# Patient Record
Sex: Male | Born: 1945 | Race: White | Hispanic: No | Marital: Married | Smoking: Former smoker
Health system: Southern US, Community
[De-identification: ages and names within clinical notes are randomized; demographics above are authoritative.]

## PROBLEM LIST (undated history)

## (undated) DIAGNOSIS — I10 Essential (primary) hypertension: Secondary | ICD-10-CM

## (undated) DIAGNOSIS — M109 Gout, unspecified: Secondary | ICD-10-CM

## (undated) DIAGNOSIS — Z9289 Personal history of other medical treatment: Secondary | ICD-10-CM

## (undated) DIAGNOSIS — J449 Chronic obstructive pulmonary disease, unspecified: Secondary | ICD-10-CM

## (undated) DIAGNOSIS — E119 Type 2 diabetes mellitus without complications: Secondary | ICD-10-CM

## (undated) DIAGNOSIS — C3411 Malignant neoplasm of upper lobe, right bronchus or lung: Secondary | ICD-10-CM

## (undated) DIAGNOSIS — F1010 Alcohol abuse, uncomplicated: Secondary | ICD-10-CM

## (undated) DIAGNOSIS — E7800 Pure hypercholesterolemia, unspecified: Secondary | ICD-10-CM

## (undated) HISTORY — PX: COLONOSCOPY: SHX174

## (undated) HISTORY — PX: NOSE SURGERY: SHX723

## (undated) HISTORY — DX: Personal history of other medical treatment: Z92.89

## (undated) HISTORY — DX: Chronic obstructive pulmonary disease, unspecified: J44.9

## (undated) HISTORY — DX: Type 2 diabetes mellitus without complications: E11.9

## (undated) HISTORY — DX: Gout, unspecified: M10.9

---

## 2001-02-28 ENCOUNTER — Emergency Department (HOSPITAL_COMMUNITY): Admission: EM | Admit: 2001-02-28 | Discharge: 2001-02-28 | Payer: Self-pay | Admitting: Emergency Medicine

## 2001-02-28 ENCOUNTER — Encounter: Payer: Self-pay | Admitting: Emergency Medicine

## 2002-04-09 ENCOUNTER — Encounter: Payer: Self-pay | Admitting: Emergency Medicine

## 2002-04-10 ENCOUNTER — Encounter: Payer: Self-pay | Admitting: Internal Medicine

## 2002-04-10 ENCOUNTER — Inpatient Hospital Stay (HOSPITAL_COMMUNITY): Admission: EM | Admit: 2002-04-10 | Discharge: 2002-04-16 | Payer: Self-pay | Admitting: Emergency Medicine

## 2002-04-14 ENCOUNTER — Encounter: Payer: Self-pay | Admitting: Internal Medicine

## 2002-06-05 ENCOUNTER — Encounter: Admission: RE | Admit: 2002-06-05 | Discharge: 2002-06-05 | Payer: Self-pay | Admitting: Family Medicine

## 2002-06-05 ENCOUNTER — Encounter: Payer: Self-pay | Admitting: Family Medicine

## 2003-02-09 ENCOUNTER — Encounter: Admission: RE | Admit: 2003-02-09 | Discharge: 2003-02-09 | Payer: Self-pay | Admitting: Internal Medicine

## 2003-02-09 ENCOUNTER — Encounter: Payer: Self-pay | Admitting: Internal Medicine

## 2003-02-10 ENCOUNTER — Encounter: Payer: Self-pay | Admitting: Internal Medicine

## 2003-02-10 ENCOUNTER — Encounter: Admission: RE | Admit: 2003-02-10 | Discharge: 2003-02-10 | Payer: Self-pay | Admitting: Internal Medicine

## 2003-09-30 ENCOUNTER — Encounter: Admission: RE | Admit: 2003-09-30 | Discharge: 2003-09-30 | Payer: Self-pay | Admitting: Family Medicine

## 2005-07-04 ENCOUNTER — Ambulatory Visit: Payer: Self-pay | Admitting: Internal Medicine

## 2005-07-28 ENCOUNTER — Encounter: Payer: Self-pay | Admitting: Internal Medicine

## 2005-08-21 ENCOUNTER — Inpatient Hospital Stay (HOSPITAL_COMMUNITY): Admission: EM | Admit: 2005-08-21 | Discharge: 2005-08-24 | Payer: Self-pay | Admitting: Emergency Medicine

## 2005-08-22 ENCOUNTER — Ambulatory Visit: Payer: Self-pay | Admitting: Internal Medicine

## 2005-08-28 ENCOUNTER — Other Ambulatory Visit (HOSPITAL_COMMUNITY): Admission: RE | Admit: 2005-08-28 | Discharge: 2005-09-07 | Payer: Self-pay | Admitting: Psychiatry

## 2005-08-28 ENCOUNTER — Ambulatory Visit: Payer: Self-pay | Admitting: Psychiatry

## 2005-08-29 ENCOUNTER — Ambulatory Visit: Payer: Self-pay | Admitting: Internal Medicine

## 2006-01-03 ENCOUNTER — Emergency Department (HOSPITAL_COMMUNITY): Admission: EM | Admit: 2006-01-03 | Discharge: 2006-01-03 | Payer: Self-pay | Admitting: Emergency Medicine

## 2006-01-03 ENCOUNTER — Emergency Department (HOSPITAL_COMMUNITY): Admission: EM | Admit: 2006-01-03 | Discharge: 2006-01-04 | Payer: Self-pay | Admitting: Emergency Medicine

## 2006-11-25 ENCOUNTER — Ambulatory Visit: Payer: Self-pay | Admitting: Internal Medicine

## 2006-11-28 ENCOUNTER — Ambulatory Visit: Payer: Self-pay | Admitting: Internal Medicine

## 2006-11-28 LAB — CONVERTED CEMR LAB
ALT: 79 units/L — ABNORMAL HIGH (ref 0–40)
Albumin: 3.9 g/dL (ref 3.5–5.2)
Alkaline Phosphatase: 49 units/L (ref 39–117)
BUN: 6 mg/dL (ref 6–23)
Basophils Absolute: 0.2 10*3/uL — ABNORMAL HIGH (ref 0.0–0.1)
Basophils Relative: 2.8 % — ABNORMAL HIGH (ref 0.0–1.0)
Calcium: 8.7 mg/dL (ref 8.4–10.5)
Chloride: 101 meq/L (ref 96–112)
Creatinine, Ser: 0.7 mg/dL (ref 0.4–1.5)
MCHC: 34 g/dL (ref 30.0–36.0)
Monocytes Absolute: 0.5 10*3/uL (ref 0.2–0.7)
Monocytes Relative: 7.4 % (ref 3.0–11.0)
Platelets: 141 10*3/uL — ABNORMAL LOW (ref 150–400)
Potassium: 3.2 meq/L — ABNORMAL LOW (ref 3.5–5.1)
RBC: 4.34 M/uL (ref 4.22–5.81)
RDW: 14.6 % (ref 11.5–14.6)
Total Bilirubin: 0.7 mg/dL (ref 0.3–1.2)

## 2006-12-13 ENCOUNTER — Ambulatory Visit: Payer: Self-pay | Admitting: Internal Medicine

## 2006-12-13 LAB — CONVERTED CEMR LAB
Folate: >20 ng/mL
Vitamin B-12: 819 pg/mL (ref 211–911)

## 2007-02-19 DIAGNOSIS — J449 Chronic obstructive pulmonary disease, unspecified: Secondary | ICD-10-CM

## 2007-02-19 DIAGNOSIS — I10 Essential (primary) hypertension: Secondary | ICD-10-CM | POA: Insufficient documentation

## 2007-02-19 DIAGNOSIS — J4489 Other specified chronic obstructive pulmonary disease: Secondary | ICD-10-CM | POA: Insufficient documentation

## 2007-02-19 DIAGNOSIS — F1021 Alcohol dependence, in remission: Secondary | ICD-10-CM | POA: Insufficient documentation

## 2007-02-21 ENCOUNTER — Encounter: Payer: Self-pay | Admitting: Internal Medicine

## 2007-02-25 ENCOUNTER — Ambulatory Visit: Payer: Self-pay | Admitting: Internal Medicine

## 2007-02-25 ENCOUNTER — Encounter: Payer: Self-pay | Admitting: Internal Medicine

## 2007-02-25 DIAGNOSIS — F1010 Alcohol abuse, uncomplicated: Secondary | ICD-10-CM | POA: Insufficient documentation

## 2007-02-25 DIAGNOSIS — I2699 Other pulmonary embolism without acute cor pulmonale: Secondary | ICD-10-CM | POA: Insufficient documentation

## 2007-02-25 DIAGNOSIS — G4700 Insomnia, unspecified: Secondary | ICD-10-CM

## 2007-02-25 DIAGNOSIS — F17200 Nicotine dependence, unspecified, uncomplicated: Secondary | ICD-10-CM

## 2007-06-05 ENCOUNTER — Ambulatory Visit: Payer: Self-pay | Admitting: Internal Medicine

## 2007-06-09 LAB — CONVERTED CEMR LAB
AST: 49 units/L — ABNORMAL HIGH (ref 0–37)
Basophils Relative: 0.2 % (ref 0.0–1.0)
Bilirubin, Direct: 0.1 mg/dL (ref 0.0–0.3)
Chloride: 103 meq/L (ref 96–112)
Creatinine, Ser: 0.9 mg/dL (ref 0.4–1.5)
Eosinophils Relative: 2.2 % (ref 0.0–5.0)
Glucose, Bld: 82 mg/dL (ref 70–99)
Hemoglobin: 15.1 g/dL (ref 13.0–17.0)
Lymphocytes Relative: 19.8 % (ref 12.0–46.0)
MCV: 100.5 fL — ABNORMAL HIGH (ref 78.0–100.0)
Monocytes Absolute: 1.3 10*3/uL — ABNORMAL HIGH (ref 0.2–0.7)
Monocytes Relative: 11.7 % — ABNORMAL HIGH (ref 3.0–11.0)
Neutro Abs: 7.5 10*3/uL (ref 1.4–7.7)
Sodium: 139 meq/L (ref 135–145)
TSH: 4.31 microintl units/mL (ref 0.35–5.50)
Total Bilirubin: 0.8 mg/dL (ref 0.3–1.2)
Total Protein: 8.2 g/dL (ref 6.0–8.3)
WBC: 11.2 10*3/uL — ABNORMAL HIGH (ref 4.5–10.5)

## 2007-06-16 ENCOUNTER — Encounter: Admission: RE | Admit: 2007-06-16 | Discharge: 2007-06-16 | Payer: Self-pay | Admitting: Internal Medicine

## 2007-09-08 ENCOUNTER — Ambulatory Visit: Payer: Self-pay | Admitting: Internal Medicine

## 2007-09-08 DIAGNOSIS — E1169 Type 2 diabetes mellitus with other specified complication: Secondary | ICD-10-CM | POA: Insufficient documentation

## 2007-09-08 DIAGNOSIS — E785 Hyperlipidemia, unspecified: Secondary | ICD-10-CM

## 2007-09-08 LAB — CONVERTED CEMR LAB
Nitrite: NEGATIVE
WBC Urine, dipstick: NEGATIVE
pH: 6

## 2007-09-15 LAB — CONVERTED CEMR LAB
Direct LDL: 174.1 mg/dL
PSA: 1.65 ng/mL (ref 0.10–4.00)
Triglycerides: 94 mg/dL (ref 0–149)

## 2007-11-12 ENCOUNTER — Ambulatory Visit: Payer: Self-pay | Admitting: Internal Medicine

## 2007-12-04 ENCOUNTER — Ambulatory Visit: Payer: Self-pay | Admitting: Internal Medicine

## 2007-12-09 ENCOUNTER — Telehealth (INDEPENDENT_AMBULATORY_CARE_PROVIDER_SITE_OTHER): Payer: Self-pay | Admitting: *Deleted

## 2007-12-22 ENCOUNTER — Telehealth: Payer: Self-pay | Admitting: Internal Medicine

## 2007-12-31 ENCOUNTER — Ambulatory Visit: Payer: Self-pay | Admitting: Internal Medicine

## 2008-01-01 ENCOUNTER — Telehealth: Payer: Self-pay | Admitting: Internal Medicine

## 2008-01-02 ENCOUNTER — Encounter: Payer: Self-pay | Admitting: Internal Medicine

## 2008-01-13 ENCOUNTER — Ambulatory Visit: Payer: Self-pay | Admitting: Internal Medicine

## 2008-02-05 ENCOUNTER — Telehealth (INDEPENDENT_AMBULATORY_CARE_PROVIDER_SITE_OTHER): Payer: Self-pay | Admitting: *Deleted

## 2008-02-09 ENCOUNTER — Telehealth: Payer: Self-pay | Admitting: Internal Medicine

## 2008-02-20 ENCOUNTER — Encounter: Payer: Self-pay | Admitting: Internal Medicine

## 2008-02-24 ENCOUNTER — Encounter (INDEPENDENT_AMBULATORY_CARE_PROVIDER_SITE_OTHER): Payer: Self-pay | Admitting: *Deleted

## 2008-03-11 ENCOUNTER — Ambulatory Visit: Payer: Self-pay | Admitting: Internal Medicine

## 2008-07-12 ENCOUNTER — Ambulatory Visit: Payer: Self-pay | Admitting: Internal Medicine

## 2008-07-13 ENCOUNTER — Telehealth (INDEPENDENT_AMBULATORY_CARE_PROVIDER_SITE_OTHER): Payer: Self-pay | Admitting: *Deleted

## 2008-07-13 LAB — CONVERTED CEMR LAB
Chloride: 101 meq/L (ref 96–112)
Creatinine, Ser: 0.8 mg/dL (ref 0.4–1.5)
GFR calc non Af Amer: 104 mL/min
Potassium: 2.9 meq/L — ABNORMAL LOW (ref 3.5–5.1)

## 2008-07-27 ENCOUNTER — Ambulatory Visit: Payer: Self-pay | Admitting: Internal Medicine

## 2008-08-03 ENCOUNTER — Encounter (INDEPENDENT_AMBULATORY_CARE_PROVIDER_SITE_OTHER): Payer: Self-pay | Admitting: *Deleted

## 2008-08-03 LAB — CONVERTED CEMR LAB
CO2: 29 meq/L (ref 19–32)
Calcium: 9.5 mg/dL (ref 8.4–10.5)
GFR calc non Af Amer: 104 mL/min
Sodium: 140 meq/L (ref 135–145)

## 2008-09-14 ENCOUNTER — Encounter (INDEPENDENT_AMBULATORY_CARE_PROVIDER_SITE_OTHER): Payer: Self-pay | Admitting: *Deleted

## 2008-09-14 ENCOUNTER — Ambulatory Visit: Payer: Self-pay | Admitting: Internal Medicine

## 2008-09-14 DIAGNOSIS — R935 Abnormal findings on diagnostic imaging of other abdominal regions, including retroperitoneum: Secondary | ICD-10-CM | POA: Insufficient documentation

## 2008-09-21 ENCOUNTER — Ambulatory Visit: Payer: Self-pay | Admitting: Internal Medicine

## 2008-09-23 ENCOUNTER — Encounter (INDEPENDENT_AMBULATORY_CARE_PROVIDER_SITE_OTHER): Payer: Self-pay | Admitting: *Deleted

## 2008-09-23 LAB — CONVERTED CEMR LAB
Alkaline Phosphatase: 29 units/L — ABNORMAL LOW (ref 39–117)
Basophils Absolute: 0 10*3/uL (ref 0.0–0.1)
Bilirubin, Direct: 0.1 mg/dL (ref 0.0–0.3)
Calcium: 9.8 mg/dL (ref 8.4–10.5)
Cholesterol: 223 mg/dL (ref 0–200)
Eosinophils Absolute: 0.2 10*3/uL (ref 0.0–0.7)
GFR calc Af Amer: 110 mL/min
GFR calc non Af Amer: 91 mL/min
HCT: 40.5 % (ref 39.0–52.0)
MCHC: 34.6 g/dL (ref 30.0–36.0)
MCV: 103 fL — ABNORMAL HIGH (ref 78.0–100.0)
Monocytes Absolute: 1.1 10*3/uL — ABNORMAL HIGH (ref 0.1–1.0)
PSA: 0.32 ng/mL (ref 0.10–4.00)
Platelets: 110 10*3/uL — ABNORMAL LOW (ref 150–400)
Potassium: 5.2 meq/L — ABNORMAL HIGH (ref 3.5–5.1)
RDW: 13.5 % (ref 11.5–14.6)
Sodium: 142 meq/L (ref 135–145)
TSH: 4.08 microintl units/mL (ref 0.35–5.50)
Total CHOL/HDL Ratio: 4.8
Triglycerides: 62 mg/dL (ref 0–149)

## 2008-09-28 ENCOUNTER — Ambulatory Visit: Payer: Self-pay | Admitting: Internal Medicine

## 2008-09-30 ENCOUNTER — Ambulatory Visit: Payer: Self-pay | Admitting: Internal Medicine

## 2008-10-04 ENCOUNTER — Encounter (INDEPENDENT_AMBULATORY_CARE_PROVIDER_SITE_OTHER): Payer: Self-pay | Admitting: *Deleted

## 2008-10-04 ENCOUNTER — Encounter: Admission: RE | Admit: 2008-10-04 | Discharge: 2008-10-04 | Payer: Self-pay | Admitting: Internal Medicine

## 2008-10-04 LAB — CONVERTED CEMR LAB: Fecal Occult Bld: NEGATIVE

## 2008-10-05 ENCOUNTER — Encounter: Payer: Self-pay | Admitting: Internal Medicine

## 2008-10-05 ENCOUNTER — Telehealth (INDEPENDENT_AMBULATORY_CARE_PROVIDER_SITE_OTHER): Payer: Self-pay | Admitting: *Deleted

## 2008-10-05 ENCOUNTER — Encounter: Admission: RE | Admit: 2008-10-05 | Discharge: 2008-10-05 | Payer: Self-pay | Admitting: Internal Medicine

## 2008-10-20 ENCOUNTER — Telehealth (INDEPENDENT_AMBULATORY_CARE_PROVIDER_SITE_OTHER): Payer: Self-pay | Admitting: *Deleted

## 2008-11-01 ENCOUNTER — Telehealth: Payer: Self-pay | Admitting: Internal Medicine

## 2008-11-03 ENCOUNTER — Ambulatory Visit: Payer: Self-pay | Admitting: Internal Medicine

## 2008-11-03 ENCOUNTER — Telehealth (INDEPENDENT_AMBULATORY_CARE_PROVIDER_SITE_OTHER): Payer: Self-pay | Admitting: *Deleted

## 2008-11-19 ENCOUNTER — Telehealth (INDEPENDENT_AMBULATORY_CARE_PROVIDER_SITE_OTHER): Payer: Self-pay | Admitting: *Deleted

## 2008-11-19 ENCOUNTER — Ambulatory Visit: Payer: Self-pay | Admitting: Internal Medicine

## 2008-11-19 LAB — CONVERTED CEMR LAB: Potassium: 4.2 meq/L (ref 3.5–5.1)

## 2008-12-10 ENCOUNTER — Telehealth (INDEPENDENT_AMBULATORY_CARE_PROVIDER_SITE_OTHER): Payer: Self-pay | Admitting: *Deleted

## 2009-03-21 ENCOUNTER — Ambulatory Visit: Payer: Self-pay | Admitting: Internal Medicine

## 2009-10-21 ENCOUNTER — Encounter (INDEPENDENT_AMBULATORY_CARE_PROVIDER_SITE_OTHER): Payer: Self-pay | Admitting: *Deleted

## 2009-11-23 ENCOUNTER — Telehealth (INDEPENDENT_AMBULATORY_CARE_PROVIDER_SITE_OTHER): Payer: Self-pay | Admitting: *Deleted

## 2010-05-31 ENCOUNTER — Encounter: Admission: RE | Admit: 2010-05-31 | Discharge: 2010-05-31 | Payer: Self-pay | Admitting: Family Medicine

## 2010-08-27 ENCOUNTER — Emergency Department (HOSPITAL_BASED_OUTPATIENT_CLINIC_OR_DEPARTMENT_OTHER): Admission: EM | Admit: 2010-08-27 | Discharge: 2010-08-27 | Payer: Self-pay | Admitting: Emergency Medicine

## 2010-08-29 ENCOUNTER — Emergency Department (HOSPITAL_BASED_OUTPATIENT_CLINIC_OR_DEPARTMENT_OTHER): Admission: EM | Admit: 2010-08-29 | Discharge: 2010-08-29 | Payer: Self-pay | Admitting: Emergency Medicine

## 2010-10-31 NOTE — Progress Notes (Signed)
Summary: Refill Requests  Phone Note Refill Request Message from:  Pharmacy on [address] [facility] [address] Fax #: [phone]  Refills Requested: Medication #1:  FELODIPINE 10 MG  TB24 1 by mouth once daily - NEEDS OFFICE VISIT FOR ADDITIONAL REFILLS   Dosage confirmed as above?Dosage Confirmed   Supply Requested: 1 month   Last Refilled: 10/21/2009  Medication #2:  BENAZEPRIL HCL 20 MG  TABS 1 by mouth once daily - NEEDS OFFICE VISIT FOR ADDITIONAL REFILLS   Dosage confirmed as above?Dosage Confirmed   Supply Requested: 1 month   Last Refilled: 10/21/2009 Initial call taken by: Harold Barban,  November 23, 2009 8:49 AM    Prescriptions: FELODIPINE 10 MG  TB24 (FELODIPINE) 1 by mouth once daily - NEEDS OFFICE VISIT FOR ADDITIONAL REFILLS  #15 x 0   Entered by:   Shary Decamp   Authorized by:   Nolon Rod. Paz MD   Signed by:   Shary Decamp on 11/23/2009   Method used:   Electronically to        [facility] [phone]* (retail)       [address]       [place], [place]  [address]       Ph: [phone]       Fax: [phone]   RxID:   [id number] BENAZEPRIL HCL 20 MG  TABS (BENAZEPRIL HCL) 1 by mouth once daily - NEEDS OFFICE VISIT FOR ADDITIONAL REFILLS  #15 x 0   Entered by:   Shary Decamp   Authorized by:   Nolon Rod. Paz MD   Signed by:   Shary Decamp on 11/23/2009   Method used:   Electronically to        [facility] [phone]* (retail)       [address]       [place], [place]  [address]       Ph: [phone]       Fax: [phone]   RxID:   [id number]

## 2010-10-31 NOTE — Letter (Signed)
Summary: Primary Care Appointment Letter  [facility] [place]  [address] [place], [place] [address]   Phone: [phone]  Fax: [phone]    10/21/2009 MRN: [id number]  Barry Booker [address] [place], [place]  [address]  Dear [name],   Your Primary Care Physician [place] E. Paz MD has indicated that:    ___X____it is time to schedule an appointment.  Please call our office @ [phone] to schedule a return office visit with Dr. Drue Novel.      Thank you,    [facility] Scheduler

## 2010-10-31 NOTE — Progress Notes (Signed)
Summary: pt is no longer a patient here  Phone Note Outgoing Call Call back at [facility] Phone [phone] Call back at Work Phone [phone]   Summary of Call: Barry Booker - I refilled his meds for 2 weeks only.  Patient needs to schedule office visit before we can give additional refills.   Shary Decamp  November 23, 2009 9:29 AM     Additional Follow-up for Phone Call Additional follow up Details #2::    LMTCB  PATIENT MOTHER STATED HE IS MEDICAID AND HE IS NOT A PATIENT OF DR PAZ ANYMORE. PATIENT HAD TO GET A NEW DOCTOR Follow-up by: Barb Merino,  November 23, 2009 9:36 AM

## 2010-12-12 DIAGNOSIS — K769 Liver disease, unspecified: Secondary | ICD-10-CM | POA: Insufficient documentation

## 2010-12-13 LAB — CBC
HCT: 44 % (ref 39.0–52.0)
Hemoglobin: 15.5 g/dL (ref 13.0–17.0)
MCH: 34.4 pg — ABNORMAL HIGH (ref 26.0–34.0)
MCHC: 35.2 g/dL (ref 30.0–36.0)

## 2010-12-13 LAB — DIFFERENTIAL
Eosinophils Relative: 2 % (ref 0–5)
Lymphocytes Relative: 21 % (ref 12–46)
Lymphs Abs: 2 10*3/uL (ref 0.7–4.0)
Monocytes Absolute: 0.9 10*3/uL (ref 0.1–1.0)
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
## 2011-06-05 DIAGNOSIS — F1010 Alcohol abuse, uncomplicated: Secondary | ICD-10-CM | POA: Insufficient documentation

## 2011-07-06 DIAGNOSIS — Z86711 Personal history of pulmonary embolism: Secondary | ICD-10-CM | POA: Insufficient documentation

## 2011-12-10 DIAGNOSIS — R799 Abnormal finding of blood chemistry, unspecified: Secondary | ICD-10-CM | POA: Insufficient documentation

## 2012-02-05 ENCOUNTER — Emergency Department (INDEPENDENT_AMBULATORY_CARE_PROVIDER_SITE_OTHER): Payer: Medicaid Other

## 2012-02-05 ENCOUNTER — Other Ambulatory Visit: Payer: Self-pay

## 2012-02-05 ENCOUNTER — Emergency Department (HOSPITAL_BASED_OUTPATIENT_CLINIC_OR_DEPARTMENT_OTHER)
Admission: EM | Admit: 2012-02-05 | Discharge: 2012-02-05 | Disposition: A | Discharge status: Home / Self Care | Payer: Medicaid Other | Attending: Emergency Medicine | Admitting: Emergency Medicine

## 2012-02-05 ENCOUNTER — Encounter (HOSPITAL_BASED_OUTPATIENT_CLINIC_OR_DEPARTMENT_OTHER): Payer: Self-pay | Admitting: *Deleted

## 2012-02-05 DIAGNOSIS — S2239XA Fracture of one rib, unspecified side, initial encounter for closed fracture: Secondary | ICD-10-CM

## 2012-02-05 DIAGNOSIS — K429 Umbilical hernia without obstruction or gangrene: Secondary | ICD-10-CM | POA: Insufficient documentation

## 2012-02-05 DIAGNOSIS — R109 Unspecified abdominal pain: Secondary | ICD-10-CM

## 2012-02-05 DIAGNOSIS — I10 Essential (primary) hypertension: Secondary | ICD-10-CM | POA: Insufficient documentation

## 2012-02-05 DIAGNOSIS — S2249XA Multiple fractures of ribs, unspecified side, initial encounter for closed fracture: Secondary | ICD-10-CM | POA: Insufficient documentation

## 2012-02-05 DIAGNOSIS — J9819 Other pulmonary collapse: Secondary | ICD-10-CM

## 2012-02-05 DIAGNOSIS — I700 Atherosclerosis of aorta: Secondary | ICD-10-CM

## 2012-02-05 DIAGNOSIS — R079 Chest pain, unspecified: Secondary | ICD-10-CM

## 2012-02-05 DIAGNOSIS — J439 Emphysema, unspecified: Secondary | ICD-10-CM

## 2012-02-05 DIAGNOSIS — R0602 Shortness of breath: Secondary | ICD-10-CM | POA: Insufficient documentation

## 2012-02-05 DIAGNOSIS — E7800 Pure hypercholesterolemia, unspecified: Secondary | ICD-10-CM | POA: Insufficient documentation

## 2012-02-05 DIAGNOSIS — N281 Cyst of kidney, acquired: Secondary | ICD-10-CM

## 2012-02-05 DIAGNOSIS — R61 Generalized hyperhidrosis: Secondary | ICD-10-CM

## 2012-02-05 DIAGNOSIS — N200 Calculus of kidney: Secondary | ICD-10-CM

## 2012-02-05 DIAGNOSIS — X58XXXA Exposure to other specified factors, initial encounter: Secondary | ICD-10-CM | POA: Insufficient documentation

## 2012-02-05 DIAGNOSIS — K7689 Other specified diseases of liver: Secondary | ICD-10-CM

## 2012-02-05 HISTORY — DX: Alcohol abuse, uncomplicated: F10.10

## 2012-02-05 HISTORY — DX: Pure hypercholesterolemia, unspecified: E78.00

## 2012-02-05 HISTORY — DX: Essential (primary) hypertension: I10

## 2012-02-05 LAB — TROPONIN I: Troponin I: <0.3 ng/mL (ref <0.30)

## 2012-02-05 LAB — DIFFERENTIAL
Basophils Absolute: 0 10*3/uL (ref 0.0–0.1)
Basophils Relative: 0 % (ref 0–1)
Lymphs Abs: 1.7 10*3/uL (ref 0.7–4.0)
Monocytes Relative: 9 % (ref 3–12)
Neutro Abs: 4.8 10*3/uL (ref 1.7–7.7)

## 2012-02-05 LAB — CBC
HCT: 42.9 % (ref 39.0–52.0)
MCV: 94.1 fL (ref 78.0–100.0)
RDW: 13.2 % (ref 11.5–15.5)
WBC: 7.2 10*3/uL (ref 4.0–10.5)

## 2012-02-05 LAB — COMPREHENSIVE METABOLIC PANEL
Albumin: 3.8 g/dL (ref 3.5–5.2)
BUN: 10 mg/dL (ref 6–23)
CO2: 26 mEq/L (ref 19–32)
Chloride: 102 mEq/L (ref 96–112)
Creatinine, Ser: 0.8 mg/dL (ref 0.50–1.35)
GFR calc Af Amer: >90 mL/min (ref >90)
GFR calc non Af Amer: >90 mL/min (ref >90)
Glucose, Bld: 141 mg/dL — ABNORMAL HIGH (ref 70–99)
Total Bilirubin: 1.1 mg/dL (ref 0.3–1.2)

## 2012-02-05 LAB — LIPASE, BLOOD: Lipase: 28 U/L (ref 11–59)

## 2012-02-05 MED ORDER — ONDANSETRON HCL 4 MG/2ML IJ SOLN
4.0000 mg | Freq: Once | INTRAMUSCULAR | Status: AC
  Administered 2012-02-05: 4 mg via INTRAVENOUS
  Filled 2012-02-05: qty 2

## 2012-02-05 MED ORDER — HYDROMORPHONE HCL PF 1 MG/ML IJ SOLN
1.0000 mg | Freq: Once | INTRAMUSCULAR | Status: AC
  Administered 2012-02-05: 1 mg via INTRAVENOUS
  Filled 2012-02-05: qty 1

## 2012-02-05 MED ORDER — IOHEXOL 350 MG/ML SOLN
80.0000 mL | Freq: Once | INTRAVENOUS | Status: AC | PRN
  Administered 2012-02-05: 80 mL via INTRAVENOUS

## 2012-02-05 MED ORDER — SODIUM CHLORIDE 0.9 % IV SOLN
INTRAVENOUS | Status: DC
  Administered 2012-02-05: 06:00:00 via INTRAVENOUS

## 2012-02-05 MED ORDER — HYDROCODONE-ACETAMINOPHEN 5-500 MG PO TABS: 1.0000 | ORAL_TABLET | Freq: Four times a day (QID) | ORAL | Status: AC | PRN

## 2012-02-05 NOTE — ED Notes (Signed)
Pt returned from CT °

## 2012-02-05 NOTE — ED Notes (Signed)
Pt reports RUQ pain x2 days. Constant in nature. Denies N/V/D. Mild SOB with exertion. Denies fevers.
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
## 2012-02-05 NOTE — ED Notes (Signed)
Dr. Steinl at bedside 

## 2012-02-05 NOTE — ED Notes (Signed)
Patient transported to X-ray 

## 2012-02-05 NOTE — ED Notes (Signed)
Report received from Talbert Nan, RN, care assumed.

## 2012-02-05 NOTE — ED Provider Notes (Addendum)
History     CSN: [id number]  Arrival date & time 02/05/12  0456   First MD Initiated Contact with Patient 02/05/12 0515      Chief Complaint  Patient presents with  . Abdominal Pain    (Consider location/radiation/quality/duration/timing/severity/associated sxs/prior treatment) Patient is a 66 y.o. male presenting with abdominal pain. The history is provided by the patient and the spouse.  Abdominal Pain The primary symptoms of the illness include abdominal pain. The primary symptoms of the illness do not include fever, nausea, vomiting or diarrhea.  Symptoms associated with the illness do not include chills, constipation or back pain.  pt c/o constant, sharp pain at right costal margin for past 2 days. Worse w movement, position changes, deep breaths. Denies hx same pain. Mild sob. No radiation of pain. No hx pud, gallstones or pancreatitis. Denies cough or uri c/o. No fever or chills. No hx cad. Denies chest wall trauma or strain. States had fallen last week when in yard, but says that wasn't related to onset pain. No leg pain or swelling. Remote hx pe approximately 8 yrs ago, states off coumadin for years.   Past Medical History  Diagnosis Date  . Hypertension   . High cholesterol     History reviewed. No pertinent past surgical history.  No family history on file.  History  Substance Use Topics  . Smoking status: Current Everyday Smoker  . Smokeless tobacco: Not on file  . Alcohol Use: Yes     drinks beer most days-approx 2      Review of Systems  Constitutional: Negative for fever and chills.  HENT: Negative for neck pain.   Eyes: Negative for redness.  Respiratory: Negative for cough.   Cardiovascular: Negative for leg swelling.  Gastrointestinal: Positive for abdominal pain. Negative for nausea, vomiting, diarrhea and constipation.  Genitourinary: Negative for flank pain.  Musculoskeletal: Negative for back pain.  Skin: Negative for rash.  Neurological:  Negative for headaches.  Hematological: Does not bruise/bleed easily.  Psychiatric/Behavioral: Negative for confusion.    Allergies  Neomycin-bacitracin zn-polymyx  Home Medications   Pt denies  BP 136/107  Pulse 98  Temp(Src) 98.6 F (37 C) (Oral)  Resp 22  Ht 5\' 10"  (1.778 m)  Wt 210 lb (95.255 kg)  BMI 30.13 kg/m2  SpO2 97%  Physical Exam  Nursing note and vitals reviewed. Constitutional: He is oriented to person, place, and time. He appears well-developed and well-nourished. No distress.  HENT:  Head: Atraumatic.  Eyes: Pupils are equal, round, and reactive to light.  Neck: Neck supple. No tracheal deviation present.  Cardiovascular: Normal rate, regular rhythm, normal heart sounds and intact distal pulses.  Exam reveals no gallop and no friction rub.   No murmur heard. Pulmonary/Chest: Effort normal and breath sounds normal. No accessory muscle usage. No respiratory distress.  Abdominal: Soft. Bowel sounds are normal. He exhibits no distension. There is tenderness. There is no rebound and no guarding.       Soft, non tender, easily reducible umbilical hernia. Tenderness right lower chest/costal margin/ruq, no crepitus.   Genitourinary:       No cva tenderness  Musculoskeletal: Normal range of motion. He exhibits no edema and no tenderness.  Neurological: He is alert and oriented to person, place, and time.  Skin: Skin is warm and dry.  Psychiatric: He has a normal mood and affect.    ED Course  Procedures (including critical care time)   Labs Reviewed  CBC  DIFFERENTIAL  COMPREHENSIVE METABOLIC PANEL  LIPASE, BLOOD  TROPONIN I    Results for orders placed during the hospital encounter of 02/05/12  CBC      Component Value Range   WBC 7.2  4.0 - 10.5 (K/uL)   RBC 4.56  4.22 - 5.81 (MIL/uL)   Hemoglobin 15.0  13.0 - 17.0 (g/dL)   HCT 16.1  09.6 - 04.5 (%)   MCV 94.1  78.0 - 100.0 (fL)   MCH 32.9  26.0 - 34.0 (pg)   MCHC 35.0  30.0 - 36.0 (g/dL)   RDW  40.9  81.1 - 91.4 (%)   Platelets 89 (*) 150 - 400 (K/uL)  DIFFERENTIAL      Component Value Range   Neutrophils Relative 65  43 - 77 (%)   Lymphocytes Relative 24  12 - 46 (%)   Monocytes Relative 9  3 - 12 (%)   Eosinophils Relative 2  0 - 5 (%)   Basophils Relative 0  0 - 1 (%)   Neutro Abs 4.8  1.7 - 7.7 (K/uL)   Lymphs Abs 1.7  0.7 - 4.0 (K/uL)   Monocytes Absolute 0.6  0.1 - 1.0 (K/uL)   Eosinophils Absolute 0.1  0.0 - 0.7 (K/uL)   Basophils Absolute 0.0  0.0 - 0.1 (K/uL)   Smear Review LARGE PLATELETS PRESENT    COMPREHENSIVE METABOLIC PANEL      Component Value Range   Sodium 139  135 - 145 (mEq/L)   Potassium 3.6  3.5 - 5.1 (mEq/L)   Chloride 102  96 - 112 (mEq/L)   CO2 26  19 - 32 (mEq/L)   Glucose, Bld 141 (*) 70 - 99 (mg/dL)   BUN 10  6 - 23 (mg/dL)   Creatinine, Ser 7.82  0.50 - 1.35 (mg/dL)   Calcium 9.5  8.4 - 95.6 (mg/dL)   Total Protein 7.8  6.0 - 8.3 (g/dL)   Albumin 3.8  3.5 - 5.2 (g/dL)   AST 41 (*) 0 - 37 (U/L)   ALT 30  0 - 53 (U/L)   Alkaline Phosphatase 44  39 - 117 (U/L)   Total Bilirubin 1.1  0.3 - 1.2 (mg/dL)   GFR calc non Af Amer >90  >90 (mL/min)   GFR calc Af Amer >90  >90 (mL/min)  LIPASE, BLOOD      Component Value Range   Lipase 28  11 - 59 (U/L)  TROPONIN I      Component Value Range   Troponin I <0.30  <0.30 (ng/mL)   Dg Chest 2 View  02/05/2012  *RADIOLOGY REPORT*  Clinical Data: Left upper abdominal pain, radiating into the chest. Shortness of breath and diaphoresis.  CHEST - 2 VIEW  Comparison: Chest radiograph performed 05/31/2010  Findings: The lungs are well-aerated.  There is no evidence of focal opacification, pleural effusion or pneumothorax.  The heart is normal in size; the mediastinal contour is within normal limits.  No acute osseous abnormalities are seen.  There is a stable chronic compression deformity at the upper lumbar spine.  IMPRESSION:  1.  No acute cardiopulmonary process seen. 2.  Stable chronic compression deformity  at the upper lumbar spine.  Original Report Authenticated By: Tonia Ghent, M.D.      MDM  Iv ns. Dilaudid 1 mg iv. zofran iv. Labs. Cxr.     Date: 02/05/2012  Rate: 89  Rhythm: normal sinus rhythm  QRS Axis: normal  Intervals: normal  ST/T Wave abnormalities: normal  Conduction Disutrbances:none  Narrative Interpretation:   Old EKG Reviewed: unchanged    Recheck pt more comfortable. No change in exam.   Ct discussed w pt incl rib fractures felt to be responsible for current pain, as well as small aaa, renal stone.   Recheck pt comfortable.       Suzi Roots, MD 02/05/12 0454  Suzi Roots, MD 02/05/12 [phone]

## 2012-02-05 NOTE — ED Notes (Signed)
Returned from XR. No change in pt condition.

## 2012-02-05 NOTE — ED Notes (Signed)
Pt also mentioned falling last week after mowing the yard. Pt does not remember the events in detail.

## 2012-02-05 NOTE — ED Notes (Signed)
Patient transported to CT 

## 2012-02-05 NOTE — ED Notes (Signed)
RRT at bedside for incentive spirometry teaching.

## 2012-07-29 DIAGNOSIS — Z135 Encounter for screening for eye and ear disorders: Secondary | ICD-10-CM | POA: Insufficient documentation

## 2012-12-25 DIAGNOSIS — L989 Disorder of the skin and subcutaneous tissue, unspecified: Secondary | ICD-10-CM | POA: Insufficient documentation

## 2013-06-22 DIAGNOSIS — I119 Hypertensive heart disease without heart failure: Secondary | ICD-10-CM | POA: Insufficient documentation

## 2013-07-07 DIAGNOSIS — J449 Chronic obstructive pulmonary disease, unspecified: Secondary | ICD-10-CM | POA: Insufficient documentation

## 2013-08-11 ENCOUNTER — Ambulatory Visit (INDEPENDENT_AMBULATORY_CARE_PROVIDER_SITE_OTHER): Payer: Medicare Other | Admitting: Podiatry

## 2013-08-11 ENCOUNTER — Encounter: Payer: Self-pay | Admitting: Podiatry

## 2013-08-11 VITALS — BP 146/80 | HR 110 | Resp 16 | Ht 71.0 in | Wt 210.0 lb

## 2013-08-11 DIAGNOSIS — B351 Tinea unguium: Secondary | ICD-10-CM

## 2013-08-11 DIAGNOSIS — M79609 Pain in unspecified limb: Secondary | ICD-10-CM

## 2013-08-11 NOTE — Progress Notes (Signed)
Tilton presents today with a chief complaint of painful toenails bilateral.  Objective: Pulses are palpable bilateral. Nails are thick yellow dystrophic clinically mycotic.  Assessment: Pain in limb secondary to onychomycosis bilateral.  Plan: Debridement of nails in thickness and length as a covered service secondary to pain. Followup with him in 3 months.

## 2013-10-15 ENCOUNTER — Ambulatory Visit: Payer: Medicare Other | Admitting: Podiatry

## 2013-10-22 ENCOUNTER — Encounter: Payer: Self-pay | Admitting: Podiatry

## 2013-10-22 ENCOUNTER — Ambulatory Visit (INDEPENDENT_AMBULATORY_CARE_PROVIDER_SITE_OTHER): Payer: Medicare Other | Admitting: Podiatry

## 2013-10-22 VITALS — BP 105/72 | HR 115 | Temp 97.2°F | Resp 18

## 2013-10-22 DIAGNOSIS — B353 Tinea pedis: Secondary | ICD-10-CM

## 2013-10-22 DIAGNOSIS — M79609 Pain in unspecified limb: Secondary | ICD-10-CM

## 2013-10-22 NOTE — Progress Notes (Signed)
Toenails , pt blood sugar was 137 milligrams per deciliter. This was not fasting. His complaining of dizziness.  Objective: Vital signs are stable he is alert and oriented x3 complaining of dizziness today. He is also complaining of some shortness of breath. Denies chest pain but was diaphoretic. Pulses are palpable bilateral lower extremity nails are thick yellow dystrophic onychomycotic and painful palpation.  Assessment: Pain in limb secondary to onychomycosis.  Plan: Debridement nails 1 through 5 bilateral is a covered service secondary to pain. I suggested that we called the EMS and having transported to the hospital. He states that his mother was with him and she was driving and we taken to the hospital. I disagree with this but he insisted. I will followup with him in a few months for another visit.

## 2014-01-19 DIAGNOSIS — E669 Obesity, unspecified: Secondary | ICD-10-CM | POA: Insufficient documentation

## 2014-01-21 ENCOUNTER — Encounter: Payer: Self-pay | Admitting: Podiatry

## 2014-01-21 ENCOUNTER — Ambulatory Visit (INDEPENDENT_AMBULATORY_CARE_PROVIDER_SITE_OTHER): Payer: Medicare Other | Admitting: Podiatry

## 2014-01-21 VITALS — BP 119/69 | HR 94 | Resp 12

## 2014-01-21 DIAGNOSIS — M79609 Pain in unspecified limb: Secondary | ICD-10-CM

## 2014-01-21 DIAGNOSIS — B351 Tinea unguium: Secondary | ICD-10-CM

## 2014-01-22 NOTE — Progress Notes (Signed)
He presents today chief complaint of painful elongated toenails one through 5 bilateral.  Objective: Vital signs are stable alert and oriented x3. Pulses are palpable bilateral. Nails are thick yellow dystrophic with mycotic and painful palpation.  Assessment: Pain in limb secondary to onychomycosis 1 through 5 bilateral.  Plan: Debridement of nails 1 through 5 bilateral covered service secondary to pain.

## 2014-01-26 ENCOUNTER — Ambulatory Visit: Payer: Medicare Other | Admitting: Podiatry

## 2014-01-29 DIAGNOSIS — Z136 Encounter for screening for cardiovascular disorders: Secondary | ICD-10-CM | POA: Insufficient documentation

## 2014-04-29 ENCOUNTER — Ambulatory Visit: Payer: Medicare Other | Admitting: Podiatry

## 2014-05-04 ENCOUNTER — Ambulatory Visit (INDEPENDENT_AMBULATORY_CARE_PROVIDER_SITE_OTHER): Payer: Medicare Other | Admitting: Podiatry

## 2014-05-04 ENCOUNTER — Encounter: Payer: Self-pay | Admitting: Podiatry

## 2014-05-04 DIAGNOSIS — B351 Tinea unguium: Secondary | ICD-10-CM

## 2014-05-04 DIAGNOSIS — M79676 Pain in unspecified toe(s): Secondary | ICD-10-CM

## 2014-05-04 DIAGNOSIS — M79609 Pain in unspecified limb: Secondary | ICD-10-CM

## 2014-05-04 NOTE — Progress Notes (Signed)
He presents today with a chief complaint of painful elongated toenails.  Objective: Nails are thick yellow dystrophic and mycotic and painful palpation.  Assessment: Pain in limb secondary to onychomycosis 1 through 5 bilateral.  Plan: Debridement of nails 1 through 5 bilateral covered service secondary to pain.

## 2014-06-22 DIAGNOSIS — R351 Nocturia: Secondary | ICD-10-CM | POA: Insufficient documentation

## 2014-07-27 ENCOUNTER — Ambulatory Visit (INDEPENDENT_AMBULATORY_CARE_PROVIDER_SITE_OTHER): Payer: Medicare Other | Admitting: Podiatry

## 2014-07-27 ENCOUNTER — Encounter: Payer: Self-pay | Admitting: Podiatry

## 2014-07-27 DIAGNOSIS — M79676 Pain in unspecified toe(s): Secondary | ICD-10-CM

## 2014-07-27 DIAGNOSIS — B351 Tinea unguium: Secondary | ICD-10-CM

## 2014-07-27 NOTE — Progress Notes (Signed)
Presents today chief complaint of painful elongated toenails.  Objective: Pulses are palpable bilateral nails are thick, yellow dystrophic onychomycosis and painful palpation.   Assessment: Onychomycosis with pain in limb.  Plan: Treatment of nails in thickness and length as covered service secondary to pain.  

## 2014-10-28 ENCOUNTER — Ambulatory Visit (INDEPENDENT_AMBULATORY_CARE_PROVIDER_SITE_OTHER): Payer: Medicare Other | Admitting: Podiatry

## 2014-10-28 DIAGNOSIS — B351 Tinea unguium: Secondary | ICD-10-CM

## 2014-10-28 DIAGNOSIS — M779 Enthesopathy, unspecified: Secondary | ICD-10-CM

## 2014-10-28 DIAGNOSIS — M79673 Pain in unspecified foot: Secondary | ICD-10-CM

## 2014-10-28 MED ORDER — TRIAMCINOLONE ACETONIDE 10 MG/ML IJ SUSP
10.0000 mg | Freq: Once | INTRAMUSCULAR | Status: AC
  Administered 2014-10-28: 10 mg

## 2014-10-28 NOTE — Progress Notes (Signed)
Subjective:     Patient ID: Barry Booker, male   DOB: [birth year], 69 y.o.   MRN: [id number]  HPI patient presents with thick painful nailbeds 1-5 both feet that he cannot cut and presents with inflammation the left ankle that's been making it hard to walk   Review of Systems     Objective:   Physical Exam Neurovascular status intact with thick yellow brittle nailbeds 1-5 of both feet and pain in the left sinus tarsi with fluid buildup noted    Assessment:     Chronic mycotic nail infections with pain 1-5 both feet and inflammatory capsulitis left    Plan:     H&P performed and today debrided nailbeds 1-5 both feet with no iatrogenic bleeding noted and injected the left sinus tarsi 3 mg Kenalog 5 mg Xylocaine was tolerated well

## 2015-01-07 DIAGNOSIS — E119 Type 2 diabetes mellitus without complications: Secondary | ICD-10-CM | POA: Insufficient documentation

## 2015-02-03 ENCOUNTER — Ambulatory Visit (INDEPENDENT_AMBULATORY_CARE_PROVIDER_SITE_OTHER): Payer: Medicare Other

## 2015-02-03 DIAGNOSIS — M79673 Pain in unspecified foot: Secondary | ICD-10-CM | POA: Diagnosis not present

## 2015-02-03 DIAGNOSIS — B351 Tinea unguium: Secondary | ICD-10-CM | POA: Diagnosis not present

## 2015-02-04 NOTE — Progress Notes (Signed)
Presents today chief complaint of painful elongated toenails.  Objective: Pulses are palpable bilateral nails are thick, yellow dystrophic onychomycosis and painful palpation.   Assessment: Onychomycosis with pain in limb.  Plan: Treatment of nails in thickness and length as covered service secondary to pain.  

## 2015-04-21 ENCOUNTER — Ambulatory Visit (INDEPENDENT_AMBULATORY_CARE_PROVIDER_SITE_OTHER): Payer: Medicare Other | Admitting: Podiatry

## 2015-04-21 DIAGNOSIS — M79676 Pain in unspecified toe(s): Secondary | ICD-10-CM

## 2015-04-21 DIAGNOSIS — B351 Tinea unguium: Secondary | ICD-10-CM | POA: Diagnosis not present

## 2015-04-21 NOTE — Progress Notes (Signed)
Patient ID: Barry Booker, male   DOB: 1946/06/11, 69 y.o.   MRN: [id number] Complaint:  Visit Type: Patient returns to my office for continued preventative foot care services. Complaint: Patient states" my nails have grown long and thick and become painful to walk and wear shoes" . The patient presents for preventative foot care services. No changes to ROS  Podiatric Exam: Vascular: dorsalis pedis and posterior tibial pulses are palpable bilateral. Capillary return is immediate. Temperature gradient is WNL. Skin turgor WNL  Sensorium: Normal Semmes Weinstein monofilament test. Normal tactile sensation bilaterally. Nail Exam: Pt has thick disfigured discolored nails with subungual debris noted bilateral entire nail hallux through fifth toenails Ulcer Exam: There is no evidence of ulcer or pre-ulcerative changes or infection. Orthopedic Exam: Muscle tone and strength are WNL. No limitations in general ROM. No crepitus or effusions noted. Foot type and digits show no abnormalities. Bony prominences are unremarkable. Skin: No Porokeratosis. No infection or ulcers  Diagnosis:  Onychomycosis, , Pain in right toe, pain in left toes  Treatment & Plan Procedures and Treatment: Consent by patient was obtained for treatment procedures. The patient understood the discussion of treatment and procedures well. All questions were answered thoroughly reviewed. Debridement of mycotic and hypertrophic toenails, 1 through 5 bilateral and clearing of subungual debris. No ulceration, no infection noted.  Return Visit-Office Procedure: Patient instructed to return to the office for a follow up visit 3 months for continued evaluation and treatment.

## 2015-06-28 DIAGNOSIS — R600 Localized edema: Secondary | ICD-10-CM | POA: Insufficient documentation

## 2015-07-05 DIAGNOSIS — R195 Other fecal abnormalities: Secondary | ICD-10-CM | POA: Insufficient documentation

## 2015-07-28 ENCOUNTER — Encounter: Payer: Self-pay | Admitting: Podiatry

## 2015-07-28 ENCOUNTER — Ambulatory Visit (INDEPENDENT_AMBULATORY_CARE_PROVIDER_SITE_OTHER): Payer: Medicare Other | Admitting: Podiatry

## 2015-07-28 DIAGNOSIS — M79676 Pain in unspecified toe(s): Secondary | ICD-10-CM

## 2015-07-28 DIAGNOSIS — B351 Tinea unguium: Secondary | ICD-10-CM | POA: Diagnosis not present

## 2015-07-28 NOTE — Progress Notes (Signed)
Patient ID: Barry Booker, male   DOB: Jan 23, 1946, 69 y.o.   MRN: [id number] Complaint:  Visit Type: Patient returns to my office for continued preventative foot care services. Complaint: Patient states" my nails have grown long and thick and become painful to walk and wear shoes" . The patient presents for preventative foot care services. No changes to ROS  Podiatric Exam: Vascular: dorsalis pedis and posterior tibial pulses are palpable bilateral. Capillary return is immediate. Temperature gradient is WNL. Skin turgor WNL  Sensorium: Normal Semmes Weinstein monofilament test. Normal tactile sensation bilaterally. Nail Exam: Pt has thick disfigured discolored nails with subungual debris noted bilateral entire nail hallux through fifth toenails Ulcer Exam: There is no evidence of ulcer or pre-ulcerative changes or infection. Orthopedic Exam: Muscle tone and strength are WNL. No limitations in general ROM. No crepitus or effusions noted. Foot type and digits show no abnormalities. Bony prominences are unremarkable. Skin: No Porokeratosis. No infection or ulcers  Diagnosis:  Onychomycosis, , Pain in right toe, pain in left toes  Treatment & Plan Procedures and Treatment: Consent by patient was obtained for treatment procedures. The patient understood the discussion of treatment and procedures well. All questions were answered thoroughly reviewed. Debridement of mycotic and hypertrophic toenails, 1 through 5 bilateral and clearing of subungual debris. No ulceration, no infection noted.  Return Visit-Office Procedure: Patient instructed to return to the office for a follow up visit 3 months for continued evaluation and treatment.

## 2015-08-02 ENCOUNTER — Encounter: Payer: Self-pay | Admitting: Gastroenterology

## 2015-09-28 ENCOUNTER — Ambulatory Visit (AMBULATORY_SURGERY_CENTER): Payer: Self-pay | Admitting: *Deleted

## 2015-09-28 VITALS — Ht 71.0 in | Wt 248.0 lb

## 2015-09-28 DIAGNOSIS — Z1211 Encounter for screening for malignant neoplasm of colon: Secondary | ICD-10-CM

## 2015-09-28 MED ORDER — NA SULFATE-K SULFATE-MG SULF 17.5-3.13-1.6 GM/177ML PO SOLN: ORAL | Status: DC

## 2015-09-28 NOTE — Progress Notes (Signed)
Patient denies any allergies to eggs or soy. Patient denies any problems with anesthesia/sedation. Patient has COPD. Patient denies any oxygen use at home and does not take any diet/weight loss medications. Patient declined EMMI information.

## 2015-10-12 ENCOUNTER — Telehealth: Payer: Self-pay | Admitting: Gastroenterology

## 2015-10-12 ENCOUNTER — Encounter: Payer: Self-pay | Admitting: Gastroenterology

## 2015-10-12 NOTE — Telephone Encounter (Signed)
Due to weather this week, it's okay to not charge him. Thanks for asking

## 2015-10-26 ENCOUNTER — Ambulatory Visit: Payer: Medicare Other | Admitting: Podiatry

## 2015-10-28 ENCOUNTER — Ambulatory Visit: Payer: Medicare Other | Admitting: Podiatry

## 2015-11-03 ENCOUNTER — Encounter: Payer: Self-pay | Admitting: Podiatry

## 2015-11-03 ENCOUNTER — Ambulatory Visit (INDEPENDENT_AMBULATORY_CARE_PROVIDER_SITE_OTHER): Payer: Medicare HMO | Admitting: Podiatry

## 2015-11-03 DIAGNOSIS — M129 Arthropathy, unspecified: Secondary | ICD-10-CM

## 2015-11-03 DIAGNOSIS — M79676 Pain in unspecified toe(s): Secondary | ICD-10-CM

## 2015-11-03 DIAGNOSIS — M19079 Primary osteoarthritis, unspecified ankle and foot: Secondary | ICD-10-CM

## 2015-11-03 DIAGNOSIS — B351 Tinea unguium: Secondary | ICD-10-CM | POA: Diagnosis not present

## 2015-11-03 MED ORDER — OXAPROZIN 600 MG PO TABS: 600.0000 mg | ORAL_TABLET | Freq: Two times a day (BID) | ORAL | Status: DC

## 2015-11-03 NOTE — Progress Notes (Signed)
Patient ID: Barry Booker, male   DOB: [birth year], 70 y.o.   MRN: [id number] Complaint:  Visit Type: Patient returns to my office for continued preventative foot care services. Complaint: Patient states" my nails have grown long and thick and become painful to walk and wear shoes" . The patient presents for preventative foot care services. No changes to ROS.  Patient also says his right foot has become increasingly painful and swollen for the last few days.  He says the started upon rising on Wednesday and has become increasingly painful.  He now uses his cane to ambulate.  He says he had similar episode over  one year ago which improved with no medical treatment.  Podiatric Exam: Vascular: dorsalis pedis and posterior tibial pulses are palpable bilateral. Capillary return is immediate. Temperature gradient is WNL. Skin turgor WNL  Sensorium: Normal Semmes Weinstein monofilament test. Normal tactile sensation bilaterally. Nail Exam: Pt has thick disfigured discolored nails with subungual debris noted bilateral entire nail hallux through fifth toenails Ulcer Exam: There is no evidence of ulcer or pre-ulcerative changes or infection. Orthopedic Exam: Muscle tone and strength are WNL. No limitations in general ROM. No crepitus or effusions noted. Foot type and digits show no abnormalities. Bony prominences are unremarkable. Red swollen painful right foot through his whole foot.  No streaking noted.   Skin: No Porokeratosis. No infection or ulcers  Diagnosis:  Onychomycosis, , Pain in right toe, pain in left toes, gout possible right foot.  Treatment & Plan Procedures and Treatment: Consent by patient was obtained for treatment procedures. The patient understood the discussion of treatment and procedures well. All questions were answered thoroughly reviewed. Debridement of mycotic and hypertrophic toenails, 1 through 5 bilateral and clearing of subungual debris. No ulceration, no infection noted.  Prescribed Daypro # 30 one bid. Return Visit-Office Procedure: Patient instructed to return to the office for a follow up visit 3 months for continued evaluation and treatment. If no improvement on medicine , he needs to schedule an appointment at [facility] for an x-ray study.  Gardiner Barefoot DPM

## 2015-11-03 NOTE — Addendum Note (Signed)
Addended by: Ezzard Flax, Kaydan Wong L on: 11/03/2015 09:32 AM   Modules accepted: Orders

## 2015-11-17 ENCOUNTER — Ambulatory Visit (AMBULATORY_SURGERY_CENTER): Payer: Self-pay | Admitting: *Deleted

## 2015-11-17 VITALS — Ht 70.0 in | Wt 244.0 lb

## 2015-11-17 DIAGNOSIS — Z1211 Encounter for screening for malignant neoplasm of colon: Secondary | ICD-10-CM

## 2015-11-17 NOTE — Progress Notes (Signed)
No egg or soy allergy. No anesthesia problems.  No home O2.  No diet meds.  

## 2015-11-30 ENCOUNTER — Encounter: Payer: Self-pay | Admitting: Gastroenterology

## 2015-11-30 ENCOUNTER — Ambulatory Visit (AMBULATORY_SURGERY_CENTER): Payer: Medicare HMO | Admitting: Gastroenterology

## 2015-11-30 VITALS — BP 130/87 | HR 74 | Temp 98.0°F | Resp 9 | Ht 70.0 in | Wt 244.0 lb

## 2015-11-30 DIAGNOSIS — Z1211 Encounter for screening for malignant neoplasm of colon: Secondary | ICD-10-CM | POA: Diagnosis not present

## 2015-11-30 DIAGNOSIS — D122 Benign neoplasm of ascending colon: Secondary | ICD-10-CM | POA: Diagnosis not present

## 2015-11-30 DIAGNOSIS — D120 Benign neoplasm of cecum: Secondary | ICD-10-CM | POA: Diagnosis not present

## 2015-11-30 DIAGNOSIS — D123 Benign neoplasm of transverse colon: Secondary | ICD-10-CM

## 2015-11-30 DIAGNOSIS — D125 Benign neoplasm of sigmoid colon: Secondary | ICD-10-CM

## 2015-11-30 LAB — GLUCOSE, CAPILLARY
GLUCOSE-CAPILLARY: 127 mg/dL — AB (ref 65–99)
Glucose-Capillary: 121 mg/dL — ABNORMAL HIGH (ref 65–99)

## 2015-11-30 MED ORDER — SODIUM CHLORIDE 0.9 % IV SOLN: 500.0000 mL | INTRAVENOUS | Status: DC

## 2015-11-30 NOTE — Patient Instructions (Addendum)
YOU HAD AN ENDOSCOPIC PROCEDURE TODAY AT THE [facility]:   Refer to the procedure report that was given to you for any specific questions about what was found during the examination.  If the procedure report does not answer your questions, please call your gastroenterologist to clarify.  If you requested that your care partner not be given the details of your procedure findings, then the procedure report has been included in a sealed envelope for you to review at your convenience later.  YOU SHOULD EXPECT: Some feelings of bloating in the abdomen. Passage of more gas than usual.  Walking can help get rid of the air that was put into your GI tract during the procedure and reduce the bloating. If you had a lower endoscopy (such as a colonoscopy or flexible sigmoidoscopy) you may notice spotting of blood in your stool or on the toilet paper. If you underwent a bowel prep for your procedure, you may not have a normal bowel movement for a few days.  Please Note:  You might notice some irritation and congestion in your nose or some drainage.  This is from the oxygen used during your procedure.  There is no need for concern and it should clear up in a day or so.  SYMPTOMS TO REPORT IMMEDIATELY:   Following lower endoscopy (colonoscopy or flexible sigmoidoscopy):  Excessive amounts of blood in the stool  Significant tenderness or worsening of abdominal pains  Swelling of the abdomen that is new, acute  Fever of 100F or higher   Following upper endoscopy (EGD)  Vomiting of blood or coffee ground material  New chest pain or pain under the shoulder blades  Painful or persistently difficult swallowing  New shortness of breath  Fever of 100F or higher  Black, tarry-looking stools  For urgent or emergent issues, a gastroenterologist can be reached at any hour by calling [phone].   DIET: Your first meal following the procedure should be a small meal and then it is ok to progress to  your normal diet. Heavy or fried foods are harder to digest and may make you feel nauseous or bloated.  Likewise, meals heavy in dairy and vegetables can increase bloating.  Drink plenty of fluids but you should avoid alcoholic beverages for 24 hours.  ACTIVITY:  You should plan to take it easy for the rest of today and you should NOT DRIVE or use heavy machinery until tomorrow (because of the sedation medicines used during the test).    FOLLOW UP: Our staff will call the number listed on your records the next business day following your procedure to check on you and address any questions or concerns that you may have regarding the information given to you following your procedure. If we do not reach you, we will leave a message.  However, if you are feeling well and you are not experiencing any problems, there is no need to return our call.  We will assume that you have returned to your regular daily activities without incident.  If any biopsies were taken you will be contacted by phone or by letter within the next 1-3 weeks.  Please call us at [phone] if you have not heard about the biopsies in 3 weeks.    SIGNATURES/CONFIDENTIALITY: You and/or your care partner have signed paperwork which will be entered into your electronic medical record.  These signatures attest to the fact that that the information above on your After Visit Summary has been reviewed   and is understood.  Full responsibility of the confidentiality of this discharge information lies with you and/or your care-partner.YOU HAD AN ENDOSCOPIC PROCEDURE TODAY AT [facility]:   Refer to the procedure report that was given to you for any specific questions about what was found during the examination.  If the procedure report does not answer your questions, please call your gastroenterologist to clarify.  If you requested that your care partner not be given the details of your procedure findings, then the procedure  report has been included in a sealed envelope for you to review at your convenience later.  YOU SHOULD EXPECT: Some feelings of bloating in the abdomen. Passage of more gas than usual.  Walking can help get rid of the air that was put into your GI tract during the procedure and reduce the bloating. If you had a lower endoscopy (such as a colonoscopy or flexible sigmoidoscopy) you may notice spotting of blood in your stool or on the toilet paper. If you underwent a bowel prep for your procedure, you may not have a normal bowel movement for a few days.  Please Note:  You might notice some irritation and congestion in your nose or some drainage.  This is from the oxygen used during your procedure.  There is no need for concern and it should clear up in a day or so.  SYMPTOMS TO REPORT IMMEDIATELY:   Following lower endoscopy (colonoscopy or flexible sigmoidoscopy):  Excessive amounts of blood in the stool  Significant tenderness or worsening of abdominal pains  Swelling of the abdomen that is new, acute  Fever of 100F or higher   For urgent or emergent issues, a gastroenterologist can be reached at any hour by calling [phone].   DIET: Your first meal following the procedure should be a small meal and then it is ok to progress to your normal diet. Heavy or fried foods are harder to digest and may make you feel nauseous or bloated.  Likewise, meals heavy in dairy and vegetables can increase bloating.  Drink plenty of fluids but you should avoid alcoholic beverages for 24 hours. Increase the fiber in your diet. ACTIVITY:  You should plan to take it easy for the rest of today and you should NOT DRIVE or use heavy machinery until tomorrow (because of the sedation medicines used during the test).    FOLLOW UP: Our staff will call the number listed on your records the next business day following your procedure to check on you and address any questions or concerns that you may have regarding the  information given to you following your procedure. If we do not reach you, we will leave a message.  However, if you are feeling well and you are not experiencing any problems, there is no need to return our call.  We will assume that you have returned to your regular daily activities without incident.  If any biopsies were taken you will be contacted by phone or by letter within the next 1-3 weeks.  Please call us at [phone] if you have not heard about the biopsies in 3 weeks.  No NSAIDS for 2 weeks.   SIGNATURES/CONFIDENTIALITY: You and/or your care partner have signed paperwork which will be entered into your electronic medical record.  These signatures attest to the fact that that the information above on your After Visit Summary has been reviewed and is understood.  Full responsibility of the confidentiality of this discharge information lies with  you and/or your care-partner.  Please read all your handouts given to you by your recovery nurse. Thank you for letting us take care of your healthcare needs.

## 2015-11-30 NOTE — Progress Notes (Signed)
A/ox3, pleased with MAC, report to RN 

## 2015-11-30 NOTE — Progress Notes (Signed)
Called to room to assist during endoscopic procedure.  Patient ID and intended procedure confirmed with present staff. Received instructions for my participation in the procedure from the performing physician.  

## 2015-11-30 NOTE — Op Note (Signed)
[facility]  [address] [place], [address]   COLONOSCOPY PROCEDURE REPORT  PATIENT: Barry Booker, Barry Booker  MR#: [id number] BIRTHDATE: [birth year] , 56  yrs. old GENDER: male ENDOSCOPIST: Yetta Flock, MD REFERRED BY:  Bernerd Limbo MD PROCEDURE DATE:  11/30/2015 PROCEDURE:   Colonoscopy, screening, Colonoscopy with snare polypectomy, and Colonoscopy with biopsy First Screening Colonoscopy - Avg.  risk and is 50 yrs.  old or older - No.  Prior Negative Screening - Now for repeat screening. [name]  History of Adenoma - Now for follow-up colonoscopy & has been > or = to 3 yrs.  [name]  Polyps removed today? Yes ASA CLASS:   Class III INDICATIONS:Screening for colonic neoplasia and Colorectal Neoplasm Risk Assessment for this procedure is average risk. MEDICATIONS: Propofol 450 mg IV  DESCRIPTION OF PROCEDURE:   After the risks benefits and alternatives of the procedure were thoroughly explained, informed consent was obtained.  The digital rectal exam revealed no abnormalities of the rectum.   The [id number]  endoscope was introduced through the anus and advanced to the cecum, which was identified by both the appendix and ileocecal valve. No adverse events experienced.   The quality of the prep was adequate  The instrument was then slowly withdrawn as the colon was fully examined. Estimated blood loss is zero unless otherwise noted in this procedure report.      COLON FINDINGS: The bowel prep was only fair on intubation however following significant lavage the views were adequate for screening purposes.  A 1m sessile polyp was noted in the cecum and removed with cold snare.  A 646msessile ascending colon polyp was noted and removed with cold snare.  A 28m21messile polyp in the ascending colon was noted and removed with cold forceps.  Three sessile polyps noted in the transverse colon ranging from 4-29m18mre noted and removed with cold snare.  Another 28mm 57mssile polyp was noted in the transverse colon and removed with cold snare.  A 5mm s68mile polyp was noted in the splenic flexure and removed with cold snare. A roughly 8mm se1mle polyp was noted in the sigmoid colon but could not cut through with cold snare.  A short burst of cautery was used to resect it with hot snare.  Mild diverticulosis was noted in the sigmoid colon.  Retroflexed views revealed internal hemorrhoids. The time to cecum = 6.5 (including polypectomy) Withdrawal time = 20.6   The scope was withdrawn and the procedure completed. COMPLICATIONS: There were no immediate complications.  ENDOSCOPIC IMPRESSION: Mild diverticulosis 9 colon polyps removed as outlined above  RECOMMENDATIONS: Await pathology results No NSAIDs for 2 weeks Resume diet Resume medication  eSigned:  Steven Yetta Flock/10/2015 9:45 AM   cc: David BBernerd Limboe patient   PATIENT NAME:  Gambrel, Zandyr, [id number]

## 2015-12-01 ENCOUNTER — Telehealth: Payer: Self-pay

## 2015-12-01 NOTE — Telephone Encounter (Signed)
  Follow up Call-  Call back number 11/30/2015  Post procedure Call Back phone  # [phone]  Permission to leave phone message Yes     Patient questions:  Do you have a fever, pain , or abdominal swelling? No. Pain Score  0 *  Have you tolerated food without any problems? Yes.    Have you been able to return to your normal activities? Yes.    Do you have any questions about your discharge instructions: Diet   No. Medications  No. Follow up visit  No.  Do you have questions or concerns about your Care? No.  Actions: * If pain score is 4 or above: No action needed, pain <4.

## 2015-12-06 ENCOUNTER — Encounter: Payer: Self-pay | Admitting: Gastroenterology

## 2015-12-19 DIAGNOSIS — R0609 Other forms of dyspnea: Secondary | ICD-10-CM | POA: Insufficient documentation

## 2016-01-11 DIAGNOSIS — Z1159 Encounter for screening for other viral diseases: Secondary | ICD-10-CM | POA: Insufficient documentation

## 2016-01-11 NOTE — Progress Notes (Signed)
Cardiology Office Note  NEW PATIENT  Date:  01/12/2016   ID:  Barry Booker, DOB 1946/08/12, MRN [id number]  PCP:  Phineas Inches, MD  Cardiologist:  NEW- Dr. Lovena Le  Chief Complaint  Patient presents with  . Leg Swelling    DOE      History of Present Illness: Barry Booker is a 70 y.o. male who presents for edema.  He has been placed on lasix 40 BID with some improvement of edema, he did do 3 days at 80 mg twice a day. but wt still climbing.  Pt denies any increase of DOE.  He sleeps in his bed and feels the edema is worse in the AM.  He does admit to eating salty foods and has been instructed by Dr. Coletta Memos to decrease.  Also drinks about a case of beer a week I discussed increased salt in Beer and to try to decrease this as well.  He only has second or two of sharp shooting chest pain, like muscle spasm.   Previous CT of chest with coronary calcifications but pt without chest pain.   He is not very active watches TV all day.  He is diabetic and watches diet "some" .  He smokes about 2 pk per week.     Past Medical History  Diagnosis Date  . Hypertension   . High cholesterol   . Alcohol abuse   . Diabetes mellitus without complication ([place])   . COPD (chronic obstructive pulmonary disease) ([place])   . Gout     Past Surgical History  Procedure Laterality Date  . Nose surgery  90's  . Colonoscopy       Current Outpatient Prescriptions  Medication Sig Dispense Refill  . aspirin 81 MG tablet Take 81 mg by mouth daily.    [name] atorvastatin (LIPITOR) 40 MG tablet Take 40 mg by mouth daily.     . benazepril (LOTENSIN) 20 MG tablet Take 20 mg by mouth daily.     . Calcium Carbonate (CALTRATE 600 PO) Take by mouth.    . FOLIC ACID PO Take 1 tablet by mouth daily.     . furosemide (LASIX) 20 MG tablet Take 80 mg by mouth daily.     . metFORMIN (GLUCOPHAGE) 500 MG tablet Take 500 mg by mouth daily with breakfast.     . hydrALAZINE (APRESOLINE) 10 MG tablet Take 1 tablet (10  mg total) by mouth 2 (two) times daily. 60 tablet 0   No current facility-administered medications for this visit.    Allergies:   Neomycin-bacitracin zn-polymyx    Social History:  The patient  reports that he has been smoking Cigarettes.  He has a 12.5 pack-year smoking history. He has never used smokeless tobacco. He reports that he drinks about 21.0 oz of alcohol per week. He reports that he does not use illicit drugs.   Family History:  The patient's family history includes Arthritis in his mother; Cancer in his father and sister; Heart disease in his maternal uncle. There is no history of Colon cancer.    ROS:  General:no colds or fevers, + weight gain Skin:no rashes or ulcers HEENT:no blurred vision, no congestion CV:see HPI PUL:see HPI GI:no diarrhea constipation or melena, no indigestion GU:no hematuria, no dysuria MS:no joint pain, no claudication Neuro:no syncope, no lightheadedness Endo:+ diabetes, no thyroid disease  Wt Readings from Last 3 Encounters:  01/12/16 249 lb 12.8 oz (113.309 kg)  11/30/15 244 lb (110.678 kg)  11/17/15  244 lb (110.678 kg)     PHYSICAL EXAM: VS:  BP 124/68 mmHg  Pulse 92  Ht '5\' 10"'$  (1.778 m)  Wt 249 lb 12.8 oz (113.309 kg)  BMI 35.84 kg/m2  SpO2 95% , BMI Body mass index is 35.84 kg/(m^2). General:Pleasant affect, NAD Skin:Warm and dry, brisk capillary refill HEENT:normocephalic, sclera clear, mucus membranes moist Neck:supple, no JVD, no bruits  Heart:S1S2 RRR without murmur, gallup, rub or click Lungs:without rales, rhonchi, + insp and exp wheezes.  WGN:FAOZH, soft, non tender, + BS, do not palpate liver spleen or masses Ext: + lower ext edema more at the ankle , 2+ radial pulses Neuro:alert and oriented X 3, MAE, follows commands, + facial symmetry    EKG:  EKG is ordered today. The ekg ordered today demonstrates SR normal EKG no changes from 2013.    Recent Labs: No results found for requested labs within last 365 days.     Na 139, K+ 5.4, cl 97 BUN 11, Cr 0.75, GFR > 90 glucose 151, AST 50 ALT 43  TSH 5.120  Wbc 9.5, plt 144 hgb 13 hct 44 BNP 8.3 normal.   Lipid Panel    Component Value Date/Time   CHOL 223* 09/21/2008 0933   TRIG 62 09/21/2008 0933   HDL 46.0 09/21/2008 0933   CHOLHDL 4.8 CALC 09/21/2008 0933   VLDL 12 09/21/2008 0933   LDLDIRECT 167.8 09/21/2008 0933       Other studies Reviewed: Additional studies/ records that were reviewed today include: previous imaging. Dr. Chales Abrahams notes and labs.   IMPRESSION:  1. No acute abnormality seen within the abdomen. 2. 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 3. Mildly nodular contour of the liver, possibly reflecting mild cirrhotic change. 4. Scattered small bilateral renal cysts seen. 5. Small nonobstructing 3 mm stone noted at the lower pole of the right kidney. 6. Diffuse calcification along the abdominal aorta and its branches. 7. Chronic compression deformity involving vertebral body L1, and mild chronic compression deformity involving L3.  ASSESSMENT AND PLAN:  1.  Lower ext edema. Has improved with lasix 80 BID now back to 40 BID.  Dr. Lovena Le has seen pt as well, we stopped Plendil as this may be playing a role changed to hydralazine 10 mg BID for now.   We will check urine to check protein if elevated will do 24 hr urine for protein.  Check Echo, discussed decrease salt including beer.  Keeping legs elevated.  2.  COPD he has inhaler that he uses.   3. 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 Followed by PCP  4. DM-2 followed by PCP  5. Tobacco use he is aware to decrease, he has stopped for up to 2 months in the past but is  hard for him to do but is aware of need to stop.    6. Cardiac no chest pain. No increase of chronic DOE per pt.  + coronary calcifications on old CT of chest.    Will see how echo looks.   7. HTN essential controlled.    Current medicines are reviewed with the patient today.  The patient Has no concerns regarding medicines.  The following changes have been made:  See above Labs/ tests ordered today include:see above  Disposition:   FU:  see above  Lennie Muckle, NP  01/12/2016 8:39 AM    [facility] [place], [address] [place], [place] Phone: [phone]; Fax: [phone]

## 2016-01-12 ENCOUNTER — Telehealth: Payer: Self-pay | Admitting: *Deleted

## 2016-01-12 ENCOUNTER — Ambulatory Visit
Admission: RE | Admit: 2016-01-12 | Discharge: 2016-01-12 | Disposition: A | Discharge status: Home / Self Care | Payer: Medicare HMO | Source: Ambulatory Visit | Attending: Cardiology | Admitting: Cardiology

## 2016-01-12 ENCOUNTER — Ambulatory Visit (INDEPENDENT_AMBULATORY_CARE_PROVIDER_SITE_OTHER): Payer: Medicare HMO | Admitting: Cardiology

## 2016-01-12 ENCOUNTER — Encounter: Payer: Self-pay | Admitting: Cardiology

## 2016-01-12 VITALS — BP 124/68 | HR 92 | Ht 70.0 in | Wt 249.8 lb

## 2016-01-12 DIAGNOSIS — R601 Generalized edema: Secondary | ICD-10-CM | POA: Diagnosis not present

## 2016-01-12 DIAGNOSIS — I729 Aneurysm of unspecified site: Secondary | ICD-10-CM | POA: Diagnosis not present

## 2016-01-12 DIAGNOSIS — R600 Localized edema: Secondary | ICD-10-CM | POA: Diagnosis not present

## 2016-01-12 DIAGNOSIS — J449 Chronic obstructive pulmonary disease, unspecified: Secondary | ICD-10-CM

## 2016-01-12 DIAGNOSIS — I10 Essential (primary) hypertension: Secondary | ICD-10-CM | POA: Diagnosis not present

## 2016-01-12 DIAGNOSIS — Z720 Tobacco use: Secondary | ICD-10-CM

## 2016-01-12 LAB — URINALYSIS
Bilirubin Urine: NEGATIVE
Hgb urine dipstick: NEGATIVE
Ketones, ur: NEGATIVE
LEUKOCYTES UA: NEGATIVE
Nitrite: NEGATIVE
PROTEIN: NEGATIVE
Specific Gravity, Urine: 1.015 (ref 1.001–1.035)
pH: 5 (ref 5.0–8.0)

## 2016-01-12 MED ORDER — HYDRALAZINE HCL 10 MG PO TABS: 10.0000 mg | ORAL_TABLET | Freq: Two times a day (BID) | ORAL | Status: DC

## 2016-01-12 NOTE — Telephone Encounter (Signed)
New message   Pt wife calling she states she is returning a call from the rn about the office visit today

## 2016-01-12 NOTE — Patient Instructions (Signed)
Medication Instructions:   STOP TAKING PLENDIL   START TAKING HYDRALAZINE 10 MG TWICE A DAY   If you need a refill on your cardiac medications before your next appointment, please call your pharmacy.  Labwork: U/A   Testing/Procedures:  Your physician has requested that you have an echocardiogram. NEXT WEEK  Echocardiography is a painless test that uses sound waves to create images of your heart. It provides your doctor with information about the size and shape of your heart and how well your heart's chambers and valves are working. This procedure takes approximately one hour. There are no restrictions for this procedure.  A chest x-ray takes a picture of the organs and structures inside the chest, including the heart, lungs, and blood vessels. This test can show several things, including, whether the heart is enlarges; whether fluid is building up in the lungs; and whether pacemaker / defibrillator leads are still in place.  [facility]AG[facility] AT [place]... IN THE [place] FIRST FLOOR     Follow-Up: IN3 WEEKS WITH LAUARA INGOLD OR ANOTHER EXTENDER   Any Other Special Instructions Will Be Listed Below (If Applicable).    KEEP LEGS ELEVATED AS MUCH AS POSSIBLE   Low-Sodium Eating Plan Sodium raises blood pressure and causes water to be held in the body. Getting less sodium from food will help lower your blood pressure, reduce any swelling, and protect your heart, liver, and kidneys. We get sodium by adding salt (sodium chloride) to food. Most of our sodium comes from canned, boxed, and frozen foods. Restaurant foods, fast foods, and pizza are also very high in sodium. Even if you take medicine to lower your blood pressure or to reduce fluid in your body, getting less sodium from your food is important. WHAT IS MY PLAN? Most people should limit their sodium intake to 2,300 mg a day. Your health care provider recommends that you limit your sodium intake to  __________ a day.  WHAT DO I NEED TO KNOW ABOUT THIS EATING PLAN? For the low-sodium eating plan, you will follow these general guidelines:  Choose foods with a % Daily Value for sodium of less than 5% (as listed on the food label).   Use salt-free seasonings or herbs instead of table salt or sea salt.   Check with your health care provider or pharmacist before using salt substitutes.   Eat fresh foods.  Eat more vegetables and fruits.  Limit canned vegetables. If you do use them, rinse them well to decrease the sodium.   Limit cheese to 1 oz (28 g) per day.   Eat lower-sodium products, often labeled as "lower sodium" or "no salt added."  Avoid foods that contain monosodium glutamate (MSG). MSG is sometimes added to [place] food and some canned foods.  Check food labels (Nutrition Facts labels) on foods to learn how much sodium is in one serving.  Eat more home-cooked food and less restaurant, buffet, and fast food.  When eating at a restaurant, ask that your food be prepared with less salt, or no salt if possible.  HOW DO I READ FOOD LABELS FOR SODIUM INFORMATION? The Nutrition Facts label lists the amount of sodium in one serving of the food. If you eat more than one serving, you must multiply the listed amount of sodium by the number of servings. Food labels may also identify foods as:  Sodium free--Less than 5 mg in a serving.  Very low sodium--35 mg or less in  a serving.  Low sodium--140 mg or less in a serving.  Light in sodium--50% less sodium in a serving. For example, if a food that usually has 300 mg of sodium is changed to become light in sodium, it will have 150 mg of sodium.  Reduced sodium--25% less sodium in a serving. For example, if a food that usually has 400 mg of sodium is changed to reduced sodium, it will have 300 mg of sodium. WHAT FOODS CAN I EAT? Grains Low-sodium cereals, including oats, puffed wheat and rice, and shredded wheat cereals.  Low-sodium crackers. Unsalted rice and pasta. Lower-sodium bread.  Vegetables Frozen or fresh vegetables. Low-sodium or reduced-sodium canned vegetables. Low-sodium or reduced-sodium tomato sauce and paste. Low-sodium or reduced-sodium tomato and vegetable juices.  Fruits Fresh, frozen, and canned fruit. Fruit juice.  Meat and Other Protein Products Low-sodium canned tuna and salmon. Fresh or frozen meat, poultry, seafood, and fish. Lamb. Unsalted nuts. Dried beans, peas, and lentils without added salt. Unsalted canned beans. Homemade soups without salt. Eggs.  Dairy Milk. Soy milk. Ricotta cheese. Low-sodium or reduced-sodium cheeses. Yogurt.  Condiments Fresh and dried herbs and spices. Salt-free seasonings. Onion and garlic powders. Low-sodium varieties of mustard and ketchup. Fresh or refrigerated horseradish. Lemon juice.  Fats and Oils Reduced-sodium salad dressings. Unsalted butter.  Other Unsalted popcorn and pretzels.  The items listed above may not be a complete list of recommended foods or beverages. Contact your dietitian for more options. WHAT FOODS ARE NOT RECOMMENDED? Grains Instant hot cereals. Bread stuffing, pancake, and biscuit mixes. Croutons. Seasoned rice or pasta mixes. Noodle soup cups. Boxed or frozen macaroni and cheese. Self-rising flour. Regular salted crackers. Vegetables Regular canned vegetables. Regular canned tomato sauce and paste. Regular tomato and vegetable juices. Frozen vegetables in sauces. Salted [place] fries. Olives. Angie Fava. Relishes. Sauerkraut. Salsa. Meat and Other Protein Products Salted, canned, smoked, spiced, or pickled meats, seafood, or fish. Bacon, ham, sausage, hot dogs, corned beef, chipped beef, and packaged luncheon meats. Salt pork. Jerky. Pickled herring. Anchovies, regular canned tuna, and sardines. Salted nuts. Dairy Processed cheese and cheese spreads. Cheese curds. Blue cheese and cottage cheese. Buttermilk.    Condiments Onion and garlic salt, seasoned salt, table salt, and sea salt. Canned and packaged gravies. Worcestershire sauce. Tartar sauce. Barbecue sauce. Teriyaki sauce. Soy sauce, including reduced sodium. Steak sauce. Fish sauce. Oyster sauce. Cocktail sauce. Horseradish that you find on the shelf. Regular ketchup and mustard. Meat flavorings and tenderizers. Bouillon cubes. Hot sauce. Tabasco sauce. Marinades. Taco seasonings. Relishes. Fats and Oils Regular salad dressings. Salted butter. Margarine. Ghee. Bacon fat.  Other Potato and tortilla chips. Corn chips and puffs. Salted popcorn and pretzels. Canned or dried soups. Pizza. Frozen entrees and pot pies.  The items listed above may not be a complete list of foods and beverages to avoid. Contact your dietitian for more information.   This information is not intended to replace advice given to you by your health care provider. Make sure you discuss any questions you have with your health care provider.   Document Released: 03/09/2002 Document Revised: 10/08/2014 Document Reviewed: 07/22/2013 Elsevier Interactive Patient Education [employer].

## 2016-01-12 NOTE — Telephone Encounter (Signed)
Barry Booker, pharmacist from [facility] called and stated that hydralazine is contraindicated in patients with angina. She would like a call back to let her know that Cecilie Kicks is aware of this before they will dispense the medication for the patient. She can be reached at [phone]. Thanks, MI

## 2016-01-13 ENCOUNTER — Telehealth: Payer: Self-pay | Admitting: Cardiology

## 2016-01-13 NOTE — Telephone Encounter (Signed)
New message      Returning a call to the nurse.  Please call after 12:00

## 2016-01-13 NOTE — Telephone Encounter (Signed)
Returned call again re: test and lab results. Left another message for pt to call back

## 2016-01-19 ENCOUNTER — Other Ambulatory Visit: Payer: Self-pay

## 2016-01-19 ENCOUNTER — Ambulatory Visit (HOSPITAL_COMMUNITY): Payer: Medicare HMO | Attending: Cardiology

## 2016-01-19 DIAGNOSIS — I119 Hypertensive heart disease without heart failure: Secondary | ICD-10-CM | POA: Diagnosis not present

## 2016-01-19 DIAGNOSIS — E785 Hyperlipidemia, unspecified: Secondary | ICD-10-CM | POA: Diagnosis not present

## 2016-01-19 DIAGNOSIS — Z720 Tobacco use: Secondary | ICD-10-CM | POA: Diagnosis not present

## 2016-01-19 DIAGNOSIS — E119 Type 2 diabetes mellitus without complications: Secondary | ICD-10-CM | POA: Insufficient documentation

## 2016-01-19 DIAGNOSIS — I059 Rheumatic mitral valve disease, unspecified: Secondary | ICD-10-CM | POA: Diagnosis not present

## 2016-01-19 DIAGNOSIS — I071 Rheumatic tricuspid insufficiency: Secondary | ICD-10-CM | POA: Diagnosis not present

## 2016-01-19 DIAGNOSIS — Z8249 Family history of ischemic heart disease and other diseases of the circulatory system: Secondary | ICD-10-CM | POA: Insufficient documentation

## 2016-01-19 DIAGNOSIS — R601 Generalized edema: Secondary | ICD-10-CM | POA: Insufficient documentation

## 2016-02-02 ENCOUNTER — Encounter: Payer: Self-pay | Admitting: Podiatry

## 2016-02-02 ENCOUNTER — Ambulatory Visit: Payer: Medicare HMO | Admitting: Physician Assistant

## 2016-02-02 ENCOUNTER — Ambulatory Visit (INDEPENDENT_AMBULATORY_CARE_PROVIDER_SITE_OTHER): Payer: Medicare HMO | Admitting: Podiatry

## 2016-02-02 DIAGNOSIS — B351 Tinea unguium: Secondary | ICD-10-CM | POA: Diagnosis not present

## 2016-02-02 DIAGNOSIS — M129 Arthropathy, unspecified: Secondary | ICD-10-CM

## 2016-02-02 DIAGNOSIS — M19079 Primary osteoarthritis, unspecified ankle and foot: Secondary | ICD-10-CM

## 2016-02-02 DIAGNOSIS — M79676 Pain in unspecified toe(s): Secondary | ICD-10-CM

## 2016-02-02 NOTE — Progress Notes (Signed)
Patient ID: Barry Booker, male   DOB: 1946/01/08, 70 y.o.   MRN: [id number] Complaint:  Visit Type: Patient returns to my office for continued preventative foot care services. Complaint: Patient states" my nails have grown long and thick and become painful to walk and wear shoes" . The patient presents for preventative foot care services. No changes to ROS  Podiatric Exam: Vascular: dorsalis pedis and posterior tibial pulses are palpable bilateral. Capillary return is immediate. Temperature gradient is WNL. Skin turgor WNL  Sensorium: Normal Semmes Weinstein monofilament test. Normal tactile sensation bilaterally. Nail Exam: Pt has thick disfigured discolored nails with subungual debris noted bilateral entire nail hallux through fifth toenails Ulcer Exam: There is no evidence of ulcer or pre-ulcerative changes or infection. Orthopedic Exam: Muscle tone and strength are WNL. No limitations in general ROM. No crepitus or effusions noted. Foot type and digits show no abnormalities. Bony prominences are unremarkable. Skin: No Porokeratosis. No infection or ulcers  Diagnosis:  Onychomycosis, , Pain in right toe, pain in left toes  Treatment & Plan Procedures and Treatment: Consent by patient was obtained for treatment procedures. The patient understood the discussion of treatment and procedures well. All questions were answered thoroughly reviewed. Debridement of mycotic and hypertrophic toenails, 1 through 5 bilateral and clearing of subungual debris. No ulceration, no infection noted.  Return Visit-Office Procedure: Patient instructed to return to the office for a follow up visit 3 months for continued evaluation and treatment.  Gardiner Barefoot DPM

## 2016-02-08 NOTE — Progress Notes (Signed)
Cardiology Office Note:    Date:  02/09/2016   ID:  Barry Booker, DOB Oct 11, 1945, MRN [id number]  PCP:  Phineas Inches, MD  Cardiologist:  Dr. Cristopher Peru   Electrophysiologist:  n/a  Referring MD: Bernerd Limbo, MD   Chief Complaint  Patient presents with  . Leg Swelling    Follow up    History of Present Illness:     Barry Booker is a 70 y.o. male with a hx of remote pulmonary embolism in 2003, HTN, HL, diabetes, COPD, alcohol abuse, small AAA. 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 [place] in 9/16 2.7 x 3.4 distal aneurysm. This is followed by PCP. Of note, cardiac calcifications were identified on CT.  Evaluated by Cecilie Kicks, NP and Dr. Cristopher Peru 01/12/16 for leg edema.  He had been placed on Lasix with some improvement. Plendil was stopped and he was changed to hydralazine. Echocardiogram demonstrated normal LV function and moderate diastolic dysfunction. Chest x-ray demonstrated pulmonary vascular prominence and possible edema versus atelectasis versus infiltrate. UA was negative for protein.  Returns for follow-up. Edema has improved. Denies chest pain. Has chronic dyspnea with exertion. He continues to smoke. Overall, he is NYHA 2b-3. He sleeps in a recliner for over a year now. Denies PND. Denies syncope. He has had some episodes of dizziness that sound consistent with hypoglycemia.   Past Medical History  Diagnosis Date  . Hypertension   . High cholesterol   . Alcohol abuse   . Diabetes mellitus without complication ([place])   . COPD (chronic obstructive pulmonary disease) ([place])   . Gout     Past Surgical History  Procedure Laterality Date  . Nose surgery  90's  . Colonoscopy      Current Medications: Outpatient Prescriptions Prior to Visit  Medication Sig Dispense Refill  . aspirin 81 MG tablet Take 81 mg by mouth daily.    [name] atorvastatin (LIPITOR) 40 MG tablet Take 40 mg by mouth daily.     . benazepril (LOTENSIN) 20 MG tablet Take 20 mg by mouth daily.     .  Calcium Carbonate (CALTRATE 600 PO) Take 1 tablet by mouth daily.     [name] FOLIC ACID PO Take 1 tablet by mouth daily.     . furosemide (LASIX) 20 MG tablet Take 20 mg by mouth 2 (two) times daily.     . hydrALAZINE (APRESOLINE) 10 MG tablet Take 1 tablet (10 mg total) by mouth 2 (two) times daily. 60 tablet 0  . metFORMIN (GLUCOPHAGE) 500 MG tablet Take 500 mg by mouth daily with breakfast.      No facility-administered medications prior to visit.      Allergies:   Neomycin-bacitracin zn-polymyx   Social History   Social History  . Marital Status: Divorced    Spouse Name: [name]  . Number of Children: [name]  . Years of Education: [name]   Social History Main Topics  . Smoking status: Current Every Day Smoker -- 0.25 packs/day for 50 years    Types: Cigarettes  . Smokeless tobacco: Never Used  . Alcohol Use: 21.0 oz/week    35 Cans of beer per week  . Drug Use: No  . Sexual Activity: Not Asked   Other Topics Concern  . None   Social History Narrative     Family History:  The patient's family history includes Arthritis in his mother; Cancer in his father and sister; Heart disease in his maternal uncle. There is no history  of Colon cancer.   ROS:   Please see the history of present illness.    Review of Systems  HENT: Positive for hearing loss.   Cardiovascular: Positive for dyspnea on exertion and leg swelling.  Respiratory: Positive for shortness of breath and wheezing.   Musculoskeletal: Positive for joint pain.  Neurological: Positive for loss of balance.   All other systems reviewed and are negative.   Physical Exam:    VS:  BP 138/78 mmHg  Pulse 82  Ht '5\' 10"'$  (1.778 m)  Wt 252 lb (114.306 kg)  BMI 36.16 kg/m2   GEN: Well nourished, well developed, in no acute distress HEENT: normal Neck: no JVD, no masses Cardiac: Normal S1/S2, RRR; no murmurs, rubs, or gallops, trace bilat LE edema;     Respiratory: Decreased breath sounds bilaterally with diffuse expiratory  wheezing, no rales GI: soft, nontender, nondistended MS: no deformity or atrophy Skin: warm and dry Neuro: No focal deficits  Psych: Alert and oriented x 3, normal affect  Wt Readings from Last 3 Encounters:  02/09/16 252 lb (114.306 kg)  01/12/16 249 lb 12.8 oz (113.309 kg)  11/30/15 244 lb (110.678 kg)      Studies/Labs Reviewed:     EKG:  EKG is  ordered today.  The ekg ordered today demonstrates NSR, HR 82, LAD, QTc 453 ms, no change from last tracing.  Recent Labs: No results found for requested labs within last 365 days.   Recent Lipid Panel    Component Value Date/Time   CHOL 223* 09/21/2008 0933   TRIG 62 09/21/2008 0933   HDL 46.0 09/21/2008 0933   CHOLHDL 4.8 CALC 09/21/2008 0933   VLDL 12 09/21/2008 0933   LDLDIRECT 167.8 09/21/2008 0933    Additional studies/ records that were reviewed today include:   Echo 01/19/16 EF 55-60%, normal wall motion, grade 2 diastolic dysfunction, MAC, trace TR   ASSESSMENT:     1. Chronic diastolic CHF (congestive heart failure) ([place])   2. Edema of both legs   3. Hypertensive heart disease with heart failure ([place])   4. History of pulmonary embolism   5. Chronic obstructive pulmonary disease, unspecified COPD type ([place])   6. HLD (hyperlipidemia)   7. TOBACCO ABUSE     PLAN:     In order of problems listed above:  1. Chronic diastolic CHF - Patient recently seen with evidence of volume excess and ?edema on CXR.  Echo with normal LVF and mod diastolic dysfunction.  Volume status currently looks optimal. Continue current therapy. Check follow-up BMET, BNP today. We discussed limiting salt.  Follow-up in 3 months.  2. Leg edema - Probably multifactorial. Edema improved significantly with discontinuation of calcium tablet blocker which probably played a large role.  3. HTN - Borderline control. Monitor.   4. Hx of Pulmonary Embolism - No recurrence.    5. COPD - FU with PCP.  Suspect this is major cause of his DOE.  No  mention of elevated PASP on echo.    6. HL - Managed by PCP.   7. Tobacco abuse - We discussed the importance of quitting.      Medication Adjustments/Labs and Tests Ordered: Current medicines are reviewed at length with the patient today.  Concerns regarding medicines are outlined above.  Medication changes, Labs and Tests ordered today are outlined in the Patient Instructions noted below. Patient Instructions  Medication Instructions:  Your physician recommends that you continue on your current medications as directed. Please refer  to the Current Medication list given to you today.  Labwork: Bmet, Bnp today Testing/Procedures: None ordered Follow-Up: Your physician recommends that you schedule a follow-up appointment in: 3 months with Margaret Pyle Any Other Special Instructions Will Be Listed Below (If Applicable). If you need a refill on your cardiac medications before your next appointment, please call your pharmacy.    Signed, Richardson Dopp, PA-C  02/09/2016 10:30 AM    [facility] [place], [place], [place]  [address] Phone: [phone]; Fax: [phone]

## 2016-02-09 ENCOUNTER — Ambulatory Visit (INDEPENDENT_AMBULATORY_CARE_PROVIDER_SITE_OTHER): Payer: Medicare HMO | Admitting: Physician Assistant

## 2016-02-09 ENCOUNTER — Encounter: Payer: Self-pay | Admitting: Physician Assistant

## 2016-02-09 VITALS — BP 138/78 | HR 82 | Ht 70.0 in | Wt 252.0 lb

## 2016-02-09 DIAGNOSIS — Z86711 Personal history of pulmonary embolism: Secondary | ICD-10-CM

## 2016-02-09 DIAGNOSIS — I110 Hypertensive heart disease with heart failure: Secondary | ICD-10-CM | POA: Diagnosis not present

## 2016-02-09 DIAGNOSIS — I5032 Chronic diastolic (congestive) heart failure: Secondary | ICD-10-CM

## 2016-02-09 DIAGNOSIS — F17200 Nicotine dependence, unspecified, uncomplicated: Secondary | ICD-10-CM

## 2016-02-09 DIAGNOSIS — R600 Localized edema: Secondary | ICD-10-CM

## 2016-02-09 DIAGNOSIS — E785 Hyperlipidemia, unspecified: Secondary | ICD-10-CM

## 2016-02-09 DIAGNOSIS — J449 Chronic obstructive pulmonary disease, unspecified: Secondary | ICD-10-CM

## 2016-02-09 LAB — BASIC METABOLIC PANEL
BUN: 10 mg/dL (ref 7–25)
CHLORIDE: 99 mmol/L (ref 98–110)
CO2: 30 mmol/L (ref 20–31)
CREATININE: 0.88 mg/dL (ref 0.70–1.25)
Calcium: 9.3 mg/dL (ref 8.6–10.3)
GLUCOSE: 124 mg/dL — AB (ref 65–99)
POTASSIUM: 4 mmol/L (ref 3.5–5.3)
Sodium: 137 mmol/L (ref 135–146)

## 2016-02-09 LAB — BRAIN NATRIURETIC PEPTIDE: Brain Natriuretic Peptide: 7.3 pg/mL (ref <100)

## 2016-02-09 NOTE — Patient Instructions (Addendum)
Medication Instructions:  Your physician recommends that you continue on your current medications as directed. Please refer to the Current Medication list given to you today.  Labwork: Bmet, Bnp today Testing/Procedures: None ordered Follow-Up: Your physician recommends that you schedule a follow-up appointment in: 3 months with Margaret Pyle Any Other Special Instructions Will Be Listed Below (If Applicable). If you need a refill on your cardiac medications before your next appointment, please call your pharmacy.

## 2016-05-02 ENCOUNTER — Ambulatory Visit (INDEPENDENT_AMBULATORY_CARE_PROVIDER_SITE_OTHER): Payer: Medicare HMO | Admitting: Podiatry

## 2016-05-02 ENCOUNTER — Encounter: Payer: Self-pay | Admitting: Podiatry

## 2016-05-02 DIAGNOSIS — M79676 Pain in unspecified toe(s): Secondary | ICD-10-CM

## 2016-05-02 DIAGNOSIS — B351 Tinea unguium: Secondary | ICD-10-CM

## 2016-05-02 NOTE — Progress Notes (Signed)
Patient ID: Barry Booker, male   DOB: 1946/01/16, 70 y.o.   MRN: [id number] Complaint:  Visit Type: Patient returns to my office for continued preventative foot care services. Complaint: Patient states" my nails have grown long and thick and become painful to walk and wear shoes" . The patient presents for preventative foot care services. No changes to ROS  Podiatric Exam: Vascular: dorsalis pedis and posterior tibial pulses are palpable bilateral. Capillary return is immediate. Temperature gradient is WNL. Skin turgor WNL  Sensorium: Normal Semmes Weinstein monofilament test. Normal tactile sensation bilaterally. Nail Exam: Pt has thick disfigured discolored nails with subungual debris noted bilateral entire nail hallux through fifth toenails Ulcer Exam: There is no evidence of ulcer or pre-ulcerative changes or infection. Orthopedic Exam: Muscle tone and strength are WNL. No limitations in general ROM. No crepitus or effusions noted. Foot type and digits show no abnormalities. Bony prominences are unremarkable. Skin: No Porokeratosis. No infection or ulcers  Diagnosis:  Onychomycosis, , Pain in right toe, pain in left toes  Treatment & Plan Procedures and Treatment: Consent by patient was obtained for treatment procedures. The patient understood the discussion of treatment and procedures well. All questions were answered thoroughly reviewed. Debridement of mycotic and hypertrophic toenails, 1 through 5 bilateral and clearing of subungual debris. No ulceration, no infection noted.  Return Visit-Office Procedure: Patient instructed to return to the office for a follow up visit 3 months for continued evaluation and treatment.  Gardiner Barefoot DPM

## 2016-05-10 NOTE — Progress Notes (Signed)
Cardiology Office Note:    Date:  05/11/2016   ID:  Barry Booker, DOB 1946/09/09, MRN [id number]  PCP:  Phineas Inches, MD  Cardiologist:  Dr. Cristopher Peru saw x 1 in 4/17 >> will FU with Richardson Dopp, PA-C for now.  Referring MD: Bernerd Limbo, MD   Chief Complaint  Patient presents with  . Follow-up    CHF   History of Present Illness:    Barry Booker is a 70 y.o. male with a hx of remote pulmonary embolism in 2003, HTN, HL, diabetes, COPD, alcohol abuse, small AAA. 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 [place] in 9/16 2.7 x 3.4 distal aneurysm. This is followed by PCP. Of note, coronary calcifications were identified on CT in the past.  Echocardiogram in 4/17 demonstrated normal LV function and moderate diastolic dysfunction.  Last seen by me 5/17. He returns for follow-up.  Here with his wife.  He notes a lot of indigestion.  He does drink "a lot" of beer. Some days, he may watch tv and drink beer all day long (10-12).  He also continues to smoke.  He notes continued DOE.  He also wheezes.  He denies chest pain or exertional chest pain. He denies syncope, orthopnea, PND, edema.  He denies bleeding issues.    Prior CV studies that were reviewed today include:    Echo 01/19/16 EF 55-60%, normal wall motion, grade 2 diastolic dysfunction, MAC, trace TR  Past Medical History:  Diagnosis Date  . Alcohol abuse   . COPD (chronic obstructive pulmonary disease) ([place])   . Diabetes mellitus without complication ([place])   . Gout   . High cholesterol   . Hypertension     Past Surgical History:  Procedure Laterality Date  . COLONOSCOPY    . NOSE SURGERY  90's    Current Medications: Current Meds  Medication Sig  . aspirin 81 MG tablet Take 81 mg by mouth daily.  [name] atorvastatin (LIPITOR) 40 MG tablet Take 40 mg by mouth daily.   . benazepril (LOTENSIN) 20 MG tablet Take 20 mg by mouth daily.   . Calcium Carbonate (CALTRATE 600 PO) Take 1 tablet by mouth daily.   [name] FOLIC ACID PO Take 1  tablet by mouth daily.   . furosemide (LASIX) 40 MG tablet Take 40 mg by mouth 2 (two) times daily.   . hydrALAZINE (APRESOLINE) 10 MG tablet TAKE 1 TABLET BY MOUTH TWICE DAILY  . metFORMIN (GLUCOPHAGE) 500 MG tablet Take 500 mg by mouth daily with breakfast.   . potassium chloride SA (K-DUR,KLOR-CON) 20 MEQ tablet Take 20 mEq by mouth daily.        Allergies:   Neomycin-bacitracin zn-polymyx   Social History   Social History  . Marital status: Divorced    Spouse name: [name]  . Number of children: [name]  . Years of education: [name]   Social History Main Topics  . Smoking status: Current Every Day Smoker    Packs/day: 0.25    Years: 50.00    Types: Cigarettes  . Smokeless tobacco: Never Used  . Alcohol use 21.0 oz/week    35 Cans of beer per week  . Drug use: No  . Sexual activity: Not Asked   Other Topics Concern  . None   Social History Narrative  . None     Family History:  The patient's family history includes Arthritis in his mother; Cancer in his father and sister; Heart disease in his maternal uncle.  ROS:   Please see the history of present illness.    ROS All other systems reviewed and are negative.   EKGs/Labs/Other Test Reviewed:    EKG:  EKG is  ordered today.  The ekg ordered today demonstrates NSR, HR 85, LAD, QTc 454 ms, no change since last tracing  Recent Labs: 02/09/2016: Brain Natriuretic Peptide 7.3; BUN 10; Creat 0.88; Potassium 4.0; Sodium 137   Recent Lipid Panel    Component Value Date/Time   CHOL 223 (HH) 09/21/2008 0933   TRIG 62 09/21/2008 0933   HDL 46.0 09/21/2008 0933   CHOLHDL 4.8 CALC 09/21/2008 0933   VLDL 12 09/21/2008 0933   LDLDIRECT 167.8 09/21/2008 0933    Physical Exam:    VS:  BP 130/70   Pulse 84   Ht '5\' 10"'$  (1.778 m)   Wt 247 lb (112 kg)   BMI 35.44 kg/m     Wt Readings from Last 3 Encounters:  05/11/16 247 lb (112 kg)  02/09/16 252 lb (114.3 kg)  01/12/16 249 lb 12.8 oz (113.3 kg)     Physical Exam    Constitutional: He is oriented to person, place, and time. He appears well-developed and well-nourished. No distress.  HENT:  Head: Normocephalic and atraumatic.  Eyes: No scleral icterus.  Neck: Normal range of motion. No JVD present.  Cardiovascular: Normal rate, regular rhythm, S1 normal and S2 normal.  Exam reveals no gallop and no friction rub.   No murmur heard. Pulmonary/Chest: Effort normal and breath sounds normal. He has no wheezes. He has no rhonchi. He has no rales.  Abdominal: Soft. There is no tenderness.  Musculoskeletal: He exhibits no edema.  Neurological: He is alert and oriented to person, place, and time.  Skin: Skin is warm and dry.  Psychiatric: He has a normal mood and affect.    ASSESSMENT:    1. Chronic diastolic CHF (congestive heart failure) ([place])   2. Hypertensive heart disease with heart failure ([place])   3. Chronic obstructive pulmonary disease, unspecified COPD type ([place])   4. TOBACCO ABUSE   5. Excessive drinking alcohol    PLAN:    In order of problems listed above:  1. Chronic diastolic CHF - Volume appears stable.  Continue current Rx.   2. HTN - BP is controlled.   3. COPD - FU with PCP.  This is the main contributor to his dyspnea.    4. Tobacco abuse - I again reinforced the importance of quitting.    5. Excessive ETOH use - I have asked him to reduce the amount that he drinks.    Medication Adjustments/Labs and Tests Ordered: Current medicines are reviewed at length with the patient today.  Concerns regarding medicines are outlined above.  Medication changes, Labs and Tests ordered today are outlined in the Patient Instructions noted below. Patient Instructions  Medication Instructions:  No changes today.  See your medication list. You can try Prilosec OTC (Omeprazole) 20 mg Once daily for 2 weeks to see if it helps your indigestion.  You can buy this over the counter.  Follow up with BOUSKA,DAVID E, MD for your indigestion if it  continues.   Labwork: None today.  Testing/Procedures: None   Follow-Up: Richardson Dopp, PA-C in 6 months.   Any Other Special Instructions Will Be Listed Below (If Applicable). Keep working on quitting smoking.  If you need a refill on your cardiac medications before your next appointment, please call your pharmacy.   Signed, Richardson Dopp,  PA-C  05/11/2016 9:50 AM    [facility] [address], [place], [place]  [address] Phone: [phone]; Fax: [phone]

## 2016-05-11 ENCOUNTER — Ambulatory Visit (INDEPENDENT_AMBULATORY_CARE_PROVIDER_SITE_OTHER): Payer: Medicare HMO | Admitting: Physician Assistant

## 2016-05-11 ENCOUNTER — Encounter: Payer: Self-pay | Admitting: Physician Assistant

## 2016-05-11 VITALS — BP 130/70 | HR 84 | Ht 70.0 in | Wt 247.0 lb

## 2016-05-11 DIAGNOSIS — I5032 Chronic diastolic (congestive) heart failure: Secondary | ICD-10-CM

## 2016-05-11 DIAGNOSIS — F17200 Nicotine dependence, unspecified, uncomplicated: Secondary | ICD-10-CM | POA: Diagnosis not present

## 2016-05-11 DIAGNOSIS — I110 Hypertensive heart disease with heart failure: Secondary | ICD-10-CM

## 2016-05-11 DIAGNOSIS — F1010 Alcohol abuse, uncomplicated: Secondary | ICD-10-CM

## 2016-05-11 DIAGNOSIS — J449 Chronic obstructive pulmonary disease, unspecified: Secondary | ICD-10-CM | POA: Diagnosis not present

## 2016-05-11 NOTE — Patient Instructions (Addendum)
Medication Instructions:  No changes today.  See your medication list. You can try Prilosec OTC (Omeprazole) 20 mg Once daily for 2 weeks to see if it helps your indigestion.  You can buy this over the counter.  Follow up with BOUSKA,DAVID E, MD for your indigestion if it continues.   Labwork: None today.  Testing/Procedures: None   Follow-Up: Richardson Dopp, PA-C in 6 months.   Any Other Special Instructions Will Be Listed Below (If Applicable). Keep working on quitting smoking.  If you need a refill on your cardiac medications before your next appointment, please call your pharmacy.

## 2016-07-25 ENCOUNTER — Ambulatory Visit (INDEPENDENT_AMBULATORY_CARE_PROVIDER_SITE_OTHER): Payer: Medicare HMO | Admitting: Podiatry

## 2016-07-25 ENCOUNTER — Encounter: Payer: Self-pay | Admitting: Podiatry

## 2016-07-25 VITALS — Ht 70.0 in | Wt 247.0 lb

## 2016-07-25 DIAGNOSIS — B351 Tinea unguium: Secondary | ICD-10-CM

## 2016-07-25 DIAGNOSIS — M19079 Primary osteoarthritis, unspecified ankle and foot: Secondary | ICD-10-CM

## 2016-07-25 DIAGNOSIS — M79676 Pain in unspecified toe(s): Secondary | ICD-10-CM | POA: Diagnosis not present

## 2016-07-25 NOTE — Progress Notes (Signed)
Patient ID: Barry Booker, male   DOB: 07/20/1946, 70 y.o.   MRN: [id number] Complaint:  Visit Type: Patient returns to my office for continued preventative foot care services. Complaint: Patient states" my nails have grown long and thick and become painful to walk and wear shoes" . The patient presents for preventative foot care services. No changes to ROS  Podiatric Exam: Vascular: dorsalis pedis and posterior tibial pulses are palpable bilateral. Capillary return is immediate. Temperature gradient is WNL. Skin turgor WNL  Sensorium: Normal Semmes Weinstein monofilament test. Normal tactile sensation bilaterally. Nail Exam: Pt has thick disfigured discolored nails with subungual debris noted bilateral entire nail hallux through fifth toenails Ulcer Exam: There is no evidence of ulcer or pre-ulcerative changes or infection. Orthopedic Exam: Muscle tone and strength are WNL. No limitations in general ROM. No crepitus or effusions noted. Foot type and digits show no abnormalities. Bony prominences are unremarkable. Skin: No Porokeratosis. No infection or ulcers  Diagnosis:  Onychomycosis, , Pain in right toe, pain in left toes  Treatment & Plan Procedures and Treatment: Consent by patient was obtained for treatment procedures. The patient understood the discussion of treatment and procedures well. All questions were answered thoroughly reviewed. Debridement of mycotic and hypertrophic toenails, 1 through 5 bilateral and clearing of subungual debris. No ulceration, no infection noted.  Return Visit-Office Procedure: Patient instructed to return to the office for a follow up visit 3 months for continued evaluation and treatment.  Heriberto Stmartin DPM 

## 2016-10-17 ENCOUNTER — Ambulatory Visit: Payer: Medicare HMO | Admitting: Podiatry

## 2016-10-24 ENCOUNTER — Encounter: Payer: Self-pay | Admitting: Podiatry

## 2016-10-24 ENCOUNTER — Ambulatory Visit (INDEPENDENT_AMBULATORY_CARE_PROVIDER_SITE_OTHER): Payer: Self-pay | Admitting: Podiatry

## 2016-10-24 VITALS — Ht 70.0 in | Wt 247.0 lb

## 2016-10-24 DIAGNOSIS — B351 Tinea unguium: Secondary | ICD-10-CM

## 2016-10-24 DIAGNOSIS — M79676 Pain in unspecified toe(s): Secondary | ICD-10-CM

## 2016-10-24 NOTE — Progress Notes (Signed)
Patient ID: Barry Booker, male   DOB: 05/11/1946, 70 y.o.   MRN: [id number] Complaint:  Visit Type: Patient returns to my office for continued preventative foot care services. Complaint: Patient states" my nails have grown long and thick and become painful to walk and wear shoes" . The patient presents for preventative foot care services. No changes to ROS  Podiatric Exam: Vascular: dorsalis pedis and posterior tibial pulses are palpable bilateral. Capillary return is immediate. Temperature gradient is WNL. Skin turgor WNL  Sensorium: Normal Semmes Weinstein monofilament test. Normal tactile sensation bilaterally. Nail Exam: Pt has thick disfigured discolored nails with subungual debris noted bilateral entire nail hallux through fifth toenails Ulcer Exam: There is no evidence of ulcer or pre-ulcerative changes or infection. Orthopedic Exam: Muscle tone and strength are WNL. No limitations in general ROM. No crepitus or effusions noted. Foot type and digits show no abnormalities. Bony prominences are unremarkable. Skin: No Porokeratosis. No infection or ulcers  Diagnosis:  Onychomycosis, , Pain in right toe, pain in left toes  Treatment & Plan Procedures and Treatment: Consent by patient was obtained for treatment procedures. The patient understood the discussion of treatment and procedures well. All questions were answered thoroughly reviewed. Debridement of mycotic and hypertrophic toenails, 1 through 5 bilateral and clearing of subungual debris. No ulceration, no infection noted.  Return Visit-Office Procedure: Patient instructed to return to the office for a follow up visit 3 months for continued evaluation and treatment.  Rebekkah Powless DPM 

## 2016-11-06 NOTE — Progress Notes (Signed)
Cardiology Office Note:    Date:  11/07/2016   ID:  Barry Booker, DOB Feb 14, 1946, MRN [id number]  PCP:  Phineas Inches, MD  Cardiologist:  Dr. Cristopher Peru saw x 1 in 4/17 >> will FU with Richardson Dopp, PA-C for now.  Electrophysiologist:  n/a  Referring MD: Bernerd Limbo, MD   Chief Complaint  Patient presents with  . Follow-up    CHF    History of Present Illness:    Barry Booker is a 71 y.o. male with a hx of remote pulmonary embolism in 2003, HTN, HL, diabetes, COPD, alcohol abuse, small AAA. 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 [place] in 9/16 2.7 x 3.4 distal aneurysm. This is followed by PCP. Of note, coronary calcifications were identified on CT in the past.  Echocardiogram in 4/17 demonstrated normal LV function and moderate diastolic dysfunction.  Last seen in 8/17.    He returns for Cardiology follow up.  He is here with his wife.  He is doing ok.  He remains short of breath with activity.  He still smokes.  He has been cutting back.  He denies chest pain, syncope, PND, edema.  He denies any bleeding issues.    Prior CV studies:   The following studies were reviewed today:  Echo 01/19/16 EF 55-60%, normal wall motion, grade 2 diastolic dysfunction, MAC, trace TR  Past Medical History:  Diagnosis Date  . Alcohol abuse   . COPD (chronic obstructive pulmonary disease) ([place])   . Diabetes mellitus without complication ([place])   . Gout   . High cholesterol   . Hypertension     Past Surgical History:  Procedure Laterality Date  . COLONOSCOPY    . NOSE SURGERY  90's    Current Medications: Current Meds  Medication Sig  . aspirin 81 MG tablet Take 81 mg by mouth daily.  [name] atorvastatin (LIPITOR) 40 MG tablet Take 40 mg by mouth daily.   . benazepril (LOTENSIN) 20 MG tablet Take 20 mg by mouth daily.   . Calcium Carbonate (CALTRATE 600 PO) Take 1 tablet by mouth daily.   [name] FOLIC ACID PO Take 1 tablet by mouth daily.   . furosemide (LASIX) 40 MG tablet Take 40 mg by mouth 2  (two) times daily.   . hydrALAZINE (APRESOLINE) 10 MG tablet TAKE 1 TABLET BY MOUTH TWICE DAILY  . metFORMIN (GLUCOPHAGE) 500 MG tablet Take 500 mg by mouth 2 (two) times daily with a meal.   . potassium chloride SA (K-DUR,KLOR-CON) 20 MEQ tablet Take 20 mEq by mouth daily.      Allergies:   Neomycin-bacitracin zn-polymyx   Social History   Social History  . Marital status: Divorced    Spouse name: [name]  . Number of children: [name]  . Years of education: [name]   Social History Main Topics  . Smoking status: Current Every Day Smoker    Packs/day: 0.25    Years: 50.00    Types: Cigarettes  . Smokeless tobacco: Never Used  . Alcohol use 21.0 oz/week    35 Cans of beer per week  . Drug use: No  . Sexual activity: Not Asked   Other Topics Concern  . None   Social History Narrative  . None     Family History  Problem Relation Age of Onset  . Arthritis Mother   . Cancer Father   . Cancer Sister   . Heart disease Maternal Uncle   . Colon cancer Neg Hx  ROS:   Please see the history of present illness.    ROS All other systems reviewed and are negative.   EKGs/Labs/Other Test Reviewed:    EKG:  EKG is  ordered today.  The ekg ordered today demonstrates NSR, HR 79, LAD, QTc 415 ms, no changes  Recent Labs: 02/09/2016: Brain Natriuretic Peptide 7.3; BUN 10; Creat 0.88; Potassium 4.0; Sodium 137   Recent Lipid Panel    Component Value Date/Time   CHOL 223 (HH) 09/21/2008 0933   TRIG 62 09/21/2008 0933   HDL 46.0 09/21/2008 0933   CHOLHDL 4.8 CALC 09/21/2008 0933   VLDL 12 09/21/2008 0933   LDLDIRECT 167.8 09/21/2008 0933     Physical Exam:    VS:  BP (!) 118/58 (BP Location: Right Arm, Patient Position: Sitting, Cuff Size: Normal)   Pulse 79   Ht 5' 10"  (1.778 m)   Wt 241 lb 12.8 oz (109.7 kg)   BMI 34.69 kg/m     Wt Readings from Last 3 Encounters:  11/07/16 241 lb 12.8 oz (109.7 kg)  10/24/16 247 lb (112 kg)  07/25/16 247 lb (112 kg)     Physical  Exam  Constitutional: He is oriented to person, place, and time. He appears well-developed and well-nourished. No distress.  HENT:  Head: Normocephalic and atraumatic.  Eyes: No scleral icterus.  Neck:  I cannot appreciate JVD  Cardiovascular: Normal rate, regular rhythm and normal heart sounds.   No murmur heard. Pulmonary/Chest: He has decreased breath sounds. He has no wheezes. He has no rhonchi. He has no rales.  Abdominal: Soft. There is no tenderness.  Musculoskeletal: He exhibits no edema.  Neurological: He is alert and oriented to person, place, and time.  Skin: Skin is warm and dry.  Psychiatric: He has a normal mood and affect.    ASSESSMENT:    1. Chronic diastolic CHF (congestive heart failure) ([place])   2. Hypertensive heart disease with heart failure ([place])   3. Chronic obstructive pulmonary disease, unspecified COPD type ([place])   4. TOBACCO ABUSE   5. Abdominal aortic aneurysm (AAA) without rupture ([facility])    PLAN:    In order of problems listed above:  1. Chronic diastolic CHF - Volume is stable.  Recent Creatinine with his PCP was normal.  Continue current  Rx.  2. HTN - BP is controlled.   3. COPD - His dyspnea is multifactorial but COPD is the major contributor.  4. Tobacco abuse - We again discussed the importance of quitting.  5. AAA - Followed by primary care.  [place] in 9/16 demonstrated 3.4 cm aneurysm.   Medication Adjustments/Labs and Tests Ordered: Current medicines are reviewed at length with the patient today.  Concerns regarding medicines are outlined above.  Medication changes, Labs and Tests ordered today are outlined in the Patient Instructions noted below. Patient Instructions  Medication Instructions:  Your physician recommends that you continue on your current medications as directed. Please refer to the Current Medication list given to you today.  Labwork: NONE  Testing/Procedures: NONE  Follow-Up: Your physician wants you to  follow-up in: [place],  [facility]  You will receive a reminder letter in the mail two months in advance. If you don't receive a letter, please call our office to schedule the follow-up appointment.  Any Other Special Instructions Will Be Listed Below (If Applicable).  If you need a refill on your cardiac medications before your next appointment, please call your pharmacy.  Return in about 1 year (around 11/07/2017) for Routine Follow Up, w/ Richardson Dopp, PA-C.   Signed, Richardson Dopp, PA-C  11/07/2016 10:28 AM    [facility] [address], [place], [place]  [address] Phone: [phone]; Fax: [phone]

## 2016-11-07 ENCOUNTER — Encounter: Payer: Self-pay | Admitting: Physician Assistant

## 2016-11-07 ENCOUNTER — Ambulatory Visit (INDEPENDENT_AMBULATORY_CARE_PROVIDER_SITE_OTHER): Payer: Medicare HMO | Admitting: Physician Assistant

## 2016-11-07 VITALS — BP 118/58 | HR 79 | Ht 70.0 in | Wt 241.8 lb

## 2016-11-07 DIAGNOSIS — I5032 Chronic diastolic (congestive) heart failure: Secondary | ICD-10-CM

## 2016-11-07 DIAGNOSIS — F17200 Nicotine dependence, unspecified, uncomplicated: Secondary | ICD-10-CM | POA: Diagnosis not present

## 2016-11-07 DIAGNOSIS — I110 Hypertensive heart disease with heart failure: Secondary | ICD-10-CM | POA: Diagnosis not present

## 2016-11-07 DIAGNOSIS — I714 Abdominal aortic aneurysm, without rupture, unspecified: Secondary | ICD-10-CM

## 2016-11-07 DIAGNOSIS — J449 Chronic obstructive pulmonary disease, unspecified: Secondary | ICD-10-CM

## 2016-11-07 NOTE — Patient Instructions (Addendum)
Medication Instructions:  Your physician recommends that you continue on your current medications as directed. Please refer to the Current Medication list given to you today.  Labwork: NONE  Testing/Procedures: NONE  Follow-Up: Your physician wants you to follow-up in: [place],  [facility]  You will receive a reminder letter in the mail two months in advance. If you don't receive a letter, please call our office to schedule the follow-up appointment.  Any Other Special Instructions Will Be Listed Below (If Applicable).  If you need a refill on your cardiac medications before your next appointment, please call your pharmacy.

## 2017-01-16 ENCOUNTER — Ambulatory Visit: Payer: Medicare HMO | Admitting: Podiatry

## 2017-01-22 ENCOUNTER — Ambulatory Visit: Payer: Medicare HMO | Admitting: Podiatry

## 2017-02-20 ENCOUNTER — Ambulatory Visit (INDEPENDENT_AMBULATORY_CARE_PROVIDER_SITE_OTHER): Payer: Medicare HMO | Admitting: Podiatry

## 2017-02-20 ENCOUNTER — Encounter: Payer: Self-pay | Admitting: Podiatry

## 2017-02-20 DIAGNOSIS — M79676 Pain in unspecified toe(s): Secondary | ICD-10-CM

## 2017-02-20 DIAGNOSIS — B351 Tinea unguium: Secondary | ICD-10-CM

## 2017-02-20 NOTE — Progress Notes (Signed)
Patient ID: Barry Booker, male   DOB: June 09, 1946, 71 y.o.   MRN: [id number] Complaint:  Visit Type: Patient returns to my office for continued preventative foot care services. Complaint: Patient states" my nails have grown long and thick and become painful to walk and wear shoes" . The patient presents for preventative foot care services. No changes to ROS  Podiatric Exam: Vascular: dorsalis pedis and posterior tibial pulses are palpable bilateral. Capillary return is immediate. Temperature gradient is WNL. Skin turgor WNL  Sensorium: Normal Semmes Weinstein monofilament test. Normal tactile sensation bilaterally. Nail Exam: Pt has thick disfigured discolored nails with subungual debris noted bilateral entire nail hallux through fifth toenails Ulcer Exam: There is no evidence of ulcer or pre-ulcerative changes or infection. Orthopedic Exam: Muscle tone and strength are WNL. No limitations in general ROM. No crepitus or effusions noted. Foot type and digits show no abnormalities. Bony prominences are unremarkable. Skin: No Porokeratosis. No infection or ulcers  Diagnosis:  Onychomycosis, , Pain in right toe, pain in left toes  Treatment & Plan Procedures and Treatment: Consent by patient was obtained for treatment procedures. The patient understood the discussion of treatment and procedures well. All questions were answered thoroughly reviewed. Debridement of mycotic and hypertrophic toenails, 1 through 5 bilateral and clearing of subungual debris. No ulceration, no infection noted.  Return Visit-Office Procedure: Patient instructed to return to the office for a follow up visit 3 months for continued evaluation and treatment.  Gardiner Barefoot DPM

## 2017-05-24 ENCOUNTER — Ambulatory Visit: Payer: Medicare HMO | Admitting: Podiatry

## 2017-06-07 ENCOUNTER — Ambulatory Visit (INDEPENDENT_AMBULATORY_CARE_PROVIDER_SITE_OTHER): Payer: Medicare HMO | Admitting: Podiatry

## 2017-06-07 ENCOUNTER — Encounter: Payer: Self-pay | Admitting: Podiatry

## 2017-06-07 DIAGNOSIS — M79676 Pain in unspecified toe(s): Secondary | ICD-10-CM

## 2017-06-07 DIAGNOSIS — B351 Tinea unguium: Secondary | ICD-10-CM

## 2017-06-07 DIAGNOSIS — M19079 Primary osteoarthritis, unspecified ankle and foot: Secondary | ICD-10-CM

## 2017-06-07 NOTE — Progress Notes (Signed)
Patient ID: Barry Booker, male   DOB: 05/30/1946, 71 y.o.   MRN: [id number] Complaint:  Visit Type: Patient returns to my office for continued preventative foot care services. Complaint: Patient states" my nails have grown long and thick and become painful to walk and wear shoes" . The patient presents for preventative foot care services. No changes to ROS  Podiatric Exam: Vascular: dorsalis pedis and posterior tibial pulses are palpable bilateral. Capillary return is immediate. Temperature gradient is WNL. Skin turgor WNL  Sensorium: Normal Semmes Weinstein monofilament test. Normal tactile sensation bilaterally. Nail Exam: Pt has thick disfigured discolored nails with subungual debris noted bilateral entire nail hallux through fifth toenails Ulcer Exam: There is no evidence of ulcer or pre-ulcerative changes or infection. Orthopedic Exam: Muscle tone and strength are WNL. No limitations in general ROM. No crepitus or effusions noted. Foot type and digits show no abnormalities. Bony prominences are unremarkable. Skin: No Porokeratosis. No infection or ulcers  Diagnosis:  Onychomycosis, , Pain in right toe, pain in left toes  Treatment & Plan Procedures and Treatment: Consent by patient was obtained for treatment procedures. The patient understood the discussion of treatment and procedures well. All questions were answered thoroughly reviewed. Debridement of mycotic and hypertrophic toenails, 1 through 5 bilateral and clearing of subungual debris. No ulceration, no infection noted.  Return Visit-Office Procedure: Patient instructed to return to the office for a follow up visit 3 months for continued evaluation and treatment.  Gardiner Barefoot DPM

## 2017-09-13 ENCOUNTER — Ambulatory Visit: Payer: Medicare HMO | Admitting: Podiatry

## 2017-10-15 ENCOUNTER — Encounter: Payer: Self-pay | Admitting: Podiatry

## 2017-10-15 ENCOUNTER — Ambulatory Visit: Payer: Medicare HMO | Admitting: Podiatry

## 2017-10-15 DIAGNOSIS — M79676 Pain in unspecified toe(s): Secondary | ICD-10-CM

## 2017-10-15 DIAGNOSIS — B351 Tinea unguium: Secondary | ICD-10-CM | POA: Diagnosis not present

## 2017-10-15 DIAGNOSIS — E119 Type 2 diabetes mellitus without complications: Secondary | ICD-10-CM

## 2017-10-15 NOTE — Progress Notes (Signed)
Patient ID: FLEET HIGHAM, male   DOB: [birth year], 72 y.o.   MRN: [id number] Complaint:  Visit Type: Patient returns to my office for continued preventative foot care services. Complaint: Patient states" my nails have grown long and thick and become painful to walk and wear shoes" . The patient presents for preventative foot care services. No changes to ROS  Podiatric Exam: Vascular: dorsalis pedis and posterior tibial pulses are palpable bilateral. Capillary return is immediate. Temperature gradient is WNL. Skin turgor WNL  Sensorium: Normal Semmes Weinstein monofilament test. Normal tactile sensation bilaterally. Nail Exam: Pt has thick disfigured discolored nails with subungual debris noted bilateral entire nail hallux through fifth toenails Ulcer Exam: There is no evidence of ulcer or pre-ulcerative changes or infection. Orthopedic Exam: Muscle tone and strength are WNL. No limitations in general ROM. No crepitus or effusions noted. Foot type and digits show no abnormalities. Bony prominences are unremarkable. Skin: No Porokeratosis. No infection or ulcers  Diagnosis:  Onychomycosis, , Pain in right toe, pain in left toes  Treatment & Plan Procedures and Treatment: Consent by patient was obtained for treatment procedures. The patient understood the discussion of treatment and procedures well. All questions were answered thoroughly reviewed. Debridement of mycotic and hypertrophic toenails, 1 through 5 bilateral and clearing of subungual debris. No ulceration, no infection noted.  Return Visit-Office Procedure: Patient instructed to return to the office for a follow up visit 3 months for continued evaluation and treatment.  Gardiner Barefoot DPM

## 2018-01-15 ENCOUNTER — Encounter: Payer: Self-pay | Admitting: Podiatry

## 2018-01-15 ENCOUNTER — Ambulatory Visit: Payer: Medicare HMO | Admitting: Podiatry

## 2018-01-15 DIAGNOSIS — E119 Type 2 diabetes mellitus without complications: Secondary | ICD-10-CM | POA: Diagnosis not present

## 2018-01-15 DIAGNOSIS — M79676 Pain in unspecified toe(s): Secondary | ICD-10-CM

## 2018-01-15 DIAGNOSIS — B351 Tinea unguium: Secondary | ICD-10-CM

## 2018-01-15 NOTE — Progress Notes (Signed)
Patient ID: Barry Booker, male   DOB: [birth year], 72 y.o.   MRN: [id number] Complaint:  Visit Type: Patient returns to my office for continued preventative foot care services. Complaint: Patient states" my nails have grown long and thick and become painful to walk and wear shoes" . The patient presents for preventative foot care services. No changes to ROS  Podiatric Exam: Vascular: dorsalis pedis and posterior tibial pulses are palpable bilateral. Capillary return is immediate. Temperature gradient is WNL. Skin turgor WNL  Sensorium: Normal Semmes Weinstein monofilament test. Normal tactile sensation bilaterally. Nail Exam: Pt has thick disfigured discolored nails with subungual debris noted bilateral entire nail hallux through fifth toenails Ulcer Exam: There is no evidence of ulcer or pre-ulcerative changes or infection. Orthopedic Exam: Muscle tone and strength are WNL. No limitations in general ROM. No crepitus or effusions noted. Foot type and digits show no abnormalities. Bony prominences are unremarkable. Skin: No Porokeratosis. No infection or ulcers  Diagnosis:  Onychomycosis, , Pain in right toe, pain in left toes  Treatment & Plan Procedures and Treatment: Consent by patient was obtained for treatment procedures. The patient understood the discussion of treatment and procedures well. All questions were answered thoroughly reviewed. Debridement of mycotic and hypertrophic toenails, 1 through 5 bilateral and clearing of subungual debris. No ulceration, no infection noted.  Return Visit-Office Procedure: Patient instructed to return to the office for a follow up visit 3 months for continued evaluation and treatment.  Gardiner Barefoot DPM

## 2018-03-04 ENCOUNTER — Ambulatory Visit: Payer: Medicare HMO | Admitting: Pulmonary Disease

## 2018-03-04 ENCOUNTER — Encounter: Payer: Self-pay | Admitting: Pulmonary Disease

## 2018-03-04 ENCOUNTER — Other Ambulatory Visit (INDEPENDENT_AMBULATORY_CARE_PROVIDER_SITE_OTHER): Payer: Medicare HMO

## 2018-03-04 VITALS — BP 122/76 | HR 100 | Ht 70.0 in | Wt 201.6 lb

## 2018-03-04 DIAGNOSIS — R911 Solitary pulmonary nodule: Secondary | ICD-10-CM

## 2018-03-04 DIAGNOSIS — R918 Other nonspecific abnormal finding of lung field: Secondary | ICD-10-CM | POA: Diagnosis not present

## 2018-03-04 LAB — PROTIME-INR
INR: 1.2 ratio — ABNORMAL HIGH (ref 0.8–1.0)
PROTHROMBIN TIME: 13.8 s — AB (ref 9.6–13.1)

## 2018-03-04 MED ORDER — ALPRAZOLAM 0.25 MG PO TABS: 0.2500 mg | ORAL_TABLET | Freq: Every evening | ORAL | 0 refills | Status: DC | PRN

## 2018-03-04 NOTE — Patient Instructions (Signed)
We will get a PET scan, PFTs Check PT/INR today Based on these results we will schedule you for a bronchoscopy with biopsy. Follow-up in 1 month.

## 2018-03-04 NOTE — Addendum Note (Signed)
Addended by: Maryanna Shape A on: 03/04/2018 03:46 PM   Modules accepted: Orders

## 2018-03-04 NOTE — Progress Notes (Signed)
Barry Booker    [id number]    [birth year]  Primary Care Physician:Bouska, Shanon Brow, MD  Referring Physician: Bernerd Limbo, MD [place], [place] [address]  Chief complaint: Consult for lung mass  HPI: 73 year old with COPD, hypertension, hyperlipidemia, diastolic heart failure He had a chest x-ray which showed right upper lobe collapse primary care.  A follow-up CT showed mediastinal mass with right upper lobe bronchial narrowing and collapse and has been referred to pulmonary for further evaluation Chief complaint is cough, mucus production, dyspnea with activity and at rest.  He reports ongoing weakness, loss of weight and appetite for the past few months Denies any hemoptysis, fevers, chills.  Pets:No pets Einar Gip Occupation: Retired Games developer Exposures: No known exposures, no mold, hot tub Smoking history: 50-pack-year smoking history.  Quit in October 2018 Travel history: No significant travel Relevant family history: Father died of lung cancer.  Outpatient Encounter Medications as of 03/04/2018  Medication Sig  . aspirin 81 MG tablet Take 81 mg by mouth daily.  [name] atorvastatin (LIPITOR) 40 MG tablet Take 40 mg by mouth daily.   . benazepril (LOTENSIN) 20 MG tablet Take 20 mg by mouth daily.   . Calcium Carbonate (CALTRATE 600 PO) Take 1 tablet by mouth daily.   [name] FOLIC ACID PO Take 1 tablet by mouth daily.   . furosemide (LASIX) 40 MG tablet Take 40 mg by mouth 2 (two) times daily.   . hydrALAZINE (APRESOLINE) 10 MG tablet TAKE 1 TABLET BY MOUTH TWICE DAILY  . metFORMIN (GLUCOPHAGE) 500 MG tablet Take 500 mg by mouth 2 (two) times daily with a meal.   . potassium chloride SA (K-DUR,KLOR-CON) 20 MEQ tablet Take 20 mEq by mouth daily.   [name] umeclidinium-vilanterol (ANORO ELLIPTA) 62.5-25 MCG/INH AEPB Inhale into the lungs.   No facility-administered encounter medications on file as of 03/04/2018.     Allergies as of 03/04/2018 - Review Complete  03/04/2018  Allergen Reaction Noted  . Neomycin-bacitracin zn-polymyx Rash 11/05/2006    Past Medical History:  Diagnosis Date  . Alcohol abuse   . COPD (chronic obstructive pulmonary disease) ([place])   . Diabetes mellitus without complication ([place])   . Gout   . High cholesterol   . Hypertension     Past Surgical History:  Procedure Laterality Date  . COLONOSCOPY    . NOSE SURGERY  90's    Family History  Problem Relation Age of Onset  . Arthritis Mother   . Cancer Father   . Cancer Sister   . Heart disease Maternal Uncle   . Colon cancer Neg Hx     Social History   Socioeconomic History  . Marital status: Divorced    Spouse name: Not on file  . Number of children: Not on file  . Years of education: Not on file  . Highest education level: Not on file  Occupational History  . Not on file  Social Needs  . Financial resource strain: Not on file  . Food insecurity:    Worry: Not on file    Inability: Not on file  . Transportation needs:    Medical: Not on file    Non-medical: Not on file  Tobacco Use  . Smoking status: Former Smoker    Packs/day: 0.25    Years: 50.00    Pack years: 12.50    Types: Cigarettes    Last attempt to quit: 07/2017    Years  since quitting: 0.6  . Smokeless tobacco: Never Used  . Tobacco comment: quit smoking 07/2017  Substance and Sexual Activity  . Alcohol use: Yes    Comment: occ  . Drug use: No  . Sexual activity: Not on file  Lifestyle  . Physical activity:    Days per week: Not on file    Minutes per session: Not on file  . Stress: Not on file  Relationships  . Social connections:    Talks on phone: Not on file    Gets together: Not on file    Attends religious service: Not on file    Active member of club or organization: Not on file    Attends meetings of clubs or organizations: Not on file    Relationship status: Not on file  . Intimate partner violence:    Fear of current or ex partner: Not on file     Emotionally abused: Not on file    Physically abused: Not on file    Forced sexual activity: Not on file  Other Topics Concern  . Not on file  Social History Narrative  . Not on file    Review of systems: Review of Systems  Constitutional: Negative for fever and chills.  HENT: Negative.   Eyes: Negative for blurred vision.  Respiratory: as per HPI  Cardiovascular: Negative for chest pain and palpitations.  Gastrointestinal: Negative for vomiting, diarrhea, blood per rectum. Genitourinary: Negative for dysuria, urgency, frequency and hematuria.  Musculoskeletal: Negative for myalgias, back pain and joint pain.  Skin: Negative for itching and rash.  Neurological: Negative for dizziness, tremors, focal weakness, seizures and loss of consciousness.  Endo/Heme/Allergies: Negative for environmental allergies.  Psychiatric/Behavioral: Negative for depression, suicidal ideas and hallucinations.  All other systems reviewed and are negative.  Physical Exam: Blood pressure 122/76, pulse 100, height 5\' 10"  (1.778 m), weight 201 lb 9.6 oz (91.4 kg), SpO2 96 %. Gen:      No acute distress HEENT:  EOMI, sclera anicteric Neck:     No masses; no thyromegaly Lungs:    Clear to auscultation bilaterally; normal respiratory effort CV:         Regular rate and rhythm; no murmurs Abd:      + bowel sounds; soft, non-tender; no palpable masses, no distension Ext:    No edema; adequate peripheral perfusion Skin:      Warm and dry; no rash Neuro: alert and oriented x 3 Psych: normal mood and affect  Data Reviewed: CT chest 02/27/2018 5.8 x 4.9 cm mass in the right mediastinum and hilum with associated severe narrowing of the right main pulmonary artery as well as right upper lobe and right middle lobe arteries. Obstructed right main stem bronchus and collapse of the right  upper lobe and right middle lobe. Small right effusion. The appearance is similar to the patient's chest radiograph from  02/20/2018.  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   Decide on optimal site for biopsy based on the results He will likely need a bronchoscope with endobronchial ultrasound Discussed CT findings and next steps for work-up with the patient and his wife in office today.  Schedule pulmonary function tests for assessment of lung function.  Plan/Recommendations: - PET scan, PFTs.  Marshell Garfinkel MD [facility] 03/04/2018, 3:09 PM  CC: Bernerd Limbo, MD

## 2018-03-10 ENCOUNTER — Encounter (HOSPITAL_COMMUNITY)
Admission: RE | Admit: 2018-03-10 | Discharge: 2018-03-10 | Disposition: A | Discharge status: Home / Self Care | Payer: Medicare HMO | Source: Ambulatory Visit | Attending: Pulmonary Disease | Admitting: Pulmonary Disease

## 2018-03-10 DIAGNOSIS — R911 Solitary pulmonary nodule: Secondary | ICD-10-CM | POA: Diagnosis present

## 2018-03-10 LAB — GLUCOSE, CAPILLARY: Glucose-Capillary: 104 mg/dL — ABNORMAL HIGH (ref 65–99)

## 2018-03-10 MED ORDER — FLUDEOXYGLUCOSE F - 18 (FDG) INJECTION
10.0000 | Freq: Once | INTRAVENOUS | Status: AC | PRN
  Administered 2018-03-10: 10 via INTRAVENOUS

## 2018-03-12 ENCOUNTER — Telehealth: Payer: Self-pay | Admitting: Pulmonary Disease

## 2018-03-12 DIAGNOSIS — R918 Other nonspecific abnormal finding of lung field: Secondary | ICD-10-CM

## 2018-03-12 NOTE — Telephone Encounter (Signed)
EBUS has been ordered.  lmtcb x for pt's spouse, Mardene Celeste.

## 2018-03-12 NOTE — Telephone Encounter (Signed)
Called and discussed PET scan with patient.  He will need to be scheduled for Bronchoscope and EBUS with fluoro on 13,14th at [facility] or 19, 20, 21st at [facility]. Pt will check with his wife and call back with the date he can arrange transportation.   Marshell Garfinkel MD [facility] 03/12/2018, 11:05 AM

## 2018-03-12 NOTE — Telephone Encounter (Signed)
Pt wife Mardene Celeste is calling back [phone]

## 2018-03-12 NOTE — Telephone Encounter (Addendum)
Pt's spouse is aware that EBUS has been ordered. Advised Barry Booker that our office would contact her once bronch is scheduled.  Also made Barry Booker aware that pt should still come in on 6/14 for PFT.   Libby please let me know when EBUS is scheduled. Thanks

## 2018-03-12 NOTE — Telephone Encounter (Signed)
Pt wife is returning call. Per Mardene Celeste, pt would be able to do Bronch on June 19 at [facility]. Pt wife would like to know if pt should still come in for PFT that is sched for 6/14. Cb is [phone]

## 2018-03-12 NOTE — Telephone Encounter (Signed)
We will get this set up Joellen Jersey

## 2018-03-13 ENCOUNTER — Telehealth: Payer: Self-pay

## 2018-03-13 NOTE — Telephone Encounter (Signed)
lmtcb x1 for pt. 

## 2018-03-13 NOTE — Telephone Encounter (Signed)
It appears that Bronch as been scheduled for 03/21/18 at 1:00p. Please see 03/13/18 phone note.  Below dates are incorrect per [facility].   Will route to [place] per request.

## 2018-03-13 NOTE — Telephone Encounter (Signed)
Pt mother called. Per Mother, pt can be reached at [phone].

## 2018-03-13 NOTE — Telephone Encounter (Signed)
-----   Message from Marshell Garfinkel, MD sent at 03/13/2018 10:13 AM EDT ----- Regarding: RE: EBUS/Bronch Ok with me. Please check with patient and wife if they can arrange transport.   ----- Message ----- From: Ilona Sorrel Sent: 03/13/2018   9:47 AM To: Marshell Garfinkel, MD, Shon Hale, CMA Subject: EBUS/Bronch                                    I have just scheduled patient for Bronch/EBUS on 6/21 at 1:00 at [facility]. This is first available opening they had for the dates you gave [place].  Is this date and time ok?   Judeen Hammans

## 2018-03-13 NOTE — Telephone Encounter (Signed)
ebus @[facility]  03/24/18@7 :30am pt is aware, pet is@[facility]  03/19/18 Joellen Jersey

## 2018-03-13 NOTE — Telephone Encounter (Signed)
Correction to my note pt is scheduled 03/21/18@[facility] @1pm  for bronch/ebus and is aware Barry Booker

## 2018-03-13 NOTE — Telephone Encounter (Signed)
Called number provided. Unable to reach left message to give [place] a call back.

## 2018-03-14 ENCOUNTER — Ambulatory Visit (INDEPENDENT_AMBULATORY_CARE_PROVIDER_SITE_OTHER): Payer: Medicare HMO | Admitting: Pulmonary Disease

## 2018-03-14 DIAGNOSIS — R911 Solitary pulmonary nodule: Secondary | ICD-10-CM

## 2018-03-14 DIAGNOSIS — R918 Other nonspecific abnormal finding of lung field: Secondary | ICD-10-CM

## 2018-03-14 LAB — PULMONARY FUNCTION TEST
DL/VA % PRED: 95 %
DL/VA: 4.41 ml/min/mmHg/L
DLCO UNC % PRED: 40 %
DLCO UNC: 13.13 ml/min/mmHg
FEF 25-75 POST: 0.79 L/s
FEF 25-75 Pre: 0.68 L/sec
FEF2575-%CHANGE-POST: 15 %
FEF2575-%PRED-POST: 32 %
FEF2575-%Pred-Pre: 27 %
FEV1-%CHANGE-POST: 4 %
FEV1-%Pred-Post: 35 %
FEV1-%Pred-Pre: 33 %
FEV1-Post: 1.15 L
FEV1-Pre: 1.1 L
FEV1FVC-%Change-Post: 0 %
FEV1FVC-%PRED-PRE: 90 %
FEV6-%Change-Post: 4 %
FEV6-%PRED-POST: 41 %
FEV6-%Pred-Pre: 40 %
FEV6-Post: 1.74 L
FEV6-Pre: 1.66 L
FEV6FVC-%Pred-Post: 106 %
FEV6FVC-%Pred-Pre: 106 %
FVC-%Change-Post: 4 %
FVC-%PRED-POST: 39 %
FVC-%Pred-Pre: 37 %
FVC-PRE: 1.66 L
FVC-Post: 1.74 L
POST FEV1/FVC RATIO: 66 %
PRE FEV1/FVC RATIO: 66 %
PRE FEV6/FVC RATIO: 100 %
Post FEV6/FVC ratio: 100 %
RV % PRED: 170 %
RV: 4.2 L
TLC % pred: 82 %
TLC: 5.78 L

## 2018-03-14 NOTE — Telephone Encounter (Signed)
Spoke with Mardene Celeste and she states she received a call from pre-op and they are ok with that date for the bronchoscopy. FYI Dr. Vaughan Browner.

## 2018-03-14 NOTE — Telephone Encounter (Signed)
Pt's wife Mardene Celeste is calling back [phone]

## 2018-03-14 NOTE — Progress Notes (Signed)
PFT done today. 

## 2018-03-14 NOTE — Telephone Encounter (Signed)
lmtcb for pt's spouse, Mardene Celeste.

## 2018-03-18 NOTE — Pre-Procedure Instructions (Signed)
Barry Booker  03/18/2018      [facility] - [place], [place] - [address] [place] [address] Phone: [phone] Fax: [phone]  [id number] [place], [place] - [address] [facility] [address] [place] [address] Phone: [phone] Fax: [phone]    Your procedure is scheduled on Friday 03/21/2018.  Report to [facility] Admitting at 1100 A.M.  Call this number if you have problems the morning of surgery:  [phone]   Remember:  Do not eat or drink after midnight the night before your procedure.     Take these medicines the morning of surgery with A SIP OF WATER: Hydralazine (Apresoline) Ipratropium-Albuterol (Combivent IN) inhaler - if needed (Bring with you to the hospital) umeclidinium-vilanterol (Anoro Ellipta) inhaler  7 days prior to surgery STOP taking any Aleve, Naproxen, Ibuprofen, Motrin, Advil, Goody's, BC's, all herbal medications, fish oil, and all vitamins  Follow your doctors instructions regarding your Aspirin.  If no instructions were given by your doctor, then you will need to call your surgeon's office to get instructions.    WHAT DO I DO ABOUT MY DIABETES MEDICATION? [name] Do not take oral diabetes medicines (pills) the morning of surgery - DO NOT take your metformin the day of your procedure    How to Manage Your Diabetes Before and After Surgery  Why is it important to control my blood sugar before and after surgery? . Improving blood sugar levels before and after surgery helps healing and can limit problems. . A way of improving blood sugar control is eating a healthy diet by: o  Eating less sugar and carbohydrates o  Increasing activity/exercise o  Talking with your doctor about reaching your blood sugar goals . High blood sugars (greater than 180 mg/dL) can raise your risk of infections and slow your recovery, so you will need to focus  on controlling your diabetes during the weeks before surgery. . Make sure that the doctor who takes care of your diabetes knows about your planned surgery including the date and location.  How do I manage my blood sugar before surgery? . Check your blood sugar at least 4 times a day, starting 2 days before surgery, to make sure that the level is not too high or low. o Check your blood sugar the morning of your surgery when you wake up and every 2 hours until you get to the Short Stay unit. . If your blood sugar is less than 70 mg/dL, you will need to treat for low blood sugar: o Do not take insulin. o Treat a low blood sugar (less than 70 mg/dL) with  cup of clear juice (cranberry or apple), 4 glucose tablets, OR glucose gel. o Recheck blood sugar in 15 minutes after treatment (to make sure it is greater than 70 mg/dL). If your blood sugar is not greater than 70 mg/dL on recheck, call [phone] for further instructions. . Report your blood sugar to the short stay nurse when you get to Short Stay.  . If you are admitted to the hospital after surgery: o Your blood sugar will be checked by the staff and you will probably be given insulin after surgery (instead of oral diabetes medicines) to make sure you have good blood sugar levels. o The goal for blood sugar control after surgery is 80-180 mg/dL.    Do not wear jewelry.  Do not wear  lotions, powders, or colognes, or deodorant.  Men may shave face and neck.  Do not bring valuables to the hospital.  [facility] is not responsible for any belongings or valuables.  Hearing aids, eyeglasses, contacts, dentures or bridgework may not be worn into surgery.  Leave your suitcase in the car.  After surgery it may be brought to your room.  For patients admitted to the hospital, discharge time will be determined by your treatment team.  Patients discharged the day of surgery will not be allowed to drive home.   Name and phone number of your  driver:    Special instructions:   [name]- Preparing For Surgery  Before surgery, you can play an important role. Because skin is not sterile, your skin needs to be as free of germs as possible. You can reduce the number of germs on your skin by washing with CHG (chlorahexidine gluconate) Soap before surgery.  CHG is an antiseptic cleaner which kills germs and bonds with the skin to continue killing germs even after washing.    Oral Hygiene is also important to reduce your risk of infection.  Remember - BRUSH YOUR TEETH THE MORNING OF SURGERY WITH YOUR REGULAR TOOTHPASTE  Please do not use if you have an allergy to CHG or antibacterial soaps. If your skin becomes reddened/irritated stop using the CHG.  Do not shave (including legs and underarms) for at least 48 hours prior to first CHG shower. It is OK to shave your face.  Please follow these instructions carefully.   1. Shower the NIGHT BEFORE SURGERY and the MORNING OF SURGERY with CHG.   2. If you chose to wash your hair, wash your hair first as usual with your normal shampoo.  3. After you shampoo, rinse your hair and body thoroughly to remove the shampoo.  4. Use CHG as you would any other liquid soap. You can apply CHG directly to the skin and wash gently with a scrungie or a clean washcloth.   5. Apply the CHG Soap to your body ONLY FROM THE NECK DOWN.  Do not use on open wounds or open sores. Avoid contact with your eyes, ears, mouth and genitals (private parts). Wash Face and genitals (private parts)  with your normal soap.  6. Wash thoroughly, paying special attention to the area where your surgery will be performed.  7. Thoroughly rinse your body with warm water from the neck down.  8. DO NOT shower/wash with your normal soap after using and rinsing off the CHG Soap.  9. Pat yourself dry with a CLEAN TOWEL.  10. Wear CLEAN PAJAMAS to bed the night before surgery, wear comfortable clothes the morning of  surgery  11. Place CLEAN SHEETS on your bed the night of your first shower and DO NOT SLEEP WITH PETS.    Day of Surgery: Shower as stated above. Do not apply any deodorants/lotions.  Please wear clean clothes to the hospital/surgery center.   Remember to brush your teeth WITH YOUR REGULAR TOOTHPASTE.    Please read over the following fact sheets that you were given.

## 2018-03-19 ENCOUNTER — Other Ambulatory Visit: Payer: Self-pay

## 2018-03-19 ENCOUNTER — Encounter (HOSPITAL_COMMUNITY): Payer: Self-pay

## 2018-03-19 ENCOUNTER — Encounter (HOSPITAL_COMMUNITY)
Admission: RE | Admit: 2018-03-19 | Discharge: 2018-03-19 | Disposition: A | Discharge status: Home / Self Care | Payer: Medicare HMO | Source: Ambulatory Visit | Attending: Pulmonary Disease | Admitting: Pulmonary Disease

## 2018-03-19 DIAGNOSIS — E119 Type 2 diabetes mellitus without complications: Secondary | ICD-10-CM

## 2018-03-19 DIAGNOSIS — Z01812 Encounter for preprocedural laboratory examination: Secondary | ICD-10-CM

## 2018-03-19 DIAGNOSIS — I10 Essential (primary) hypertension: Secondary | ICD-10-CM

## 2018-03-19 DIAGNOSIS — Z01810 Encounter for preprocedural cardiovascular examination: Secondary | ICD-10-CM

## 2018-03-19 LAB — COMPREHENSIVE METABOLIC PANEL
ALBUMIN: 3 g/dL — AB (ref 3.5–5.0)
ALT: 8 U/L — ABNORMAL LOW (ref 17–63)
ANION GAP: 12 (ref 5–15)
AST: 13 U/L — ABNORMAL LOW (ref 15–41)
Alkaline Phosphatase: 51 U/L (ref 38–126)
BILIRUBIN TOTAL: 0.3 mg/dL (ref 0.3–1.2)
BUN: 8 mg/dL (ref 6–20)
CHLORIDE: 102 mmol/L (ref 101–111)
CO2: 27 mmol/L (ref 22–32)
Calcium: 9.5 mg/dL (ref 8.9–10.3)
Creatinine, Ser: 0.92 mg/dL (ref 0.61–1.24)
GFR calc Af Amer: >60 mL/min (ref >60)
Glucose, Bld: 113 mg/dL — ABNORMAL HIGH (ref 65–99)
POTASSIUM: 4 mmol/L (ref 3.5–5.1)
Sodium: 141 mmol/L (ref 135–145)
TOTAL PROTEIN: 7.8 g/dL (ref 6.5–8.1)

## 2018-03-19 LAB — CBC
HEMATOCRIT: 38.8 % — AB (ref 39.0–52.0)
Hemoglobin: 11.9 g/dL — ABNORMAL LOW (ref 13.0–17.0)
MCH: 27.5 pg (ref 26.0–34.0)
MCHC: 30.7 g/dL (ref 30.0–36.0)
MCV: 89.8 fL (ref 78.0–100.0)
PLATELETS: 259 10*3/uL (ref 150–400)
RBC: 4.32 MIL/uL (ref 4.22–5.81)
RDW: 13.6 % (ref 11.5–15.5)
WBC: 12.2 10*3/uL — AB (ref 4.0–10.5)

## 2018-03-19 LAB — PROTIME-INR
INR: 1.13
PROTHROMBIN TIME: 14.4 s (ref 11.4–15.2)

## 2018-03-19 LAB — APTT: APTT: 36 s (ref 24–36)

## 2018-03-19 LAB — GLUCOSE, CAPILLARY: Glucose-Capillary: 131 mg/dL — ABNORMAL HIGH (ref 65–99)

## 2018-03-19 NOTE — Progress Notes (Signed)
PCP - Dr. Coletta Memos Cardiologist - patient denies  Chest x-ray - 02/20/2018 EKG - 03/19/2018 Stress Test - patient is unsure ECHO - 01/19/2016 Cardiac Cath - patient denies  Sleep Study - patient denies  Fasting Blood Sugar - patient does not know.  He does not check his CBG at home.  A1C was 6.9 on 02/20/2018   Aspirin Instructions: per patient and his wife, no instructions were given on when to stop ASA.  Instructed to contact surgeon's office   Anesthesia review: n/a  Patient denies shortness of breath, fever, cough and chest pain at PAT appointment   Patient verbalized understanding of instructions that were given to them at the PAT appointment. Patient was also instructed that they will need to review over the PAT instructions again at home before surgery.

## 2018-03-21 ENCOUNTER — Inpatient Hospital Stay (HOSPITAL_COMMUNITY)
Admission: AD | Admit: 2018-03-21 | Discharge: 2018-03-24 | DRG: 166 | Disposition: A | Discharge status: Home / Self Care | Payer: Medicare HMO | Source: Ambulatory Visit | Attending: Pulmonary Disease | Admitting: Pulmonary Disease

## 2018-03-21 ENCOUNTER — Ambulatory Visit (HOSPITAL_COMMUNITY): Payer: Medicare HMO | Admitting: Certified Registered"

## 2018-03-21 ENCOUNTER — Encounter (HOSPITAL_COMMUNITY): Payer: Self-pay | Admitting: Certified Registered"

## 2018-03-21 ENCOUNTER — Encounter (HOSPITAL_COMMUNITY)
Admission: AD | Disposition: A | Discharge status: Home / Self Care | Payer: Self-pay | Source: Ambulatory Visit | Attending: Pulmonary Disease

## 2018-03-21 ENCOUNTER — Ambulatory Visit (HOSPITAL_COMMUNITY): Payer: Medicare HMO

## 2018-03-21 ENCOUNTER — Ambulatory Visit
Admission: RE | Admit: 2018-03-21 | Discharge: 2018-03-21 | Disposition: A | Discharge status: Home / Self Care | Payer: Medicare HMO | Source: Ambulatory Visit | Attending: Radiation Oncology | Admitting: Radiation Oncology

## 2018-03-21 ENCOUNTER — Other Ambulatory Visit: Payer: Self-pay

## 2018-03-21 DIAGNOSIS — I110 Hypertensive heart disease with heart failure: Secondary | ICD-10-CM | POA: Diagnosis present

## 2018-03-21 DIAGNOSIS — J9601 Acute respiratory failure with hypoxia: Secondary | ICD-10-CM | POA: Diagnosis present

## 2018-03-21 DIAGNOSIS — C3401 Malignant neoplasm of right main bronchus: Secondary | ICD-10-CM | POA: Diagnosis present

## 2018-03-21 DIAGNOSIS — Z809 Family history of malignant neoplasm, unspecified: Secondary | ICD-10-CM | POA: Diagnosis not present

## 2018-03-21 DIAGNOSIS — F5000 Anorexia nervosa, unspecified: Secondary | ICD-10-CM

## 2018-03-21 DIAGNOSIS — I5032 Chronic diastolic (congestive) heart failure: Secondary | ICD-10-CM | POA: Diagnosis present

## 2018-03-21 DIAGNOSIS — M109 Gout, unspecified: Secondary | ICD-10-CM | POA: Diagnosis present

## 2018-03-21 DIAGNOSIS — Z7982 Long term (current) use of aspirin: Secondary | ICD-10-CM

## 2018-03-21 DIAGNOSIS — Z881 Allergy status to other antibiotic agents status: Secondary | ICD-10-CM

## 2018-03-21 DIAGNOSIS — Z79899 Other long term (current) drug therapy: Secondary | ICD-10-CM | POA: Diagnosis not present

## 2018-03-21 DIAGNOSIS — R918 Other nonspecific abnormal finding of lung field: Secondary | ICD-10-CM | POA: Diagnosis not present

## 2018-03-21 DIAGNOSIS — Z7984 Long term (current) use of oral hypoglycemic drugs: Secondary | ICD-10-CM | POA: Diagnosis not present

## 2018-03-21 DIAGNOSIS — Z09 Encounter for follow-up examination after completed treatment for conditions other than malignant neoplasm: Secondary | ICD-10-CM

## 2018-03-21 DIAGNOSIS — Z801 Family history of malignant neoplasm of trachea, bronchus and lung: Secondary | ICD-10-CM

## 2018-03-21 DIAGNOSIS — J9819 Other pulmonary collapse: Secondary | ICD-10-CM | POA: Diagnosis present

## 2018-03-21 DIAGNOSIS — E785 Hyperlipidemia, unspecified: Secondary | ICD-10-CM | POA: Diagnosis present

## 2018-03-21 DIAGNOSIS — J988 Other specified respiratory disorders: Secondary | ICD-10-CM

## 2018-03-21 DIAGNOSIS — I10 Essential (primary) hypertension: Secondary | ICD-10-CM | POA: Diagnosis not present

## 2018-03-21 DIAGNOSIS — R634 Abnormal weight loss: Secondary | ICD-10-CM | POA: Diagnosis not present

## 2018-03-21 DIAGNOSIS — J449 Chronic obstructive pulmonary disease, unspecified: Secondary | ICD-10-CM | POA: Diagnosis present

## 2018-03-21 DIAGNOSIS — F1010 Alcohol abuse, uncomplicated: Secondary | ICD-10-CM | POA: Diagnosis not present

## 2018-03-21 DIAGNOSIS — R0603 Acute respiratory distress: Secondary | ICD-10-CM | POA: Diagnosis present

## 2018-03-21 DIAGNOSIS — Z419 Encounter for procedure for purposes other than remedying health state, unspecified: Secondary | ICD-10-CM

## 2018-03-21 DIAGNOSIS — J9600 Acute respiratory failure, unspecified whether with hypoxia or hypercapnia: Secondary | ICD-10-CM

## 2018-03-21 DIAGNOSIS — E119 Type 2 diabetes mellitus without complications: Secondary | ICD-10-CM | POA: Diagnosis present

## 2018-03-21 DIAGNOSIS — Z87891 Personal history of nicotine dependence: Secondary | ICD-10-CM

## 2018-03-21 DIAGNOSIS — C3491 Malignant neoplasm of unspecified part of right bronchus or lung: Secondary | ICD-10-CM | POA: Diagnosis not present

## 2018-03-21 DIAGNOSIS — Z6828 Body mass index (BMI) 28.0-28.9, adult: Secondary | ICD-10-CM | POA: Diagnosis not present

## 2018-03-21 HISTORY — PX: VIDEO BRONCHOSCOPY WITH ENDOBRONCHIAL ULTRASOUND: SHX6177

## 2018-03-21 HISTORY — PX: FLEXIBLE BRONCHOSCOPY: SHX5094

## 2018-03-21 LAB — CBC WITH DIFFERENTIAL/PLATELET
ABS IMMATURE GRANULOCYTES: 0.1 10*3/uL (ref 0.0–0.1)
BASOS ABS: 0 10*3/uL (ref 0.0–0.1)
BASOS PCT: 0 %
Eosinophils Absolute: 0 10*3/uL (ref 0.0–0.7)
Eosinophils Relative: 0 %
HCT: 37.7 % — ABNORMAL LOW (ref 39.0–52.0)
Hemoglobin: 11.5 g/dL — ABNORMAL LOW (ref 13.0–17.0)
IMMATURE GRANULOCYTES: 0 %
Lymphocytes Relative: 3 %
Lymphs Abs: 0.5 10*3/uL — ABNORMAL LOW (ref 0.7–4.0)
MCH: 27.6 pg (ref 26.0–34.0)
MCHC: 30.5 g/dL (ref 30.0–36.0)
MCV: 90.6 fL (ref 78.0–100.0)
Monocytes Absolute: 0.1 10*3/uL (ref 0.1–1.0)
Monocytes Relative: 1 %
Neutro Abs: 13.3 10*3/uL — ABNORMAL HIGH (ref 1.7–7.7)
Neutrophils Relative %: 96 %
PLATELETS: 209 10*3/uL (ref 150–400)
RBC: 4.16 MIL/uL — AB (ref 4.22–5.81)
RDW: 13.8 % (ref 11.5–15.5)
WBC: 14 10*3/uL — ABNORMAL HIGH (ref 4.0–10.5)

## 2018-03-21 LAB — BASIC METABOLIC PANEL
ANION GAP: 10 (ref 5–15)
BUN: 12 mg/dL (ref 6–20)
CHLORIDE: 102 mmol/L (ref 101–111)
CO2: 26 mmol/L (ref 22–32)
Calcium: 9 mg/dL (ref 8.9–10.3)
Creatinine, Ser: 0.96 mg/dL (ref 0.61–1.24)
GFR calc non Af Amer: >60 mL/min (ref >60)
Glucose, Bld: 193 mg/dL — ABNORMAL HIGH (ref 65–99)
Potassium: 4 mmol/L (ref 3.5–5.1)
SODIUM: 138 mmol/L (ref 135–145)

## 2018-03-21 LAB — COMPREHENSIVE METABOLIC PANEL
ALK PHOS: 53 U/L (ref 38–126)
ALT: 8 U/L — ABNORMAL LOW (ref 17–63)
ANION GAP: 12 (ref 5–15)
AST: 15 U/L (ref 15–41)
Albumin: 3 g/dL — ABNORMAL LOW (ref 3.5–5.0)
BILIRUBIN TOTAL: 0.9 mg/dL (ref 0.3–1.2)
BUN: 11 mg/dL (ref 6–20)
CALCIUM: 8.9 mg/dL (ref 8.9–10.3)
CO2: 25 mmol/L (ref 22–32)
CREATININE: 1 mg/dL (ref 0.61–1.24)
Chloride: 101 mmol/L (ref 101–111)
Glucose, Bld: 190 mg/dL — ABNORMAL HIGH (ref 65–99)
Potassium: 4.4 mmol/L (ref 3.5–5.1)
SODIUM: 138 mmol/L (ref 135–145)
TOTAL PROTEIN: 7.8 g/dL (ref 6.5–8.1)

## 2018-03-21 LAB — GLUCOSE, CAPILLARY
GLUCOSE-CAPILLARY: 144 mg/dL — AB (ref 65–99)
GLUCOSE-CAPILLARY: 168 mg/dL — AB (ref 65–99)
GLUCOSE-CAPILLARY: 169 mg/dL — AB (ref 65–99)
Glucose-Capillary: 107 mg/dL — ABNORMAL HIGH (ref 65–99)

## 2018-03-21 LAB — PROTIME-INR
INR: 1.13
Prothrombin Time: 14.4 seconds (ref 11.4–15.2)

## 2018-03-21 LAB — PHOSPHORUS: PHOSPHORUS: 4 mg/dL (ref 2.5–4.6)

## 2018-03-21 LAB — MAGNESIUM: Magnesium: 1.9 mg/dL (ref 1.7–2.4)

## 2018-03-21 LAB — MRSA PCR SCREENING: MRSA by PCR: NEGATIVE

## 2018-03-21 LAB — PROCALCITONIN

## 2018-03-21 SURGERY — BRONCHOSCOPY, WITH EBUS
Anesthesia: General

## 2018-03-21 MED ORDER — LIDOCAINE 2% (20 MG/ML) 5 ML SYRINGE
INTRAMUSCULAR | Status: DC | PRN
  Administered 2018-03-21: 60 mg via INTRAVENOUS

## 2018-03-21 MED ORDER — SUFENTANIL CITRATE 50 MCG/ML IV SOLN
INTRAVENOUS | Status: AC
  Filled 2018-03-21: qty 1

## 2018-03-21 MED ORDER — LACTATED RINGERS IV SOLN
INTRAVENOUS | Status: DC
  Administered 2018-03-21: 17:00:00 via INTRAVENOUS
  Administered 2018-03-21: 10 mL/h via INTRAVENOUS

## 2018-03-21 MED ORDER — FENTANYL CITRATE (PF) 100 MCG/2ML IJ SOLN: 25.0000 ug | INTRAMUSCULAR | Status: DC | PRN

## 2018-03-21 MED ORDER — ARFORMOTEROL TARTRATE 15 MCG/2ML IN NEBU
15.0000 ug | INHALATION_SOLUTION | Freq: Two times a day (BID) | RESPIRATORY_TRACT | Status: DC
  Administered 2018-03-21: 15 ug via RESPIRATORY_TRACT
  Filled 2018-03-21 (×2): qty 2

## 2018-03-21 MED ORDER — DEXAMETHASONE SODIUM PHOSPHATE 10 MG/ML IJ SOLN
INTRAMUSCULAR | Status: DC | PRN
  Administered 2018-03-21: 10 mg via INTRAVENOUS

## 2018-03-21 MED ORDER — ONDANSETRON HCL 4 MG/2ML IJ SOLN
INTRAMUSCULAR | Status: AC
  Filled 2018-03-21: qty 2

## 2018-03-21 MED ORDER — SUFENTANIL CITRATE 50 MCG/ML IV SOLN
INTRAVENOUS | Status: DC | PRN
  Administered 2018-03-21: 5 ug via INTRAVENOUS
  Administered 2018-03-21: 10 ug via INTRAVENOUS

## 2018-03-21 MED ORDER — DEXAMETHASONE SODIUM PHOSPHATE 10 MG/ML IJ SOLN
INTRAMUSCULAR | Status: AC
  Filled 2018-03-21: qty 1

## 2018-03-21 MED ORDER — PHENYLEPHRINE 40 MCG/ML (10ML) SYRINGE FOR IV PUSH (FOR BLOOD PRESSURE SUPPORT)
PREFILLED_SYRINGE | INTRAVENOUS | Status: AC
Start: 2018-03-21 — End: ?
  Filled 2018-03-21: qty 20

## 2018-03-21 MED ORDER — IPRATROPIUM-ALBUTEROL 0.5-2.5 (3) MG/3ML IN SOLN: 3.0000 mL | Freq: Once | RESPIRATORY_TRACT | Status: DC

## 2018-03-21 MED ORDER — ATROPINE SULFATE 1 % OP SOLN
OPHTHALMIC | Status: AC
  Filled 2018-03-21: qty 5

## 2018-03-21 MED ORDER — ATORVASTATIN CALCIUM 40 MG PO TABS
40.0000 mg | ORAL_TABLET | Freq: Every day | ORAL | Status: DC
  Administered 2018-03-21 – 2018-03-23 (×3): 40 mg via ORAL
  Filled 2018-03-21 (×3): qty 1

## 2018-03-21 MED ORDER — ONDANSETRON HCL 4 MG/2ML IJ SOLN
INTRAMUSCULAR | Status: DC | PRN
  Administered 2018-03-21: 4 mg via INTRAVENOUS

## 2018-03-21 MED ORDER — IPRATROPIUM-ALBUTEROL 0.5-2.5 (3) MG/3ML IN SOLN
RESPIRATORY_TRACT | Status: AC
  Administered 2018-03-21: 3 mL
  Filled 2018-03-21: qty 3

## 2018-03-21 MED ORDER — HEPARIN SODIUM (PORCINE) 5000 UNIT/ML IJ SOLN
5000.0000 [IU] | Freq: Three times a day (TID) | INTRAMUSCULAR | Status: DC
  Administered 2018-03-21 – 2018-03-24 (×7): 5000 [IU] via SUBCUTANEOUS
  Filled 2018-03-21 (×8): qty 1

## 2018-03-21 MED ORDER — HYDRALAZINE HCL 10 MG PO TABS
10.0000 mg | ORAL_TABLET | Freq: Two times a day (BID) | ORAL | Status: DC
  Administered 2018-03-21 – 2018-03-24 (×6): 10 mg via ORAL
  Filled 2018-03-21 (×6): qty 1

## 2018-03-21 MED ORDER — FAMOTIDINE 20 MG PO TABS
20.0000 mg | ORAL_TABLET | Freq: Two times a day (BID) | ORAL | Status: DC
  Administered 2018-03-21: 20 mg via ORAL
  Filled 2018-03-21: qty 1

## 2018-03-21 MED ORDER — FOLIC ACID 1 MG PO TABS
500.0000 ug | ORAL_TABLET | Freq: Every day | ORAL | Status: DC
  Administered 2018-03-22 – 2018-03-24 (×3): 0.5 mg via ORAL
  Filled 2018-03-21 (×4): qty 1

## 2018-03-21 MED ORDER — ASPIRIN EC 81 MG PO TBEC
81.0000 mg | DELAYED_RELEASE_TABLET | Freq: Every day | ORAL | Status: DC
  Administered 2018-03-22 – 2018-03-24 (×3): 81 mg via ORAL
  Filled 2018-03-21 (×3): qty 1

## 2018-03-21 MED ORDER — ROCURONIUM BROMIDE 10 MG/ML (PF) SYRINGE
PREFILLED_SYRINGE | INTRAVENOUS | Status: DC | PRN
  Administered 2018-03-21: 40 mg via INTRAVENOUS
  Administered 2018-03-21: 10 mg via INTRAVENOUS

## 2018-03-21 MED ORDER — SODIUM CHLORIDE 0.9 % IV SOLN: 250.0000 mL | INTRAVENOUS | Status: DC | PRN

## 2018-03-21 MED ORDER — PROPOFOL 10 MG/ML IV BOLUS
INTRAVENOUS | Status: AC
  Filled 2018-03-21: qty 20

## 2018-03-21 MED ORDER — INSULIN ASPART 100 UNIT/ML ~~LOC~~ SOLN
0.0000 [IU] | SUBCUTANEOUS | Status: DC
  Administered 2018-03-21 (×2): 2 [IU] via SUBCUTANEOUS
  Administered 2018-03-22: 1 [IU] via SUBCUTANEOUS

## 2018-03-21 MED ORDER — IPRATROPIUM-ALBUTEROL 0.5-2.5 (3) MG/3ML IN SOLN
3.0000 mL | RESPIRATORY_TRACT | Status: DC | PRN
  Administered 2018-03-23: 3 mL via RESPIRATORY_TRACT
  Filled 2018-03-21: qty 3

## 2018-03-21 MED ORDER — EPINEPHRINE PF 1 MG/ML IJ SOLN
INTRAMUSCULAR | Status: AC
  Filled 2018-03-21: qty 1

## 2018-03-21 MED ORDER — FUROSEMIDE 20 MG PO TABS
20.0000 mg | ORAL_TABLET | Freq: Two times a day (BID) | ORAL | Status: DC
  Administered 2018-03-21 – 2018-03-24 (×6): 20 mg via ORAL
  Filled 2018-03-21 (×6): qty 1

## 2018-03-21 MED ORDER — BUDESONIDE 0.5 MG/2ML IN SUSP
0.5000 mg | Freq: Two times a day (BID) | RESPIRATORY_TRACT | Status: DC
  Administered 2018-03-21: 0.5 mg via RESPIRATORY_TRACT
  Filled 2018-03-21 (×2): qty 2

## 2018-03-21 MED ORDER — PROPOFOL 10 MG/ML IV BOLUS
INTRAVENOUS | Status: DC | PRN
  Administered 2018-03-21: 100 mg via INTRAVENOUS

## 2018-03-21 MED ORDER — MIDAZOLAM HCL 5 MG/5ML IJ SOLN
INTRAMUSCULAR | Status: DC | PRN
  Administered 2018-03-21: 2 mg via INTRAVENOUS

## 2018-03-21 MED ORDER — PHENYLEPHRINE 40 MCG/ML (10ML) SYRINGE FOR IV PUSH (FOR BLOOD PRESSURE SUPPORT)
PREFILLED_SYRINGE | INTRAVENOUS | Status: DC | PRN
  Administered 2018-03-21 (×7): 80 ug via INTRAVENOUS

## 2018-03-21 MED ORDER — 0.9 % SODIUM CHLORIDE (POUR BTL) OPTIME
TOPICAL | Status: DC | PRN
  Administered 2018-03-21: 1000 mL

## 2018-03-21 MED ORDER — SODIUM CHLORIDE 0.9 % IJ SOLN
INTRAMUSCULAR | Status: AC
  Filled 2018-03-21: qty 10

## 2018-03-21 MED ORDER — MIDAZOLAM HCL 2 MG/2ML IJ SOLN
INTRAMUSCULAR | Status: AC
  Filled 2018-03-21: qty 2

## 2018-03-21 SURGICAL SUPPLY — 27 items
BRUSH CYTOL CELLEBRITY 1.5X140 (MISCELLANEOUS) ×2 IMPLANT
CANISTER SUCT 3000ML PPV (MISCELLANEOUS) ×3 IMPLANT
CONT SPEC 4OZ CLIKSEAL STRL BL (MISCELLANEOUS) ×3 IMPLANT
COVER BACK TABLE 60X90IN (DRAPES) ×3 IMPLANT
COVER DOME SNAP 22 D (MISCELLANEOUS) ×3 IMPLANT
FILTER STRAW FLUID ASPIR (MISCELLANEOUS) IMPLANT
FORCEPS BIOP RJ4 1.8 (CUTTING FORCEPS) ×2 IMPLANT
GAUZE SPONGE 4X4 12PLY STRL (GAUZE/BANDAGES/DRESSINGS) ×3 IMPLANT
GLOVE BIO SURGEON STRL SZ7.5 (GLOVE) ×3 IMPLANT
GOWN STRL REUS W/ TWL LRG LVL3 (GOWN DISPOSABLE) ×2 IMPLANT
GOWN STRL REUS W/TWL LRG LVL3 (GOWN DISPOSABLE) ×6
KIT CLEAN ENDO COMPLIANCE (KITS) ×6 IMPLANT
KIT TURNOVER KIT B (KITS) ×3 IMPLANT
MARKER SKIN DUAL TIP RULER LAB (MISCELLANEOUS) ×3 IMPLANT
NEEDLE SONO TIP II EBUS (NEEDLE) ×3 IMPLANT
NS IRRIG 1000ML POUR BTL (IV SOLUTION) ×3 IMPLANT
OIL SILICONE PENTAX (PARTS (SERVICE/REPAIRS)) ×3 IMPLANT
PAD ARMBOARD 7.5X6 YLW CONV (MISCELLANEOUS) ×6 IMPLANT
SYR 20CC LL (SYRINGE) ×4 IMPLANT
SYR 20ML ECCENTRIC (SYRINGE) ×9 IMPLANT
SYR 3ML LL SCALE MARK (SYRINGE) IMPLANT
SYR 5ML LUER SLIP (SYRINGE) ×3 IMPLANT
TOWEL OR 17X24 6PK STRL BLUE (TOWEL DISPOSABLE) ×3 IMPLANT
TRAP SPECIMEN MUCOUS 40CC (MISCELLANEOUS) IMPLANT
TUBE CONNECTING 20'X1/4 (TUBING) ×1
TUBE CONNECTING 20X1/4 (TUBING) ×2 IMPLANT
WATER STERILE IRR 1000ML POUR (IV SOLUTION) ×3 IMPLANT

## 2018-03-21 NOTE — Transfer of Care (Signed)
Immediate Anesthesia Transfer of Care Note  Patient: NAMISH KRISE  Procedure(s) Performed: VIDEO BRONCHOSCOPY WITH ENDOBRONCHIAL ULTRASOUND ([name] ) FLEXIBLE BRONCHOSCOPY ([name] )  Patient Location: PACU  Anesthesia Type:General  Level of Consciousness: awake and patient cooperative  Airway & Oxygen Therapy: Patient Spontanous Breathing and Patient connected to face mask oxygen  Post-op Assessment: Report given to RN, Post -op Vital signs reviewed and stable and Patient moving all extremities  Post vital signs: Reviewed and stable  Last Vitals:  Vitals Value Taken Time  BP 155/89 03/21/2018  3:01 PM  Temp    Pulse 89 03/21/2018  3:02 PM  Resp 28 03/21/2018  3:02 PM  SpO2 99 % 03/21/2018  3:02 PM  Vitals shown include unvalidated device data.  Last Pain:  Vitals:   03/21/18 1111  TempSrc:   PainSc: 0-No pain      Patients Stated Pain Goal: 3 (81/77/11 6579)  Complications: No apparent anesthesia complications

## 2018-03-21 NOTE — Anesthesia Preprocedure Evaluation (Addendum)
Anesthesia Evaluation  Patient identified by MRN, date of birth, ID band Patient awake    Reviewed: Allergy & Precautions, H&P , NPO status , Patient's Chart, lab work & pertinent test results  Airway Mallampati: II  TM Distance: >3 FB Neck ROM: Full    Dental no notable dental hx. (+) Edentulous Upper, Edentulous Lower, Dental Advisory Given   Pulmonary COPD,  COPD inhaler, former smoker,  Lung mass    Pulmonary exam normal breath sounds clear to auscultation       Cardiovascular hypertension, Pt. on medications  Rhythm:Regular Rate:Normal     Neuro/Psych negative neurological ROS  negative psych ROS   GI/Hepatic negative GI ROS, Neg liver ROS,   Endo/Other  diabetes, Type 2, Oral Hypoglycemic Agents  Renal/GU negative Renal ROS  negative genitourinary   Musculoskeletal   Abdominal   Peds  Hematology negative hematology ROS (+)   Anesthesia Other Findings   Reproductive/Obstetrics negative OB ROS                            Anesthesia Physical Anesthesia Plan  ASA: II  Anesthesia Plan: General   Post-op Pain Management:    Induction: Intravenous  PONV Risk Score and Plan: 3 and Ondansetron, Dexamethasone and Midazolam  Airway Management Planned: Oral ETT  Additional Equipment:   Intra-op Plan:   Post-operative Plan: Extubation in OR  Informed Consent: I have reviewed the patients History and Physical, chart, labs and discussed the procedure including the risks, benefits and alternatives for the proposed anesthesia with the patient or authorized representative who has indicated his/her understanding and acceptance.   Dental advisory given  Plan Discussed with: CRNA  Anesthesia Plan Comments:         Anesthesia Quick Evaluation

## 2018-03-21 NOTE — H&P (Signed)
Barry Booker    [id number]    25-Mar-1946  Primary Care Physician:Bouska, Shanon Brow, MD  Referring Physician: No referring provider defined for this encounter.  Chief complaint: Follow up for lung mass  HPI: 72 year old with COPD, hypertension, hyperlipidemia, diastolic heart failure He had a chest x-ray which showed right upper lobe collapse primary care.  A follow-up CT showed mediastinal mass with right upper lobe bronchial narrowing and collapse and has been referred to pulmonary for further evaluation Chief complaint is cough, mucus production, dyspnea with activity and at rest.  He reports ongoing weakness, loss of weight and appetite for the past few months Denies any hemoptysis, fevers, chills.  Pets:No pets Occupation: Retired Games developer Exposures: No known exposures, no mold, hot tub Smoking history: 50-pack-year smoking history.  Quit in October 2018 Travel history: No significant travel Relevant family history: Father died of lung cancer.  Interim history: Patient examined in the preop area prior to bronchoscopy.  Has no new complaints.  Outpatient Encounter Medications as of 03/04/2018  Medication Sig  . aspirin 81 MG tablet Take 81 mg by mouth daily.  [name] atorvastatin (LIPITOR) 40 MG tablet Take 40 mg by mouth daily.   . benazepril (LOTENSIN) 20 MG tablet Take 20 mg by mouth daily.   . Calcium Carbonate (CALTRATE 600 PO) Take 1 tablet by mouth daily.   [name] FOLIC ACID PO Take 1 tablet by mouth daily.   . furosemide (LASIX) 40 MG tablet Take 40 mg by mouth 2 (two) times daily.   . hydrALAZINE (APRESOLINE) 10 MG tablet TAKE 1 TABLET BY MOUTH TWICE DAILY  . metFORMIN (GLUCOPHAGE) 500 MG tablet Take 500 mg by mouth 2 (two) times daily with a meal.   . potassium chloride SA (K-DUR,KLOR-CON) 20 MEQ tablet Take 20 mEq by mouth daily.   [name] umeclidinium-vilanterol (ANORO ELLIPTA) 62.5-25 MCG/INH AEPB Inhale into the lungs.   No facility-administered encounter medications  on file as of 03/04/2018.     Allergies as of 03/13/2018 - Review Complete 03/04/2018  Allergen Reaction Noted  . Neomycin-bacitracin zn-polymyx Rash 11/05/2006    Past Medical History:  Diagnosis Date  . Alcohol abuse   . COPD (chronic obstructive pulmonary disease) ([place])   . Diabetes mellitus without complication ([place])   . Gout   . High cholesterol   . Hypertension     Past Surgical History:  Procedure Laterality Date  . COLONOSCOPY    . NOSE SURGERY  90's    Family History  Problem Relation Age of Onset  . Arthritis Mother   . Cancer Father   . Cancer Sister   . Heart disease Maternal Uncle   . Colon cancer Neg Hx     Social History   Socioeconomic History  . Marital status: Married    Spouse name: Not on file  . Number of children: Not on file  . Years of education: Not on file  . Highest education level: Not on file  Occupational History  . Not on file  Social Needs  . Financial resource strain: Not on file  . Food insecurity:    Worry: Not on file    Inability: Not on file  . Transportation needs:    Medical: Not on file    Non-medical: Not on file  Tobacco Use  . Smoking status: Former Smoker    Packs/day: 0.25    Years: 50.00    Pack years: 12.50    Types: Cigarettes  Last attempt to quit: 07/2017    Years since quitting: 0.7  . Smokeless tobacco: Never Used  . Tobacco comment: quit smoking 07/2017  Substance and Sexual Activity  . Alcohol use: Yes    Alcohol/week: 8.4 - 12.6 oz    Types: 14 - 21 Cans of beer per week  . Drug use: No  . Sexual activity: Not on file  Lifestyle  . Physical activity:    Days per week: Not on file    Minutes per session: Not on file  . Stress: Not on file  Relationships  . Social connections:    Talks on phone: Not on file    Gets together: Not on file    Attends religious service: Not on file    Active member of club or organization: Not on file    Attends meetings of clubs or organizations: Not on  file    Relationship status: Not on file  . Intimate partner violence:    Fear of current or ex partner: Not on file    Emotionally abused: Not on file    Physically abused: Not on file    Forced sexual activity: Not on file  Other Topics Concern  . Not on file  Social History Narrative  . Not on file    Review of systems: Review of Systems  Constitutional: Negative for fever and chills.  HENT: Negative.   Eyes: Negative for blurred vision.  Respiratory: as per HPI  Cardiovascular: Negative for chest pain and palpitations.  Gastrointestinal: Negative for vomiting, diarrhea, blood per rectum. Genitourinary: Negative for dysuria, urgency, frequency and hematuria.  Musculoskeletal: Negative for myalgias, back pain and joint pain.  Skin: Negative for itching and rash.  Neurological: Negative for dizziness, tremors, focal weakness, seizures and loss of consciousness.  Endo/Heme/Allergies: Negative for environmental allergies.  Psychiatric/Behavioral: Negative for depression, suicidal ideas and hallucinations.  All other systems reviewed and are negative.  Physical Exam: Blood pressure 131/84, pulse 91, temperature 98.1 F (36.7 C), temperature source Oral, resp. rate 18, height 5\' 10"  (1.778 m), weight 208 lb (94.3 kg), SpO2 97 %. Gen:      No acute distress HEENT:  EOMI, sclera anicteric Neck:     No masses; no thyromegaly Lungs:    Clear to auscultation bilaterally; normal respiratory effort CV:         Regular rate and rhythm; no murmurs Abd:      + bowel sounds; soft, non-tender; no palpable masses, no distension Ext:    No edema; adequate peripheral perfusion Skin:      Warm and dry; no rash Neuro: alert and oriented x 3 Psych: normal mood and affect  Data Reviewed: CT chest 02/27/2018 5.8 x 4.9 cm mass in the right mediastinum and hilum with associated severe narrowing of the right main pulmonary artery as well as right upper lobe and right middle lobe arteries.  Obstructed right main stem bronchus and collapse of the right  upper lobe and right middle lobe. Small right effusion. The appearance is similar to the patient's chest radiograph from 02/20/2018.  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 Labs reviewed Okay to go ahead with procedure.  Marshell Garfinkel MD [facility] 03/21/2018, 12:43 PM

## 2018-03-21 NOTE — Anesthesia Procedure Notes (Signed)
Procedure Name: Intubation Date/Time: 03/21/2018 1:00 PM Performed by: Moshe Salisbury, CRNA Pre-anesthesia Checklist: Patient identified, Emergency Drugs available, Suction available and Patient being monitored Patient Re-evaluated:Patient Re-evaluated prior to induction Oxygen Delivery Method: Circle System Utilized Preoxygenation: Pre-oxygenation with 100% oxygen Induction Type: IV induction Ventilation: Mask ventilation without difficulty Laryngoscope Size: Mac and 4 Grade View: Grade I Tube type: Oral Tube size: 9.0 mm Number of attempts: 1 Airway Equipment and Method: Stylet Placement Confirmation: ETT inserted through vocal cords under direct vision,  positive ETCO2 and breath sounds checked- equal and bilateral Secured at: 20 cm Tube secured with: Tape Dental Injury: Teeth and Oropharynx as per pre-operative assessment

## 2018-03-21 NOTE — Progress Notes (Addendum)
Patient arrived to PACU irritable and pulling at his clothing across his chest, obvious labored breathing.  Arrived on nasal cannula at 4L. Continued to pull at clothing and seemed very irritable and labored breathing.  Sats dropped to mid 80's, switched to simple mask at 6L, Sats continued to drop to 70's, patient's color became bluish. Oral airway placed, pt laid flat, CRNA began ambu bagging, Sats raised back to 90's, color returned pink. Pt stabilized in upper 90's, on simple mask at 6L, pink color, alert & oriented, breathing still appears labored to RN, breathing from belly. Royce Macadamia, MD at the bedside during this episode. Mannam, MD arrived shortly after to evaluate pt, RN informed him of episode, MD placing admission orders at the bedside. Chest xray completed as ordered.  Duo Neb completed as ordered.  Respiratory contacted to place on BiPap as ordered.

## 2018-03-21 NOTE — Progress Notes (Signed)
RT transported patient from cath lab to room 1O17 with no complications. Vitals are stable and RT in unit has been given report. RT will continue to monitor.

## 2018-03-21 NOTE — Consult Note (Signed)
Radiation Oncology         [phone] ________________________________  Name: CORDARO MUKAI MRN: [id number]  Date: 03/13/2018  DOB: 09-20-1946  CC:Bernerd Limbo, MD  No ref. provider found     REFERRING PHYSICIAN: No ref. provider found   DIAGNOSIS: Diagnoses of Surgery, elective and Surgery follow-up were pertinent to this visit.   HISTORY OF PRESENT ILLNESS::Barry Booker is a 72 y.o. male who is seen for an initial consultation visit regarding the patient's diagnosis of presumed right sided lung cancer.  The patient had a previous chest x-ray that showed right upper lobe collapse and a follow-up CT scan showed a mediastinal mass with right upper lobe narrowing and collapse.  A subsequent PET scan on 03/10/2018 demonstrated a large intensely hypermetabolic right hilar mass obstructing the right upper lobe bronchus and surrounding the bronchus intermedius.  Postobstructive collapse of the right upper lobe was present.  No hypermetabolic mediastinal or supraclavicular lymph nodes were seen and no evidence of metastatic disease elsewhere was seen also.  The patient has undergone a bronchoscopy earlier today which showed a tumor which could not be traversed in the right mainstem bronchus.  Multiple biopsies were obtained and I have been asked to see the patient today for consideration of possible radiation treatment as we move forward.    PREVIOUS RADIATION THERAPY: No   PAST MEDICAL HISTORY:  has a past medical history of Alcohol abuse, COPD (chronic obstructive pulmonary disease) ([place]), Diabetes mellitus without complication ([place]), Gout, High cholesterol, and Hypertension.     PAST SURGICAL HISTORY: Past Surgical History:  Procedure Laterality Date  . COLONOSCOPY    . NOSE SURGERY  90's     FAMILY HISTORY: family history includes Arthritis in his mother; Cancer in his father and sister; Heart disease in his maternal uncle.   SOCIAL HISTORY:  reports that he quit smoking about 8  months ago. His smoking use included cigarettes. He has a 12.50 pack-year smoking history. He has never used smokeless tobacco. He reports that he drinks about 8.4 - 12.6 oz of alcohol per week. He reports that he does not use drugs.   ALLERGIES: Neomycin-bacitracin zn-polymyx   MEDICATIONS:  Current Facility-Administered Medications  Medication Dose Route Frequency Provider Last Rate Last Dose  . 0.9 %  sodium chloride infusion  250 mL Intravenous PRN Jennelle Human B, NP      . arformoterol (BROVANA) nebulizer solution 15 mcg  15 mcg Nebulization BID Jennelle Human B, NP      . Derrill Memo ON 03/22/2018] aspirin EC tablet 81 mg  81 mg Oral Daily Jennelle Human B, NP      . atorvastatin (LIPITOR) tablet 40 mg  40 mg Oral Daily Jennelle Human B, NP   40 mg at 03/21/18 1818  . budesonide (PULMICORT) nebulizer solution 0.5 mg  0.5 mg Nebulization BID Jennelle Human B, NP      . famotidine (PEPCID) tablet 20 mg  20 mg Oral BID Jennelle Human B, NP      . Derrill Memo ON 4/69/6295] folic acid (FOLVITE) tablet 0.5 mg  500 mcg Oral Daily Jennelle Human B, NP      . furosemide (LASIX) tablet 20 mg  20 mg Oral BID Jennelle Human B, NP   20 mg at 03/21/18 1818  . heparin injection 5,000 Units  5,000 Units Subcutaneous Q8H Jennelle Human B, NP      . hydrALAZINE (APRESOLINE) tablet 10 mg  10 mg Oral BID Arnell Asal, NP      .  insulin aspart (novoLOG) injection 0-9 Units  0-9 Units Subcutaneous Q4H Jennelle Human B, NP      . ipratropium-albuterol (DUONEB) 0.5-2.5 (3) MG/3ML nebulizer solution 3 mL  3 mL Nebulization Q4H PRN Arnell Asal, NP      . lactated ringers infusion   Intravenous Continuous Roderic Palau, MD 50 mL/hr at 03/21/18 1659       REVIEW OF SYSTEMS:  A 15 point review of systems is documented in the electronic medical record. This was obtained by the nursing staff. However, I reviewed this with the patient to discuss relevant findings and make appropriate changes.  Pertinent items  are noted in HPI.    PHYSICAL EXAM:  height is 5\' 10"  (1.778 m) and weight is 203 lb 14.8 oz (92.5 kg). His temperature is 97.7 F (36.5 C). His blood pressure is 118/90 and his pulse is 90. His respiration is 22 (abnormal) and oxygen saturation is 97%.     0 - Asymptomatic (Fully active, able to carry on all predisease activities without restriction)  1 - Symptomatic but completely ambulatory (Restricted in physically strenuous activity but ambulatory and able to carry out work of a light or sedentary nature. For example, light housework, office work)  2 - Symptomatic, <50% in bed during the day (Ambulatory and capable of all self care but unable to carry out any work activities. Up and about more than 50% of waking hours)  3 - Symptomatic, >50% in bed, but not bedbound (Capable of only limited self-care, confined to bed or chair 50% or more of waking hours)  4 - Bedbound (Completely disabled. Cannot carry on any self-care. Totally confined to bed or chair)  5 - Death   Eustace Pen MM, Creech RH, Tormey DC, et al. [phone]). "Toxicity and response criteria of the [facility]". [place] Oncol. 5 (6): 649-55     LABORATORY DATA:  Lab Results  Component Value Date   WBC 14.0 (H) 03/21/2018   HGB 11.5 (L) 03/21/2018   HCT 37.7 (L) 03/21/2018   MCV 90.6 03/21/2018   PLT 209 03/21/2018   Lab Results  Component Value Date   NA 138 03/21/2018   K 4.4 03/21/2018   CL 101 03/21/2018   CO2 25 03/21/2018   Lab Results  Component Value Date   ALT 8 (L) 03/21/2018   AST 15 03/21/2018   ALKPHOS 53 03/21/2018   BILITOT 0.9 03/21/2018      RADIOGRAPHY: Nm Pet Image Initial (pi) Skull Base To Thigh  Result Date: 03/10/2018 CLINICAL DATA:  Initial treatment strategy for pulmonary mass. EXAM: NUCLEAR MEDICINE PET SKULL BASE TO THIGH TECHNIQUE: 10.0 mCi F-18 FDG was injected intravenously. Full-ring PET imaging was performed from the skull base to thigh after the  radiotracer. CT data was obtained and used for attenuation correction and anatomic localization. Fasting blood glucose: 104 mg/dl COMPARISON:  No recent chest CT available. FINDINGS: Mediastinal blood pool activity: SUV max 2.29 NECK: No hypermetabolic lymph nodes in the neck. Incidental CT findings: none CHEST: Large central RIGHT hilar mass is intensely hypermetabolic with SUV max equal 15.3. Mass is difficult to define on the noncontrast CT but measures approximately 5.6 x 4.6 on the PET imaging data set. Mass obliterates the RIGHT upper lobe bronchus and surrounds the bronchus intermedius. Postobstructive collapse of the RIGHT upper lobe. There are no hypermetabolic mediastinal nodes. Nodes supraclavicular hypermetabolic nodes. Incidental CT findings: none ABDOMEN/PELVIS: No abnormal hypermetabolic activity within the liver, pancreas, adrenal  glands, or spleen. No hypermetabolic lymph nodes in the abdomen or pelvis. Incidental CT findings: Atherosclerotic calcification aorta. Mild dilatation at 3.5 cm of the infrarenal abdominal aorta. Midline ventral hernia contains nonobstructed loop of small bowel. Small LEFT bladder diverticulum. SKELETON: No focal hypermetabolic activity to suggest skeletal metastasis. Incidental CT findings: none IMPRESSION: 1. Large intensely hypermetabolic RIGHT hilar mass obstructs the RIGHT upper lobe bronchus and surrounds the bronchus intermedius. 2. Post obstructive collapse of the RIGHT upper lobe. 3. No hypermetabolic mediastinal or supraclavicular lymph nodes. 4. No evidence distant metastatic disease. Electronically Signed   By: Suzy Bouchard M.D.   On: 03/10/2018 14:04   Dg Chest Portable 1 View  Result Date: 03/21/2018 CLINICAL DATA:  Status post bronchoscopy. EXAM: PORTABLE CHEST 1 VIEW COMPARISON:  PET scan of March 10, 2018.  Radiograph January 12, 2016. FINDINGS: Stable cardiac size. Left lung is clear. No pneumothorax is noted. Large right upper lobe airspace opacity  is noted concerning for postobstructive atelectasis as noted on prior PET scan with underlying mass. Bony thorax is unremarkable. IMPRESSION: Large right upper lobe airspace opacity is noted consistent with postobstructive atelectasis secondary to central obstructive mass as noted on prior PET scan. No pneumothorax is noted status post bronchoscopy. Electronically Signed   By: Marijo Conception, M.D.   On: 03/21/2018 15:12   Dg C-arm Bronchoscopy  Result Date: 03/21/2018 C-ARM BRONCHOSCOPY: Fluoroscopy was utilized by the requesting physician.  No radiographic interpretation.       IMPRESSION:  The patient likely has lung cancer with an obstructive right hilar mass seen on imaging and also today findings were consistent with this on bronchoscopy.  Pathology is pending and hopefully we will have at least a preliminary read on Monday.  Notably, no evidence of additional regional or distant disease was seen on PET scan.  The patient likely is a candidate for a definitive course of radiation treatment with chemotherapy.  In the short-term, radiation treatment to help open up the right lung appears to be a good option, ideally after we confirm malignancy pathologically.   PLAN: I will tentatively plan for the patient to undergo simulation/treatment planning on Monday and we will aim to begin the first fraction of radiation treatment later on Monday as well.  Again, hopefully we can get some initial information from pathology Monday afternoon.  Ideally, the patient can be transferred to [facility] long over the weekend but this will depend on him remaining clinically stable.  If the patient requires intubation, then we will proceed as feasible depending on the circumstances.    ________________________________   Jodelle Gross, MD, PhD   **Disclaimer: This note was dictated with voice recognition software. Similar sounding words can inadvertently be transcribed and this note may contain transcription errors  which may not have been corrected upon publication of note.**

## 2018-03-21 NOTE — Op Note (Addendum)
Video Bronchoscopy with Endobronchial Ultrasound Procedure Note  Date of Operation: 07/11/2016  Pre-op Diagnosis: Right hilar mass with right upper lobe collapse.   Post-op Diagnosis: Same  Surgeon: Marshell Garfinkel  Assistants:   Anesthesia: General endotracheal anesthesia  Operation: Flexible video fiberoptic bronchoscopy with endobronchial ultrasound and biopsies.  Estimated Blood Loss: Minimal  Complications: None apparent  Indications and History: Barry Booker is a 72 y.o. male with hypermetabolic hilar mass on CT chest and PET. Recommendation made to achieve tissue sampling via endobronchial ultrasound guided nodal biopsies. The risks, benefits, complications, treatment options and expected outcomes were discussed with the patient.  The possibilities of pneumothorax, pneumonia, reaction to medication, pulmonary aspiration, perforation of a viscus, bleeding, failure to diagnose a condition and creating a complication requiring transfusion or operation were discussed with the patient who freely signed the consent.    Description of Procedure: The patient was examined in the preoperative area and history and data from the preprocedure consultation were reviewed. It was deemed appropriate to proceed.  The patient was taken to OR 10, identified as Barry Booker and the procedure verified as Flexible Video Fiberoptic Bronchoscopy.  A Time Out was held and the above information confirmed. After being taken to the operating room general anesthesia was initiated and the patient  was orally intubated. The video fiberoptic bronchoscope was introduced via the endotracheal tube and a general inspection was performed which showed some nodularity of the right mainstem bronchial mucosa with near complete occlusion of right main stem. Unable to traverse the occluded part of right main stem. The mucosa was friable with propensity to bleed.   Endobronchial biopsies and brushings were taken from the  R mainstem and right upper lobe for pathology. The standard scope was then withdrawn and the endobronchial ultrasound was used to identify and characterize the peritracheal, hilar and bronchial lymph nodes. Inspection showed boderline enlargement of station 7 and vague right hilar mass. Using real-time ultrasound guidance Wang needle biopsies were take from Station 7 and right hilar mass.  At the end of the procedure a general airway inspection was performed and there was no evidence of active bleeding. The bronchoscope was removed.  The patient tolerated the procedure well. There was no significant blood loss and there were no obvious complications. A post-procedural chest x-ray is pending.  The patient tolerated the procedure well without apparent complications. There was no significant blood loss. The bronchoscope was withdrawn. Anesthesia was reversed and the patient was taken to the PACU for recovery.   Right main stem bronchus   Trachea   Samples: 1. Wang needle biopsies from 7 node 2. Wang needle biopsies from right hilar mass 3. Endobronchial biopsies from R mainstem bronchus 4. Endobronchial brushings from R mainstem bronchus. 4. Bronchial washings for cytology.  Plans:  Admit to inpatient post procedure as pt has episode of desats with hypoxia in PACU. Consult Medical Onc and radiation Onc Pt may need XRT to open up the right main stem and right upper lobe of the lung Follow biopsy results  Marshell Garfinkel MD [facility] 03/21/2018, 2:55 PM

## 2018-03-21 NOTE — Progress Notes (Signed)
PULMONARY / CRITICAL CARE MEDICINE   Name: Barry Booker MRN: [id number] DOB: 1946/02/04    ADMISSION DATE:  03/21/2018  Brief Summary: 72 year old with COPD, former smoker (50 pack year/ quit 07/2017) hypertension, hyperlipidemia, diastolic heart admitted 8/11 after outpatient EBUS.  Previous CXR at primary care showed right upper lobe collapse.  A follow-up CT showed mediastinal mass with right upper lobe bronchial narrowing and collapse, thus referred to pulmonary for further evaluation.  Complaints of cough, mucus production, dyspnea with activity and at rest.  Has had ongoing weakness, loss of weight and appetite for the past few months Denies any hemoptysis, fevers, chills.  Found on bronchoscopy to have right mainstem obstructive mass with initital pathology with atypical cells with additional RUL collapse.    SUBJECTIVE:  On Face Mask at 6L  Increased WOB and SOB in PACU, placing on BiPAP  VITAL SIGNS: BP (!) 145/87 (BP Location: Right Arm)   Pulse 93   Temp (!) 97 F (36.1 C)   Resp (!) 23   Ht 5\' 10"  (1.778 m)   Wt 208 lb (94.3 kg)   SpO2 91%   BMI 29.84 kg/m   HEMODYNAMICS:    VENTILATOR SETTINGS:    INTAKE / OUTPUT: No intake/output data recorded.  PHYSICAL EXAMINATION: General:  Ill appearing elderly male in distress, gray HEENT: MM pink/dry, pupils =/reactive Neuro: Alert, agitated, oriented, MAE CV: rrr, no m/r/g PULM: Tripod position, very diminished air movement R, clear left GI: obese, soft, bs active  Extremities: warm/dry, no edema  Skin: no rashes   LABS:  BMET Recent Labs  Lab 03/19/18 0841  NA 141  K 4.0  CL 102  CO2 27  BUN 8  CREATININE 0.92  GLUCOSE 113*    Electrolytes Recent Labs  Lab 03/19/18 0841  CALCIUM 9.5    CBC Recent Labs  Lab 03/19/18 0841  WBC 12.2*  HGB 11.9*  HCT 38.8*  PLT 259    Coag's Recent Labs  Lab 03/19/18 0841  APTT 36  INR 1.13    Sepsis Markers No results for input(s):  LATICACIDVEN, PROCALCITON, O2SATVEN in the last 168 hours.  ABG No results for input(s): PHART, PCO2ART, PO2ART in the last 168 hours.  Liver Enzymes Recent Labs  Lab 03/19/18 0841  AST 13*  ALT 8*  ALKPHOS 51  BILITOT 0.3  ALBUMIN 3.0*    Cardiac Enzymes No results for input(s): TROPONINI, PROBNP in the last 168 hours.  Glucose Recent Labs  Lab 03/19/18 0807 03/21/18 1104 03/21/18 1500  GLUCAP 131* 107* 144*    Imaging Dg Chest Portable 1 View  Result Date: 03/21/2018 CLINICAL DATA:  Status post bronchoscopy. EXAM: PORTABLE CHEST 1 VIEW COMPARISON:  PET scan of March 10, 2018.  Radiograph January 12, 2016. FINDINGS: Stable cardiac size. Left lung is clear. No pneumothorax is noted. Large right upper lobe airspace opacity is noted concerning for postobstructive atelectasis as noted on prior PET scan with underlying mass. Bony thorax is unremarkable. IMPRESSION: Large right upper lobe airspace opacity is noted consistent with postobstructive atelectasis secondary to central obstructive mass as noted on prior PET scan. No pneumothorax is noted status post bronchoscopy. Electronically Signed   By: Marijo Conception, M.D.   On: 03/21/2018 15:12   Dg C-arm Bronchoscopy  Result Date: 03/21/2018 C-ARM BRONCHOSCOPY: Fluoroscopy was utilized by the requesting physician.  No radiographic interpretation.   STUDIES:  CT chest 02/27/2018 5.8 x 4.9 cm mass in the right mediastinum and hilum with  associated severe narrowing of the right main pulmonary artery as well as right upper lobe and right middle lobe arteries. Obstructed right main stem bronchus and collapse of the right  upper lobe and right middle lobe. Small right effusion. The appearance is similar to the patient's chest radiograph from 02/20/2018.  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 PET scan reviewed with hyperactivity which is highly suspicious for malignancy; s/p EBUS 6/21 with initial atypical pathology Hx COPD, former smoker - post procedure CXR without ptx P:   High risk for intubation, admit to ICU PRN BiPAP Scheduled duonebs q 4 hrs Brovanna/ pulmicort nebs BID Oncology and Radiation Onc consulted  Follow pathology results from EBUS Consider transfer to [facility] if needed for therapeutic radiation, but for now remains at [facility] as is high intubation risk   CARDIOVASCULAR A:  Hx HTN, HLD, diastolic HF P:  Tele monitoring  Currently normotensive Resume home lipitor, lasix, and apresoline Holding benazepril   RENAL A:   No known acute issues  P:   Assess renal panel/ mag/ phos Monitor UOP/ for retention Replace electrolytes as indicated Avoid nephrotoxic agents, ensure adequate renal perfusion  GASTROINTESTINAL A:   NPO while on BiPAP P:   Pepcid for SUP NPO   HEMATOLOGIC A:   No acute issues P:  CBC now Heparin SQ/ SCDs  INFECTIOUS A:   No acute concerns for now P:   Assess PCT, in case ? Of post-obstructive PNA however, is afebrile/ normal WBC on pre-op labs Trend WBC/ fever curve  ENDOCRINE A:   DM P:   CBG q 4 SSI Hold metformin  NEUROLOGIC A:   No acute issues  P:   Monitor   FAMILY  - Updates: Wife updated by Dr. Vaughan Browner.    - Inter-disciplinary family meet or Palliative Care meeting due by:  6/28   Kennieth Rad, AGACNP-BC [facility] Pgr: [phone] or if no answer [phone] 03/21/2018, 4:09 PM

## 2018-03-22 ENCOUNTER — Inpatient Hospital Stay (HOSPITAL_COMMUNITY): Payer: Medicare HMO

## 2018-03-22 DIAGNOSIS — Z809 Family history of malignant neoplasm, unspecified: Secondary | ICD-10-CM

## 2018-03-22 DIAGNOSIS — F1010 Alcohol abuse, uncomplicated: Secondary | ICD-10-CM

## 2018-03-22 DIAGNOSIS — J449 Chronic obstructive pulmonary disease, unspecified: Secondary | ICD-10-CM

## 2018-03-22 DIAGNOSIS — I10 Essential (primary) hypertension: Secondary | ICD-10-CM

## 2018-03-22 DIAGNOSIS — F5000 Anorexia nervosa, unspecified: Secondary | ICD-10-CM

## 2018-03-22 DIAGNOSIS — R0603 Acute respiratory distress: Secondary | ICD-10-CM

## 2018-03-22 DIAGNOSIS — E785 Hyperlipidemia, unspecified: Secondary | ICD-10-CM

## 2018-03-22 DIAGNOSIS — R634 Abnormal weight loss: Secondary | ICD-10-CM

## 2018-03-22 DIAGNOSIS — Z87891 Personal history of nicotine dependence: Secondary | ICD-10-CM

## 2018-03-22 DIAGNOSIS — R918 Other nonspecific abnormal finding of lung field: Secondary | ICD-10-CM

## 2018-03-22 DIAGNOSIS — J988 Other specified respiratory disorders: Secondary | ICD-10-CM

## 2018-03-22 DIAGNOSIS — Z6828 Body mass index (BMI) 28.0-28.9, adult: Secondary | ICD-10-CM

## 2018-03-22 DIAGNOSIS — E119 Type 2 diabetes mellitus without complications: Secondary | ICD-10-CM

## 2018-03-22 DIAGNOSIS — C3491 Malignant neoplasm of unspecified part of right bronchus or lung: Secondary | ICD-10-CM

## 2018-03-22 LAB — GLUCOSE, CAPILLARY
GLUCOSE-CAPILLARY: 115 mg/dL — AB (ref 65–99)
GLUCOSE-CAPILLARY: 165 mg/dL — AB (ref 65–99)
GLUCOSE-CAPILLARY: 107 mg/dL — AB (ref 65–99)
Glucose-Capillary: 147 mg/dL — ABNORMAL HIGH (ref 65–99)
Glucose-Capillary: 123 mg/dL — ABNORMAL HIGH (ref 65–99)

## 2018-03-22 LAB — CBC
HEMATOCRIT: 33.5 % — AB (ref 39.0–52.0)
Hemoglobin: 10.5 g/dL — ABNORMAL LOW (ref 13.0–17.0)
MCH: 28 pg (ref 26.0–34.0)
MCHC: 31.3 g/dL (ref 30.0–36.0)
MCV: 89.3 fL (ref 78.0–100.0)
Platelets: 237 10*3/uL (ref 150–400)
RBC: 3.75 MIL/uL — ABNORMAL LOW (ref 4.22–5.81)
RDW: 13.6 % (ref 11.5–15.5)
WBC: 11.1 10*3/uL — ABNORMAL HIGH (ref 4.0–10.5)

## 2018-03-22 LAB — HIV ANTIBODY (ROUTINE TESTING W REFLEX): HIV SCREEN 4TH GENERATION: NONREACTIVE

## 2018-03-22 MED ORDER — SODIUM CHLORIDE 0.9% FLUSH: 3.0000 mL | INTRAVENOUS | Status: DC | PRN

## 2018-03-22 MED ORDER — UMECLIDINIUM-VILANTEROL 62.5-25 MCG/INH IN AEPB
1.0000 | INHALATION_SPRAY | Freq: Every day | RESPIRATORY_TRACT | Status: DC
  Administered 2018-03-23: 1 via RESPIRATORY_TRACT
  Filled 2018-03-22: qty 14

## 2018-03-22 MED ORDER — BENAZEPRIL HCL 10 MG PO TABS
10.0000 mg | ORAL_TABLET | Freq: Every day | ORAL | Status: DC
  Administered 2018-03-22 – 2018-03-24 (×3): 10 mg via ORAL
  Filled 2018-03-22 (×3): qty 1

## 2018-03-22 MED ORDER — INSULIN ASPART 100 UNIT/ML ~~LOC~~ SOLN
0.0000 [IU] | Freq: Three times a day (TID) | SUBCUTANEOUS | Status: DC
  Administered 2018-03-22: 3 [IU] via SUBCUTANEOUS
  Administered 2018-03-22: 2 [IU] via SUBCUTANEOUS
  Administered 2018-03-23 – 2018-03-24 (×3): 3 [IU] via SUBCUTANEOUS

## 2018-03-22 MED ORDER — SODIUM CHLORIDE 0.9% FLUSH
3.0000 mL | Freq: Two times a day (BID) | INTRAVENOUS | Status: DC
  Administered 2018-03-22 – 2018-03-23 (×3): 3 mL via INTRAVENOUS

## 2018-03-22 NOTE — Progress Notes (Signed)
[name]    HEMATOLOGY/ONCOLOGY CONSULTATION NOTE  Date of Service: 03/22/2018  Patient Care Team: Bernerd Limbo, MD as PCP - General (Family Medicine)  CHIEF COMPLAINTS/PURPOSE OF CONSULTATION:  Concern for newly diagnosed lung cancer  HISTORY OF PRESENTING ILLNESS:   Barry Booker is a wonderful 72 y.o. male who has been referred to [place] by Dr Marshell Garfinkel for evaluation and management of presumed newly diagnosed rt sided lung cancer with concern for bronchial obstruction.  Patient has a history of hypertension, dyslipidemia, diabetes, COPD, alcohol abuse and previously had a chest x-[facility] on 02/20/2018 at [facility] which showed Right upper lobe consolidation/atelectasis may relate to post obstructive pneumonitis/pneumonia. An underlying obstructing lesion should be considered. Consider CT for further assessment.  H subsequently then had a CT of the chest on 02/27/2018 which showed -5.8 x 4.9 cm mass in the right mediastinum and hilum with associated severe narrowing of the right main pulmonary artery as well as right upper lobe and right middle lobe arteries. Obstructed right main stem bronchus and collapse of the right upper lobe and right middle lobe. Small right effusion. The appearance is similar to the patient's chest radiograph from 02/20/2018.  Patient subsequently had a PET CT on 03/10/2018 by Dr. Vaughan Browner which showed Large intensely hypermetabolic RIGHT hilar mass obstructs the RIGHT upper lobe bronchus and surrounds the bronchus intermedius. Post obstructive collapse of the RIGHT upper lobe.  No hypermetabolic mediastinal or supraclavicular lymph nodes. No evidence distant metastatic disease.  Patient had a flexible video fiberoptic bronchoscopy done on 03/21/2018 by Dr. Vaughan Browner which showed which showed some nodularity of the right mainstem bronchial mucosa with near complete occlusion of right main stem. Unable to traverse the occluded part of right main stem. The mucosa was friable with  propensity to bleed. Endobronchial biopsies and brushings were taken from the R mainstem and right upper lobe for pathology.  Preliminary results showed atypical cells however final cytology/pathology is currently pending.  Patient report rt sided chest pain with deep inspiration, anorexia and weight loss of about 30lbs in the last 6 months.  MEDICAL HISTORY:  Past Medical History:  Diagnosis Date  . Alcohol abuse   . COPD (chronic obstructive pulmonary disease) ([place])   . Diabetes mellitus without complication ([place])   . Gout   . High cholesterol   . Hypertension     SURGICAL HISTORY: Past Surgical History:  Procedure Laterality Date  . COLONOSCOPY    . NOSE SURGERY  90's    SOCIAL HISTORY: Social History   Socioeconomic History  . Marital status: Married    Spouse name: Not on file  . Number of children: Not on file  . Years of education: Not on file  . Highest education level: Not on file  Occupational History  . Not on file  Social Needs  . Financial resource strain: Not on file  . Food insecurity:    Worry: Not on file    Inability: Not on file  . Transportation needs:    Medical: Not on file    Non-medical: Not on file  Tobacco Use  . Smoking status: Former Smoker    Packs/day: 0.25    Years: 50.00    Pack years: 12.50    Types: Cigarettes    Last attempt to quit: 07/2017    Years since quitting: 0.7  . Smokeless tobacco: Never Used  . Tobacco comment: quit smoking 07/2017  Substance and Sexual Activity  . Alcohol use: Yes    Alcohol/week:  8.4 - 12.6 oz    Types: 14 - 21 Cans of beer per week  . Drug use: No  . Sexual activity: Not on file  Lifestyle  . Physical activity:    Days per week: Not on file    Minutes per session: Not on file  . Stress: Not on file  Relationships  . Social connections:    Talks on phone: Not on file    Gets together: Not on file    Attends religious service: Not on file    Active member of club or organization: Not  on file    Attends meetings of clubs or organizations: Not on file    Relationship status: Not on file  . Intimate partner violence:    Fear of current or ex partner: Not on file    Emotionally abused: Not on file    Physically abused: Not on file    Forced sexual activity: Not on file  Other Topics Concern  . Not on file  Social History Narrative  . Not on file    FAMILY HISTORY: Family History  Problem Relation Age of Onset  . Arthritis Mother   . Cancer Father   . Cancer Sister   . Heart disease Maternal Uncle   . Colon cancer Neg Hx     ALLERGIES:  is allergic to neomycin-bacitracin zn-polymyx.  MEDICATIONS:  Current Facility-Administered Medications  Medication Dose Route Frequency Provider Last Rate Last Dose  . 0.9 %  sodium chloride infusion  250 mL Intravenous PRN Collene Gobble, MD   Stopped at 03/22/18 0600  . aspirin EC tablet 81 mg  81 mg Oral Daily Collene Gobble, MD   81 mg at 03/22/18 1140  . atorvastatin (LIPITOR) tablet 40 mg  40 mg Oral Daily Collene Gobble, MD   40 mg at 03/21/18 1818  . benazepril (LOTENSIN) tablet 10 mg  10 mg Oral Daily Collene Gobble, MD   10 mg at 03/22/18 1140  . folic acid (FOLVITE) tablet 0.5 mg  500 mcg Oral Daily Collene Gobble, MD   0.5 mg at 03/22/18 1139  . furosemide (LASIX) tablet 20 mg  20 mg Oral BID Collene Gobble, MD   20 mg at 03/22/18 1138  . heparin injection 5,000 Units  5,000 Units Subcutaneous Q8H Collene Gobble, MD   5,000 Units at 03/22/18 1326  . hydrALAZINE (APRESOLINE) tablet 10 mg  10 mg Oral BID Collene Gobble, MD   10 mg at 03/22/18 1138  . insulin aspart (novoLOG) injection 0-15 Units  0-15 Units Subcutaneous TID WC Collene Gobble, MD   3 Units at 03/22/18 1322  . ipratropium-albuterol (DUONEB) 0.5-2.5 (3) MG/3ML nebulizer solution 3 mL  3 mL Nebulization Q4H PRN Byrum, Rose Fillers, MD      . sodium chloride flush (NS) 0.9 % injection 3 mL  3 mL Intravenous Q12H Collene Gobble, MD   3 mL at 03/22/18  1141  . sodium chloride flush (NS) 0.9 % injection 3 mL  3 mL Intravenous PRN Byrum, Rose Fillers, MD      . umeclidinium-vilanterol (ANORO ELLIPTA) 62.5-25 MCG/INH 1 puff  1 puff Inhalation Daily Byrum, Rose Fillers, MD        REVIEW OF SYSTEMS:    10 Point review of Systems was done is negative except as noted above.  PHYSICAL EXAMINATION: ECOG PERFORMANCE STATUS: 1 - Symptomatic but completely ambulatory  . Vitals:   03/22/18 1200  03/22/18 1237  BP:  114/77  Pulse:  85  Resp:  (!) 24  Temp: 98 F (36.7 C) 97.7 F (36.5 C)  SpO2:  98%   Filed Weights   03/21/18 1645 03/22/18 0500 03/22/18 1237  Weight: 203 lb 14.8 oz (92.5 kg) 201 lb 1 oz (91.2 kg) 200 lb (90.7 kg)   .Body mass index is 28.7 kg/m.  GENERAL:alert, in no acute distress and comfortable SKIN: no acute rashes, no significant lesions EYES: conjunctiva are pink and non-injected, sclera anicteric OROPHARYNX: MMM, no exudates, no oropharyngeal erythema or ulceration NECK: supple, no JVD LYMPH:  no palpable lymphadenopathy in the cervical, axillary or inguinal regions LUNGS: decreased air entry Rt upper and mid zones HEART: regular rate & rhythm ABDOMEN:  normoactive bowel sounds , non tender, not distended. Extremity: no pedal edema PSYCH: alert & oriented x 3 with fluent speech NEURO: no focal motor/sensory deficits  LABORATORY DATA:  I have reviewed the data as listed  . CBC Latest Ref Rng & Units 03/22/2018 03/21/2018 03/19/2018  WBC 4.0 - 10.5 K/uL 11.1(H) 14.0(H) 12.2(H)  Hemoglobin 13.0 - 17.0 g/dL 10.5(L) 11.5(L) 11.9(L)  Hematocrit 39.0 - 52.0 % 33.5(L) 37.7(L) 38.8(L)  Platelets 150 - 400 K/uL 237 209 259    . CMP Latest Ref Rng & Units 03/21/2018 03/21/2018 03/19/2018  Glucose 65 - 99 mg/dL 193(H) 190(H) 113(H)  BUN 6 - 20 mg/dL 12 11 8   Creatinine 0.61 - 1.24 mg/dL 0.96 1.00 0.92  Sodium 135 - 145 mmol/L 138 138 141  Potassium 3.5 - 5.1 mmol/L 4.0 4.4 4.0  Chloride 101 - 111 mmol/L 102 101 102  CO2  22 - 32 mmol/L 26 25 27   Calcium 8.9 - 10.3 mg/dL 9.0 8.9 9.5  Total Protein 6.5 - 8.1 g/dL - 7.8 7.8  Total Bilirubin 0.3 - 1.2 mg/dL - 0.9 0.3  Alkaline Phos 38 - 126 U/L - 53 51  AST 15 - 41 U/L - 15 13(L)  ALT 17 - 63 U/L - 8(L) 8(L)    RADIOGRAPHIC STUDIES: I have personally reviewed the radiological images as listed and agreed with the findings in the report. Nm Pet Image Initial (pi) Skull Base To Thigh  Result Date: 03/10/2018 CLINICAL DATA:  Initial treatment strategy for pulmonary mass. EXAM: NUCLEAR MEDICINE PET SKULL BASE TO THIGH TECHNIQUE: 10.0 mCi F-18 FDG was injected intravenously. Full-ring PET imaging was performed from the skull base to thigh after the radiotracer. CT data was obtained and used for attenuation correction and anatomic localization. Fasting blood glucose: 104 mg/dl COMPARISON:  No recent chest CT available. FINDINGS: Mediastinal blood pool activity: SUV max 2.29 NECK: No hypermetabolic lymph nodes in the neck. Incidental CT findings: none CHEST: Large central RIGHT hilar mass is intensely hypermetabolic with SUV max equal 15.3. Mass is difficult to define on the noncontrast CT but measures approximately 5.6 x 4.6 on the PET imaging data set. Mass obliterates the RIGHT upper lobe bronchus and surrounds the bronchus intermedius. Postobstructive collapse of the RIGHT upper lobe. There are no hypermetabolic mediastinal nodes. Nodes supraclavicular hypermetabolic nodes. Incidental CT findings: none ABDOMEN/PELVIS: No abnormal hypermetabolic activity within the liver, pancreas, adrenal glands, or spleen. No hypermetabolic lymph nodes in the abdomen or pelvis. Incidental CT findings: Atherosclerotic calcification aorta. Mild dilatation at 3.5 cm of the infrarenal abdominal aorta. Midline ventral hernia contains nonobstructed loop of small bowel. Small LEFT bladder diverticulum. SKELETON: No focal hypermetabolic activity to suggest skeletal metastasis. Incidental CT findings:  none  IMPRESSION: 1. Large intensely hypermetabolic RIGHT hilar mass obstructs the RIGHT upper lobe bronchus and surrounds the bronchus intermedius. 2. Post obstructive collapse of the RIGHT upper lobe. 3. No hypermetabolic mediastinal or supraclavicular lymph nodes. 4. No evidence distant metastatic disease. Electronically Signed   By: Suzy Bouchard M.D.   On: 03/10/2018 14:04   Dg Chest Port 1 View  Result Date: 03/22/2018 CLINICAL DATA:  Acute respiratory failure. EXAM: PORTABLE CHEST 1 VIEW COMPARISON:  03/21/2018 FINDINGS: The cardiomediastinal silhouette is unchanged. There is persistent cut off of the right mainstem bronchus by the known obstructing right hilar mass demonstrated on prior PET-CT. Right hemithorax volume loss is again noted with rightward mediastinal shift and dense right upper lung opacity corresponding to upper lobe collapse and consolidation. The right lower lung and left lung remain clear. No sizable pleural effusion or pneumothorax is identified. IMPRESSION: Unchanged postobstructive right upper lobe collapse. Electronically Signed   By: Logan Bores M.D.   On: 03/22/2018 08:31   Dg Chest Portable 1 View  Result Date: 03/21/2018 CLINICAL DATA:  Status post bronchoscopy. EXAM: PORTABLE CHEST 1 VIEW COMPARISON:  PET scan of March 10, 2018.  Radiograph January 12, 2016. FINDINGS: Stable cardiac size. Left lung is clear. No pneumothorax is noted. Large right upper lobe airspace opacity is noted concerning for postobstructive atelectasis as noted on prior PET scan with underlying mass. Bony thorax is unremarkable. IMPRESSION: Large right upper lobe airspace opacity is noted consistent with postobstructive atelectasis secondary to central obstructive mass as noted on prior PET scan. No pneumothorax is noted status post bronchoscopy. Electronically Signed   By: Marijo Conception, M.D.   On: 03/21/2018 15:12   Dg C-arm Bronchoscopy  Result Date: 03/21/2018 C-ARM BRONCHOSCOPY: Fluoroscopy  was utilized by the requesting physician.  No radiographic interpretation.    ASSESSMENT & PLAN:   72 yo male with hypertension, diabetes, dyslipidemia, COPD, alcohol abuse with  1) Presumed newly diagnosed right hilar primary lung malignancy   PET showed  Large intensely hypermetabolic RIGHT hilar mass obstructs the RIGHT upper lobe bronchus and surrounds the bronchus intermedius. 2. Post obstructive collapse of the RIGHT upper lobe. No overt distant mets on PET/CT 2) RUL airway obstruction with RUL lung collapse. 3) Concern for severe narrowing of the right main pulmonary artery as well as right upper lobe and right middle lobe arteries. Due to lung. PLAN -patient is s/p FOB on 03/21/2018 with findings of RUL airway obstruction. -Cytology/pathology pending - will likely be available on Monday -likely surgically inoperable disease ?Stage III based on imaging at this time. -would recommend MRI Brain to complete clinical staging of the patients lung cancer.(hope to have done prior to discharge to expedite w/u) -already seen by radiation oncology- Dr Lisbeth Renshaw -- plan to consider starting RT to help with airway obstruction assuming pathology confirmed primary lung cancer. -if alternative pathology is noted such as lymphoma. -assuming this turns out to be primary lung cancer (limited stage small cell or Stage III NSCLC) there would be a possible role of adding chemotherapy to definitive RT concurrently. -will awaiting pathology results. -if patient is stable and gets discharged over the weekend - will setup for f/u in clinic next week.  Appreciate this interesting consultation.  All of the patients questions were answered with apparent satisfaction. The patient knows to call the clinic with any problems, questions or concerns.  I spent 60 minutes counseling the patient face to face. The total time spent in the appointment was 53  minutes and more than 50% was on counseling and direct patient  cares.    Sullivan Lone MD [place] AAHIVMS [facility] Hematology/Oncology Physician [facility]  (Office):       [phone] (Work cell):  [phone] (Fax):           [phone]  03/22/2018 1:59 PM

## 2018-03-22 NOTE — Progress Notes (Signed)
Received from [facility] accompanied by wife and RN. Doe not appear to be in acute distress.

## 2018-03-22 NOTE — Progress Notes (Signed)
PULMONARY / CRITICAL CARE MEDICINE   Name: Barry Booker MRN: [id number] DOB: 08-Dec-1945    ADMISSION DATE:  03/21/2018  Brief Summary: 72 year old with COPD, former smoker (50 pack year/ quit 07/2017) hypertension, hyperlipidemia, diastolic heart admitted 9/44 after outpatient EBUS.  Previous CXR at primary care showed right upper lobe collapse.  A follow-up CT showed mediastinal mass with right upper lobe bronchial narrowing and collapse, thus referred to pulmonary for further evaluation.  Complaints of cough, mucus production, dyspnea with activity and at rest.  Has had ongoing weakness, loss of weight and appetite for the past few months Denies any hemoptysis, fevers, chills.  Found on bronchoscopy to have right mainstem obstructive mass with initital pathology with atypical cells with additional RUL collapse.    SUBJECTIVE:  Required BiPAP briefly last night. Now remarkably improved, feels at his baseline, saturating well on room air Hungry, wants breakfast  VITAL SIGNS: BP 118/81   Pulse 65   Temp 98.4 F (36.9 C) (Oral)   Resp (!) 32   Ht 5\' 10"  (1.778 m)   Wt 91.2 kg (201 lb 1 oz)   SpO2 95%   BMI 28.85 kg/m   HEMODYNAMICS:    VENTILATOR SETTINGS:    INTAKE / OUTPUT: I/O last 3 completed shifts: In: 1419.6 [I.V.:1419.6] Out: 515 [Urine:500; Blood:15]  PHYSICAL EXAMINATION: General: Elderly gentleman, comfortable in bed HEENT: Oropharynx moist, pupils equal Neuro: Awake, alert, comfortable, follows commands, interacts appropriately CV: Regular, no murmur PULM: Comfortable respiratory pattern although he states it is difficult to take a deep breath, no breath sounds in the right upper zone, some on the right base, the left is clear GI: Soft, nontender, positive bowel sounds Extremities: No edema Skin: No rash  LABS:  BMET Recent Labs  Lab 03/19/18 0841 03/21/18 1647 03/21/18 1845  NA 141 138 138  K 4.0 4.4 4.0  CL 102 101 102  CO2 27 25 26   BUN  8 11 12   CREATININE 0.92 1.00 0.96  GLUCOSE 113* 190* 193*    Electrolytes Recent Labs  Lab 03/19/18 0841 03/21/18 1647 03/21/18 1845  CALCIUM 9.5 8.9 9.0  MG  --  1.9  --   PHOS  --  4.0  --     CBC Recent Labs  Lab 03/19/18 0841 03/21/18 1647 03/22/18 0234  WBC 12.2* 14.0* 11.1*  HGB 11.9* 11.5* 10.5*  HCT 38.8* 37.7* 33.5*  PLT 259 209 237    Coag's Recent Labs  Lab 03/19/18 0841 03/21/18 1647  APTT 36  --   INR 1.13 1.13    Sepsis Markers Recent Labs  Lab 03/21/18 1647  PROCALCITON <0.10    ABG No results for input(s): PHART, PCO2ART, PO2ART in the last 168 hours.  Liver Enzymes Recent Labs  Lab 03/19/18 0841 03/21/18 1647  AST 13* 15  ALT 8* 8*  ALKPHOS 51 53  BILITOT 0.3 0.9  ALBUMIN 3.0* 3.0*    Cardiac Enzymes No results for input(s): TROPONINI, PROBNP in the last 168 hours.  Glucose Recent Labs  Lab 03/21/18 1104 03/21/18 1500 03/21/18 2044 03/21/18 2341 03/22/18 0423 03/22/18 0749  GLUCAP 107* 144* 169* 168* 147* 115*    Imaging Dg Chest Port 1 View  Result Date: 03/22/2018 CLINICAL DATA:  Acute respiratory failure. EXAM: PORTABLE CHEST 1 VIEW COMPARISON:  03/21/2018 FINDINGS: The cardiomediastinal silhouette is unchanged. There is persistent cut off of the right mainstem bronchus by the known obstructing right hilar mass demonstrated on prior PET-CT. Right hemithorax volume  loss is again noted with rightward mediastinal shift and dense right upper lung opacity corresponding to upper lobe collapse and consolidation. The right lower lung and left lung remain clear. No sizable pleural effusion or pneumothorax is identified. IMPRESSION: Unchanged postobstructive right upper lobe collapse. Electronically Signed   By: Logan Bores M.D.   On: 03/22/2018 08:31   Dg Chest Portable 1 View  Result Date: 03/21/2018 CLINICAL DATA:  Status post bronchoscopy. EXAM: PORTABLE CHEST 1 VIEW COMPARISON:  PET scan of March 10, 2018.  Radiograph  January 12, 2016. FINDINGS: Stable cardiac size. Left lung is clear. No pneumothorax is noted. Large right upper lobe airspace opacity is noted concerning for postobstructive atelectasis as noted on prior PET scan with underlying mass. Bony thorax is unremarkable. IMPRESSION: Large right upper lobe airspace opacity is noted consistent with postobstructive atelectasis secondary to central obstructive mass as noted on prior PET scan. No pneumothorax is noted status post bronchoscopy. Electronically Signed   By: Marijo Conception, M.D.   On: 03/21/2018 15:12   Dg C-arm Bronchoscopy  Result Date: 03/21/2018 C-ARM BRONCHOSCOPY: Fluoroscopy was utilized by the requesting physician.  No radiographic interpretation.   STUDIES:  CT chest 02/27/2018 5.8 x 4.9 cm mass in the right mediastinum and hilum with associated severe narrowing of the right main pulmonary artery as well as right upper lobe and right middle lobe arteries. Obstructed right main stem bronchus and collapse of the right  upper lobe and right middle lobe. Small right effusion. The appearance is similar to the patient's chest radiograph from 02/20/2018.  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  No longer risk for intubation, doubt he will need any more BiPAP Continue scheduled bronchodilators, change back to his home Anoro Await pathology results from his bronchoscopy, should help US guide therapy. Appreciate radiation oncology evaluation.  He will need simulation and treatment to address to his right upper lobe and partial right  mainstem occlusion.  He would likely need to be transferred to [facility] in order to facilitate.  We will either plan to do this on 6/23 or send him home to have it done as an outpatient depending on his clinical status and his wishes.  CARDIOVASCULAR A:  Hx HTN, HLD, diastolic HF P:  Continue Lipitor, furosemide, hydralazine Restart home benazepril 6/22  RENAL A:   No known acute issues  P:   Follow urine output, BMP Replace electrolytes as indicated  GASTROINTESTINAL A:   Nutrition P:   Discontinue Pepcid Okay to start a heart healthy diet  HEMATOLOGIC A:   No acute issues P:  Follow CBC Heparin for DVT prophylaxis  INFECTIOUS A:   No acute concerns for now P:   Follow clinically.  Procalcitonin reassuring Follow WBC  ENDOCRINE A:   DM P:   Holding metformin for now, plan to restart when he is taking good p.o. diet Sliding scale insulin as ordered, change to ac + hs  NEUROLOGIC A:   No acute issues  P:   Monitor   FAMILY  - Updates: Wife updated by Dr. Vaughan Browner on 6/21. Pt updated  by Dr Lamonte Sakai 6/22  - Inter-disciplinary family meet or Palliative Care meeting due by:  6/28  Baltazar Apo, MD, PhD 03/22/2018, 9:56 AM [place] [facility] [phone] or if no answer [phone]

## 2018-03-23 ENCOUNTER — Inpatient Hospital Stay (HOSPITAL_COMMUNITY): Payer: Medicare HMO

## 2018-03-23 LAB — GLUCOSE, CAPILLARY
GLUCOSE-CAPILLARY: 97 mg/dL (ref 65–99)
GLUCOSE-CAPILLARY: 169 mg/dL — AB (ref 65–99)
Glucose-Capillary: 109 mg/dL — ABNORMAL HIGH (ref 65–99)
Glucose-Capillary: 128 mg/dL — ABNORMAL HIGH (ref 65–99)

## 2018-03-23 MED ORDER — GADOBENATE DIMEGLUMINE 529 MG/ML IV SOLN
20.0000 mL | Freq: Once | INTRAVENOUS | Status: AC
  Administered 2018-03-23: 20 mL via INTRAVENOUS

## 2018-03-23 NOTE — Progress Notes (Signed)
PULMONARY / CRITICAL CARE MEDICINE   Name: DAMIR LEUNG MRN: [id number] DOB: 08/06/1946    ADMISSION DATE:  03/21/2018  Brief Summary: 72 year old with COPD, former smoker (50 pack year/ quit 07/2017) hypertension, hyperlipidemia, diastolic heart admitted 7/82 after outpatient EBUS.  Previous CXR at primary care showed right upper lobe collapse.  A follow-up CT showed mediastinal mass with right upper lobe bronchial narrowing and collapse, thus referred to pulmonary for further evaluation.  Complaints of cough, mucus production, dyspnea with activity and at rest.  Has had ongoing weakness, loss of weight and appetite for the past few months Denies any hemoptysis, fevers, chills.  Found on bronchoscopy to have right mainstem obstructive mass with initital pathology with atypical cells with additional RUL collapse.    SUBJECTIVE:  No distress multiple family members present Patient wonders when he can go home  VITAL SIGNS: BP 104/71   Pulse 79   Temp 98.4 F (36.9 C) (Oral)   Resp 18   Ht 5\' 10"  (1.778 m)   Wt 92.3 kg (203 lb 7.8 oz)   SpO2 95%   BMI 29.20 kg/m   HEMODYNAMICS:    VENTILATOR SETTINGS:    INTAKE / OUTPUT: I/O last 3 completed shifts: In: 779.6 [P.O.:60; I.V.:719.6] Out: 850 [Urine:850]  PHYSICAL EXAMINATION: General: Elderly gentleman, up to bedside HEENT: Oropharynx moist, pupils equal Neuro: Awake, interacting, nonfocal CV: Regular, no murmur PULM: Barely any right upper zone breath sounds, clear on the left GI: Soft, benign, positive bowel sounds Extremities: No edema Skin: No rash  LABS:  BMET Recent Labs  Lab 03/19/18 0841 03/21/18 1647 03/21/18 1845  NA 141 138 138  K 4.0 4.4 4.0  CL 102 101 102  CO2 27 25 26   BUN 8 11 12   CREATININE 0.92 1.00 0.96  GLUCOSE 113* 190* 193*    Electrolytes Recent Labs  Lab 03/19/18 0841 03/21/18 1647 03/21/18 1845  CALCIUM 9.5 8.9 9.0  MG  --  1.9  --   PHOS  --  4.0  --      CBC Recent Labs  Lab 03/19/18 0841 03/21/18 1647 03/22/18 0234  WBC 12.2* 14.0* 11.1*  HGB 11.9* 11.5* 10.5*  HCT 38.8* 37.7* 33.5*  PLT 259 209 237    Coag's Recent Labs  Lab 03/19/18 0841 03/21/18 1647  APTT 36  --   INR 1.13 1.13    Sepsis Markers Recent Labs  Lab 03/21/18 1647  PROCALCITON <0.10    ABG No results for input(s): PHART, PCO2ART, PO2ART in the last 168 hours.  Liver Enzymes Recent Labs  Lab 03/19/18 0841 03/21/18 1647  AST 13* 15  ALT 8* 8*  ALKPHOS 51 53  BILITOT 0.3 0.9  ALBUMIN 3.0* 3.0*    Cardiac Enzymes No results for input(s): TROPONINI, PROBNP in the last 168 hours.  Glucose Recent Labs  Lab 03/22/18 0423 03/22/18 0749 03/22/18 1157 03/22/18 1728 03/22/18 2120 03/23/18 0738  GLUCAP 147* 115* 165* 123* 107* 109*    Imaging No results found. STUDIES:  CT chest 02/27/2018 5.8 x 4.9 cm mass in the right mediastinum and hilum with associated severe narrowing of the right main pulmonary artery as well as right upper lobe and right middle lobe arteries. Obstructed right main stem bronchus and collapse of the right  upper lobe and right middle lobe. Small right effusion. The appearance is similar to the patient's chest radiograph from 02/20/2018.  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 Schedule MRI brain for staging evaluation I will transfer to [facility] hospital in preparation for radiation  therapy simulation and hopefully treatment.  CARDIOVASCULAR A:  Hx HTN, HLD, diastolic HF P:  Continue Lipitor, furosemide, hydralazine Continue benazepril  RENAL A:   No known acute issues  P:   Follow urine output, BMP Replace electro lites as indicated  GASTROINTESTINAL A:   Nutrition P:   Heart healthy diet  HEMATOLOGIC A:   No acute issues P:  Follow CBC intermittently Heparin for DVT prophylaxis  INFECTIOUS A:   No acute concerns for now P:   No current evidence for pneumonia although low threshold to treat given his right upper lobe obstruction Follow WBC  ENDOCRINE A:   DM P:   Probably restart metformin on 6/24 Sliding scale insulin as ordered  NEUROLOGIC A:   No acute issues  P:   Monitor   FAMILY  - Updates: Wife updated by Dr. Vaughan Browner on 6/21.  Family updated by Dr. Lamonte Sakai on 6/23  - Inter-disciplinary family meet or Palliative Care meeting due by:  6/28  Baltazar Apo, MD, PhD 03/23/2018, 12:09 PM [facility] [phone] or if no answer [phone]

## 2018-03-23 NOTE — Progress Notes (Signed)
Pt transferred to [facility] to [address]m 1613.  Report given to [employer] at [place]. [facility] called to pick up patient, and report was given to them.  Pt was transported via ambulance to [facility] this evening.

## 2018-03-23 NOTE — Progress Notes (Signed)
Oncology short note  Message my clinic RN and new patient scheduler to setup patient for clinic f/u on 6/26 or 6/27 at 3:40pm . Will f/u pathology as outpatient unless patient still in the hospital on Monday for clinical reasons.  MRI brain pre-discharge if possible , otherwise will do as outpatient. Thanks Sullivan Lone MD MS

## 2018-03-24 ENCOUNTER — Ambulatory Visit
Admit: 2018-03-24 | Discharge: 2018-03-24 | Disposition: A | Discharge status: Home / Self Care | Payer: Medicare HMO | Attending: Radiation Oncology | Admitting: Radiation Oncology

## 2018-03-24 ENCOUNTER — Ambulatory Visit
Admission: RE | Admit: 2018-03-24 | Discharge: 2018-03-24 | Disposition: A | Discharge status: Home / Self Care | Payer: Medicare HMO | Source: Ambulatory Visit | Attending: Radiation Oncology | Admitting: Radiation Oncology

## 2018-03-24 ENCOUNTER — Encounter: Payer: Self-pay | Admitting: Hematology

## 2018-03-24 ENCOUNTER — Encounter (HOSPITAL_COMMUNITY): Payer: Self-pay | Admitting: Pulmonary Disease

## 2018-03-24 DIAGNOSIS — Z510 Encounter for antineoplastic radiation therapy: Secondary | ICD-10-CM | POA: Insufficient documentation

## 2018-03-24 DIAGNOSIS — C3411 Malignant neoplasm of upper lobe, right bronchus or lung: Secondary | ICD-10-CM | POA: Insufficient documentation

## 2018-03-24 HISTORY — DX: Malignant neoplasm of upper lobe, right bronchus or lung: C34.11

## 2018-03-24 LAB — GLUCOSE, CAPILLARY
Glucose-Capillary: 167 mg/dL — ABNORMAL HIGH (ref 65–99)
Glucose-Capillary: 161 mg/dL — ABNORMAL HIGH (ref 65–99)

## 2018-03-24 NOTE — Progress Notes (Signed)
Patient discharged to home with family, discharge instructions reviewed with patient and wife who verbalized understanding. No new RX for patient.

## 2018-03-24 NOTE — Progress Notes (Signed)
[facility] Therapy Treatment Record Phone [phone]   Radiation Therapy was administered to Barry Booker on: 03/24/2018  3:11 PM and was treatment # 1 out of a planned course of 33 treatments.  Radiation Treatment  1). Beam photons with 6-10 energy  2). Brachytherapy None  3). Stereotactic Radiosurgery None  4). Other Radiation None     Barry Booker Barry Booker, RT (T)

## 2018-03-24 NOTE — Care Management Important Message (Signed)
Important Message  Patient Details  Name: Barry Booker MRN: [id number] Date of Birth: [birth year]   Medicare Important Message Given:  Yes    Kerin Salen 03/24/2018, 10:44 AMImportant Message  Patient Details  Name: Barry Booker MRN: [id number] Date of Birth: 15-Aug-1946   Medicare Important Message Given:  Yes    Kerin Salen 03/24/2018, 10:41 AM

## 2018-03-24 NOTE — Progress Notes (Signed)
Patient ambulated in hall 150 ft., oxygen saturation at lowest was 94-95%. Will continue to monitor.

## 2018-03-24 NOTE — Discharge Summary (Signed)
Physician Discharge Summary       Patient ID: Barry Booker MRN: [id number] DOB/AGE: Jul 12, 1946 72 y.o.  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  Initially referred to Dr. Vaughan Booker due to mediastinal right-sided lung mass with associated right upper lobe collapse.  Subsequent PET scanning on 6/10 demonstrated large intensely hypermetabolic right hilar mass.  He presented for elective bronchoscopy with surgical biopsies utilizing endobronchial ultrasound on 6/21.  Intraoperatively was noted to have right mainstem obstructive mass initial preliminary pathology showing atypical cells postoperatively had significant respiratory distress.  Given right upper lobe collapse and degree of work of breathing he was admitted to the intensive care with concern for worsening respiratory decline.  Both oncology and the aging oncology were consulted following procedure.  He was initially admitted to the intensive care given concern about need for intubation.  He was initially placed on noninvasive ventilation, and provided supportive care.  The next morning he felt much improved.  He was on room air and had no work of breathing.  Given the significant radiographic involvement of the right upper lobe he was transferred to [facility] the following day for further radiation oncology work-up including radiation marking and initial radiation therapy completed on 6/24. Walking oximetry was checked prior to dc. His O2 sats remained above 90% during ambulation so oxygen was not needed.  At time of discharge she is now medically stable for discharge with the plan as outlined below.   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  -at time of dc has completed first radiation therapy Plan F/u radiation/oncology 1/33 total therapies F/u w/ medical Onc   COPD Plan Cont Anoro Ellipta   HFpEF Plan Cont home Rx: asa, lipitor, lotensin, hydralazine and lasix    Diabetes Plan Resume metformin    Significant Hospital tests/ studies  Consults: Barry Booker and Barry Booker   Discharge Exam: Blood Pressure 106/75 (BP Location: Left Arm)   Pulse 76   Temperature 97.9 F (36.6 C) (Oral)   Respiration 16   Height 5\' 10"  (1.778 m)   Weight 203 lb 7.8 oz (92.3 kg)   Oxygen Saturation 98%   Body Mass Index 29.20 kg/m  Room air  General: 72 year old white male resting in bed. No acute distress  HENT: NCAT. No JVD. MMM Pulm: scattered rhonchi. Decreased sig on right but no accessory use  Card RRR w/out MRG abd soft not tender + bowel sounds  Ext: no edema brisk CR GU: voiding   Labs at discharge Lab Results  Component Value Date   CREATININE 0.96 03/21/2018   BUN 12 03/21/2018   NA 138 03/21/2018   K 4.0 03/21/2018   CL 102 03/21/2018   CO2 26 03/21/2018   Lab Results  Component Value Date   WBC 11.1 (H) 03/22/2018   HGB 10.5 (L) 03/22/2018   HCT 33.5 (L) 03/22/2018   MCV 89.3 03/22/2018   PLT 237 03/22/2018   Lab Results  Component Value Date   ALT 8 (L) 03/21/2018   AST 15 03/21/2018   ALKPHOS 53 03/21/2018   BILITOT 0.9 03/21/2018   Lab Results  Component Value Date   INR 1.13 03/21/2018  INR 1.13 03/19/2018   INR 1.2 (H) 03/04/2018    Current radiology studies Mr Barry Booker UL Contrast  Result Date: 03/23/2018 CLINICAL DATA:  Small-cell lung cancer staging. EXAM: MRI HEAD WITHOUT AND WITH CONTRAST TECHNIQUE: Multiplanar, multiecho pulse sequences of the brain and surrounding structures were obtained without and with intravenous contrast. CONTRAST:  53mL MULTIHANCE GADOBENATE DIMEGLUMINE 529 MG/ML IV SOLN COMPARISON:  None. FINDINGS:  Brain: There is no evidence of acute infarct, intracranial hemorrhage, mass, midline shift, or extra-axial fluid collection. There is moderately advanced generalized cerebral and cerebellar atrophy. No white matter disease is evident. No abnormal brain parenchymal or meningeal enhancement is identified. Vascular: Major intracranial vascular flow voids are preserved. Skull and upper cervical spine: Unremarkable bone marrow signal. Sinuses/Orbits: Unremarkable orbits. Left larger than right maxillary sinus mucous retention cysts. Mild bilateral ethmoid air cell mucosal thickening. Clear mastoid air cells. Other: None. IMPRESSION: 1. No evidence of intracranial metastases. 2. Moderately advanced brain parenchymal volume loss. Electronically Signed   By: Barry Booker M.D.   On: 03/23/2018 14:49    Disposition: home      Allergies as of 03/24/2018    Allergen Reactions Comment   Neomycin-bacitracin Zn-polymyx Rash       Medication List    Take these medications   ALPRAZolam 0.25 MG tablet Commonly known as:  XANAX Take 1 tablet (0.25 mg total) by mouth at bedtime as needed for anxiety. Take one table 4hr prior to scan and another 9min prior to scan if needed.   ANORO ELLIPTA 62.5-25 MCG/INH Aepb Generic drug:  umeclidinium-vilanterol Inhale 1 puff into the lungs daily.   aspirin 81 MG tablet Take 81 mg by mouth daily.   atorvastatin 40 MG tablet Commonly known as:  LIPITOR Take 40 mg by mouth daily.   benazepril 20 MG tablet Commonly known as:  LOTENSIN Take 10 mg by mouth daily.   COMBIVENT IN Inhale 2 puffs into the lungs every 6 (six) hours as needed (for shortness of breath/wheezing.).   folic acid 845 MCG tablet Commonly known as:  FOLVITE Take 400 mcg by mouth daily.   furosemide 40 MG tablet Commonly known as:  LASIX Take 20 mg by mouth 2 (two) times daily.   hydrALAZINE 10 MG tablet Commonly known as:  APRESOLINE Take 10 mg by mouth 2 (two) times daily.   metFORMIN  500 MG tablet Commonly known as:  GLUCOPHAGE Take 500 mg by mouth 2 (two) times daily with a meal.   potassium chloride SA 20 MEQ tablet Commonly known as:  K-DUR,KLOR-CON Take 20 mEq by mouth daily.        Discharged Condition: good  Physician Statement:   The Patient was personally examined, the discharge assessment and plan has been personally reviewed and I agree with ACNP Alora Gorey's assessment and plan. 32 minutes of time have been dedicated to discharge assessment, planning and discharge instructions.   Signed: Erick Colace ACNP-BC [place] Pager # [phone] OR # [phone] if no answer

## 2018-03-24 NOTE — Anesthesia Postprocedure Evaluation (Signed)
Anesthesia Post Note  Patient: REYAN HELLE  Procedure(s) Performed: VIDEO BRONCHOSCOPY WITH ENDOBRONCHIAL ULTRASOUND ([name] ) FLEXIBLE BRONCHOSCOPY ([name] )     Patient location during evaluation: PACU Anesthesia Type: General Level of consciousness: awake and alert Pain management: pain level controlled Vital Signs Assessment: post-procedure vital signs reviewed and stable Respiratory status: spontaneous breathing, nonlabored ventilation and respiratory function stable Cardiovascular status: blood pressure returned to baseline and stable Postop Assessment: no apparent nausea or vomiting Anesthetic complications: no    Last Vitals:  Vitals:   03/23/18 1912 03/24/18 0515  BP: 129/76 106/75  Pulse: 92 76  Resp: 15 16  Temp: 36.8 C 36.6 C  SpO2: 96% 98%    Last Pain:  Vitals:   03/24/18 0515  TempSrc: Oral  PainSc:                  Trameka Dorough,W. EDMOND

## 2018-03-25 ENCOUNTER — Ambulatory Visit
Admission: RE | Admit: 2018-03-25 | Discharge: 2018-03-25 | Disposition: A | Discharge status: Home / Self Care | Payer: Medicare HMO | Source: Ambulatory Visit | Attending: Radiation Oncology | Admitting: Radiation Oncology

## 2018-03-25 ENCOUNTER — Other Ambulatory Visit: Payer: Self-pay | Admitting: Radiation Oncology

## 2018-03-26 ENCOUNTER — Ambulatory Visit
Admission: RE | Admit: 2018-03-26 | Discharge: 2018-03-26 | Disposition: A | Discharge status: Home / Self Care | Payer: Medicare HMO | Source: Ambulatory Visit | Attending: Radiation Oncology | Admitting: Radiation Oncology

## 2018-03-26 DIAGNOSIS — Z510 Encounter for antineoplastic radiation therapy: Secondary | ICD-10-CM | POA: Diagnosis not present

## 2018-03-26 DIAGNOSIS — C3411 Malignant neoplasm of upper lobe, right bronchus or lung: Secondary | ICD-10-CM | POA: Diagnosis present

## 2018-03-27 ENCOUNTER — Inpatient Hospital Stay: Payer: Medicare HMO

## 2018-03-27 ENCOUNTER — Ambulatory Visit
Admission: RE | Admit: 2018-03-27 | Discharge: 2018-03-27 | Disposition: A | Discharge status: Home / Self Care | Payer: Medicare HMO | Source: Ambulatory Visit | Attending: Radiation Oncology | Admitting: Radiation Oncology

## 2018-03-27 ENCOUNTER — Inpatient Hospital Stay: Payer: Medicare HMO | Attending: Hematology | Admitting: Hematology

## 2018-03-27 ENCOUNTER — Other Ambulatory Visit: Payer: Self-pay | Admitting: Hematology

## 2018-03-27 VITALS — BP 124/72 | HR 99 | Temp 98.6°F | Resp 18 | Ht 70.0 in | Wt 206.8 lb

## 2018-03-27 DIAGNOSIS — C3401 Malignant neoplasm of right main bronchus: Secondary | ICD-10-CM | POA: Diagnosis not present

## 2018-03-27 DIAGNOSIS — J449 Chronic obstructive pulmonary disease, unspecified: Secondary | ICD-10-CM | POA: Diagnosis not present

## 2018-03-27 DIAGNOSIS — C3491 Malignant neoplasm of unspecified part of right bronchus or lung: Secondary | ICD-10-CM

## 2018-03-27 DIAGNOSIS — Z7189 Other specified counseling: Secondary | ICD-10-CM

## 2018-03-27 DIAGNOSIS — E119 Type 2 diabetes mellitus without complications: Secondary | ICD-10-CM | POA: Insufficient documentation

## 2018-03-27 DIAGNOSIS — R918 Other nonspecific abnormal finding of lung field: Secondary | ICD-10-CM

## 2018-03-27 DIAGNOSIS — I10 Essential (primary) hypertension: Secondary | ICD-10-CM

## 2018-03-27 DIAGNOSIS — Z510 Encounter for antineoplastic radiation therapy: Secondary | ICD-10-CM | POA: Diagnosis not present

## 2018-03-27 LAB — CMP (CANCER CENTER ONLY)
ALT: 11 U/L (ref 0–44)
ANION GAP: 7 (ref 5–15)
AST: 10 U/L — ABNORMAL LOW (ref 15–41)
Albumin: 2.7 g/dL — ABNORMAL LOW (ref 3.5–5.0)
Alkaline Phosphatase: 56 U/L (ref 38–126)
BUN: 13 mg/dL (ref 8–23)
CO2: 30 mmol/L (ref 22–32)
Calcium: 9.3 mg/dL (ref 8.9–10.3)
Chloride: 102 mmol/L (ref 98–111)
Creatinine: 1.07 mg/dL (ref 0.61–1.24)
GFR, Estimated: >60 mL/min (ref >60)
Glucose, Bld: 132 mg/dL — ABNORMAL HIGH (ref 70–99)
POTASSIUM: 3.7 mmol/L (ref 3.5–5.1)
SODIUM: 139 mmol/L (ref 135–145)
Total Bilirubin: 0.5 mg/dL (ref 0.3–1.2)
Total Protein: 7.4 g/dL (ref 6.5–8.1)

## 2018-03-27 LAB — CBC WITH DIFFERENTIAL/PLATELET
BASOS PCT: 1 %
Basophils Absolute: 0.1 10*3/uL (ref 0.0–0.1)
Eosinophils Absolute: 0.2 10*3/uL (ref 0.0–0.5)
Eosinophils Relative: 2 %
HEMATOCRIT: 33.9 % — AB (ref 38.4–49.9)
HEMOGLOBIN: 11 g/dL — AB (ref 13.0–17.1)
LYMPHS ABS: 1 10*3/uL (ref 0.9–3.3)
Lymphocytes Relative: 8 %
MCH: 28 pg (ref 27.2–33.4)
MCHC: 32.4 g/dL (ref 32.0–36.0)
MCV: 86.4 fL (ref 79.3–98.0)
MONOS PCT: 8 %
Monocytes Absolute: 1.1 10*3/uL — ABNORMAL HIGH (ref 0.1–0.9)
NEUTROS ABS: 10.8 10*3/uL — AB (ref 1.5–6.5)
NEUTROS PCT: 81 %
Platelets: 238 10*3/uL (ref 140–400)
RBC: 3.92 MIL/uL — ABNORMAL LOW (ref 4.20–5.82)
RDW: 14.7 % — ABNORMAL HIGH (ref 11.0–14.6)
WBC: 13.3 10*3/uL — ABNORMAL HIGH (ref 4.0–10.3)

## 2018-03-27 LAB — MAGNESIUM: Magnesium: 1.9 mg/dL (ref 1.7–2.4)

## 2018-03-27 NOTE — Progress Notes (Signed)
[name]    HEMATOLOGY/ONCOLOGY CONSULTATION NOTE  Date of Service: 03/27/2018  Patient Care Team: Bernerd Limbo, MD as PCP - General (Family Medicine)  CHIEF COMPLAINTS/PURPOSE OF CONSULTATION:  mf of newly diagnosed lung cancer  HISTORY OF PRESENTING ILLNESS:   Barry Booker is a wonderful 72 y.o. male who has been referred to [place] by Dr Marshell Garfinkel for evaluation and management of presumed newly diagnosed rt sided lung cancer with concern for bronchial obstruction.  Patient has a history of hypertension, dyslipidemia, diabetes, COPD, alcohol abuse and previously had a chest x-ray on 02/20/2018 at [facility] which showed Right upper lobe consolidation/atelectasis may relate to post obstructive pneumonitis/pneumonia. An underlying obstructing lesion should be considered. Consider CT for further assessment.  H subsequently then had a CT of the chest on 02/27/2018 which showed -5.8 x 4.9 cm mass in the right mediastinum and hilum with associated severe narrowing of the right main pulmonary artery as well as right upper lobe and right middle lobe arteries. Obstructed right main stem bronchus and collapse of the right upper lobe and right middle lobe. Small right effusion. The appearance is similar to the patient's chest radiograph from 02/20/2018.  Patient subsequently had a PET CT on 03/10/2018 by Dr. Vaughan Browner which showed Large intensely hypermetabolic RIGHT hilar mass obstructs the RIGHT upper lobe bronchus and surrounds the bronchus intermedius. Post obstructive collapse of the RIGHT upper lobe.  No hypermetabolic mediastinal or supraclavicular lymph nodes. No evidence distant metastatic disease.  Patient had a flexible video fiberoptic bronchoscopy done on 03/21/2018 by Dr. Vaughan Browner which showed which showed some nodularity of the right mainstem bronchial mucosa with near complete occlusion of right main stem. Unable to traverse the occluded part of right main stem. The mucosa was friable with propensity to  bleed. Endobronchial biopsies and brushings were taken from the R mainstem and right upper lobe for pathology.  Preliminary results showed atypical cells however final cytology/pathology is currently pending.  Patient report rt sided chest pain with deep inspiration, anorexia and weight loss of about 30lbs in the last 6 months.  Interval History:   Barry Booker returns to clinic today for management and evaluation of his newly diagnosed squamous cell carcinoma. I last saw this pt as an inpatient on 03/13/18. He is accompanied today by his wife. The pt reports that he is doing well overall.   The pt reports that his breathing has been stable and he doesn't have SOB while resting, but endorses SOB in mild exertion. He also notes that since at least last fall 2018, he has been losing his appetite.   The pt has been having radiation for the last four days, planning for up to 6-7 weeks total.   Of note since the patient's last visit, pt has had MRI Brain completed on 03/23/18 with results revealing No evidence of intracranial metastases. 2. Moderately advanced brain parenchymal volume loss.  Lab results today (03/27/18) of CBC, CMP, and Reticulocytes is as follows: all values are WNL except for WBC at 13.3k, RBC at 3.92, HGB at 11.0, HCT at 33.9, RDW at 14.7, ANC at 10.8k, Monocytes abs at 1.1k, Glucose at 132, Albumin at 2.7, AST at 10. Magnesium 03/27/18 normal at 1.9  On review of systems, pt reports SOB on exertion, decreased appetite, cough, some fatigue, and denies abdominal pains and any other symptoms.   MEDICAL HISTORY:  Past Medical History:  Diagnosis Date  . Alcohol abuse   . COPD (chronic obstructive pulmonary disease) ([place])   .  Diabetes mellitus without complication ([place])   . Gout   . High cholesterol   . Hypertension     SURGICAL HISTORY: Past Surgical History:  Procedure Laterality Date  . COLONOSCOPY    . FLEXIBLE BRONCHOSCOPY [name] 03/21/2018   Procedure: FLEXIBLE  BRONCHOSCOPY;  Surgeon: Marshell Garfinkel, MD;  Location: [place];  Service: Pulmonary;  Laterality: [name];  . NOSE SURGERY  90's  . VIDEO BRONCHOSCOPY WITH ENDOBRONCHIAL ULTRASOUND [name] 03/21/2018   Procedure: VIDEO BRONCHOSCOPY WITH ENDOBRONCHIAL ULTRASOUND;  Surgeon: Marshell Garfinkel, MD;  Location: [place];  Service: Pulmonary;  Laterality: [name];    SOCIAL HISTORY: Social History   Socioeconomic History  . Marital status: Married    Spouse name: Not on file  . Number of children: Not on file  . Years of education: Not on file  . Highest education level: Not on file  Occupational History  . Not on file  Social Needs  . Financial resource strain: Not on file  . Food insecurity:    Worry: Not on file    Inability: Not on file  . Transportation needs:    Medical: Not on file    Non-medical: Not on file  Tobacco Use  . Smoking status: Former Smoker    Packs/day: 0.25    Years: 50.00    Pack years: 12.50    Types: Cigarettes    Last attempt to quit: 07/2017    Years since quitting: 0.7  . Smokeless tobacco: Never Used  . Tobacco comment: quit smoking 07/2017  Substance and Sexual Activity  . Alcohol use: Yes    Alcohol/week: 8.4 - 12.6 oz    Types: 14 - 21 Cans of beer per week  . Drug use: No  . Sexual activity: Not on file  Lifestyle  . Physical activity:    Days per week: Not on file    Minutes per session: Not on file  . Stress: Not on file  Relationships  . Social connections:    Talks on phone: Not on file    Gets together: Not on file    Attends religious service: Not on file    Active member of club or organization: Not on file    Attends meetings of clubs or organizations: Not on file    Relationship status: Not on file  . Intimate partner violence:    Fear of current or ex partner: Not on file    Emotionally abused: Not on file    Physically abused: Not on file    Forced sexual activity: Not on file  Other Topics Concern  . Not on file  Social History  Narrative  . Not on file    FAMILY HISTORY: Family History  Problem Relation Age of Onset  . Arthritis Mother   . Cancer Father   . Cancer Sister   . Heart disease Maternal Uncle   . Colon cancer Neg Hx     ALLERGIES:  is allergic to neomycin-bacitracin zn-polymyx.  MEDICATIONS:  Current Outpatient Medications  Medication Sig Dispense Refill  . ALPRAZolam (XANAX) 0.25 MG tablet Take 1 tablet (0.25 mg total) by mouth at bedtime as needed for anxiety. Take one table 4hr prior to scan and another 6mn prior to scan if needed. (Patient not taking: Reported on 03/14/2018) 2 tablet 0  . aspirin 81 MG tablet Take 81 mg by mouth daily.    .Marland Kitchenatorvastatin (LIPITOR) 40 MG tablet Take 40 mg by mouth daily.     . benazepril (  LOTENSIN) 20 MG tablet Take 10 mg by mouth daily.     . folic acid (FOLVITE) 859 MCG tablet Take 400 mcg by mouth daily.    . furosemide (LASIX) 40 MG tablet Take 20 mg by mouth 2 (two) times daily.     . hydrALAZINE (APRESOLINE) 10 MG tablet Take 10 mg by mouth 2 (two) times daily.     . Ipratropium-Albuterol (COMBIVENT IN) Inhale 2 puffs into the lungs every 6 (six) hours as needed (for shortness of breath/wheezing.).    [name] metFORMIN (GLUCOPHAGE) 500 MG tablet Take 500 mg by mouth 2 (two) times daily with a meal.     . potassium chloride SA (K-DUR,KLOR-CON) 20 MEQ tablet Take 20 mEq by mouth daily.     [name] umeclidinium-vilanterol (ANORO ELLIPTA) 62.5-25 MCG/INH AEPB Inhale 1 puff into the lungs daily.      No current facility-administered medications for this visit.     REVIEW OF SYSTEMS:    A 10+ POINT REVIEW OF SYSTEMS WAS OBTAINED including neurology, dermatology, psychiatry, cardiac, respiratory, lymph, extremities, GI, GU, Musculoskeletal, constitutional, breasts, reproductive, HEENT.  All pertinent positives are noted in the HPI.  All others are negative.    PHYSICAL EXAMINATION: ECOG PERFORMANCE STATUS: 1 - Symptomatic but completely ambulatory  . Vitals:    03/27/18 1526  BP: 124/72  Pulse: 99  Resp: 18  Temp: 98.6 F (37 C)  SpO2: 96%   Filed Weights   03/27/18 1526  Weight: 206 lb 12.8 oz (93.8 kg)   .Body mass index is 29.67 kg/m.  GENERAL:alert, in no acute distress and comfortable SKIN: no acute rashes, no significant lesions EYES: conjunctiva are pink and non-injected, sclera anicteric OROPHARYNX: MMM, no exudates, no oropharyngeal erythema or ulceration NECK: supple, no JVD LYMPH:  no palpable lymphadenopathy in the cervical, axillary or inguinal regions LUNGS: decreased air entry Rt upper and mid zones HEART: regular rate & rhythm ABDOMEN:  normoactive bowel sounds , non tender, not distended. Extremity: no pedal edema PSYCH: alert & oriented x 3 with fluent speech NEURO: no focal motor/sensory deficits   LABORATORY DATA:  I have reviewed the data as listed  . CBC Latest Ref Rng & Units 03/27/2018 03/22/2018 03/21/2018  WBC 4.0 - 10.3 K/uL 13.3(H) 11.1(H) 14.0(H)  Hemoglobin 13.0 - 17.1 g/dL 11.0(L) 10.5(L) 11.5(L)  Hematocrit 38.4 - 49.9 % 33.9(L) 33.5(L) 37.7(L)  Platelets 140 - 400 K/uL 238 237 209    . CMP Latest Ref Rng & Units 03/27/2018 03/21/2018 03/21/2018  Glucose 70 - 99 mg/dL 132(H) 193(H) 190(H)  BUN 8 - 23 mg/dL _0 Creatinine 0.61 - 1.24 mg/dL 1.07 0.96 1.00  Sodium 135 - 145 mmol/L 139 138 138  Potassium 3.5 - 5.1 mmol/L 3.7 4.0 4.4  Chloride 98 - 111 mmol/L 102 102 101  CO2 22 - 32 mmol/L _1 Calcium 8.9 - 10.3 mg/dL 9.3 9.0 8.9  Total Protein 6.5 - 8.1 g/dL 7.4 - 7.8  Total Bilirubin 0.3 - 1.2 mg/dL 0.5 - 0.9  Alkaline Phos 38 - 126 U/L 56 - 53  AST 15 - 41 U/L 10(L) - 15  ALT 0 - 44 U/L 11 - 8(L)   03/21/18   03/25/18 Endobronchial bx:     RADIOGRAPHIC STUDIES: I have personally reviewed the radiological images as listed and agreed with the findings in the report. Mr Jeri Cos Wo Contrast  Result Date: 03/23/2018 CLINICAL DATA:  Small-cell lung cancer staging. EXAM: MRI HEAD  WITHOUT AND WITH CONTRAST TECHNIQUE: Multiplanar, multiecho pulse sequences of the brain and surrounding structures were obtained without and with intravenous contrast. CONTRAST:  49m MULTIHANCE GADOBENATE DIMEGLUMINE 529 MG/ML IV SOLN COMPARISON:  None. FINDINGS: Brain: There is no evidence of acute infarct, intracranial hemorrhage, mass, midline shift, or extra-axial fluid collection. There is moderately advanced generalized cerebral and cerebellar atrophy. No white matter disease is evident. No abnormal brain parenchymal or meningeal enhancement is identified. Vascular: Major intracranial vascular flow voids are preserved. Skull and upper cervical spine: Unremarkable bone marrow signal. Sinuses/Orbits: Unremarkable orbits. Left larger than right maxillary sinus mucous retention cysts. Mild bilateral ethmoid air cell mucosal thickening. Clear mastoid air cells. Other: None. IMPRESSION: 1. No evidence of intracranial metastases. 2. Moderately advanced brain parenchymal volume loss. Electronically Signed   By: ALogan BoresM.D.   On: 03/23/2018 14:49   Nm Pet Image Initial (pi) Skull Base To Thigh  Result Date: 03/10/2018 CLINICAL DATA:  Initial treatment strategy for pulmonary mass. EXAM: NUCLEAR MEDICINE PET SKULL BASE TO THIGH TECHNIQUE: 10.0 mCi F-18 FDG was injected intravenously. Full-ring PET imaging was performed from the skull base to thigh after the radiotracer. CT data was obtained and used for attenuation correction and anatomic localization. Fasting blood glucose: 104 mg/dl COMPARISON:  No recent chest CT available. FINDINGS: Mediastinal blood pool activity: SUV max 2.29 NECK: No hypermetabolic lymph nodes in the neck. Incidental CT findings: none CHEST: Large central RIGHT hilar mass is intensely hypermetabolic with SUV max equal 15.3. Mass is difficult to define on the noncontrast CT but measures approximately 5.6 x 4.6 on the PET imaging data set. Mass obliterates the RIGHT upper lobe bronchus  and surrounds the bronchus intermedius. Postobstructive collapse of the RIGHT upper lobe. There are no hypermetabolic mediastinal nodes. Nodes supraclavicular hypermetabolic nodes. Incidental CT findings: none ABDOMEN/PELVIS: No abnormal hypermetabolic activity within the liver, pancreas, adrenal glands, or spleen. No hypermetabolic lymph nodes in the abdomen or pelvis. Incidental CT findings: Atherosclerotic calcification aorta. Mild dilatation at 3.5 cm of the infrarenal abdominal aorta. Midline ventral hernia contains nonobstructed loop of small bowel. Small LEFT bladder diverticulum. SKELETON: No focal hypermetabolic activity to suggest skeletal metastasis. Incidental CT findings: none IMPRESSION: 1. Large intensely hypermetabolic RIGHT hilar mass obstructs the RIGHT upper lobe bronchus and surrounds the bronchus intermedius. 2. Post obstructive collapse of the RIGHT upper lobe. 3. No hypermetabolic mediastinal or supraclavicular lymph nodes. 4. No evidence distant metastatic disease. Electronically Signed   By: SSuzy BouchardM.D.   On: 03/10/2018 14:04   Dg Chest Port 1 View  Result Date: 03/22/2018 CLINICAL DATA:  Acute respiratory failure. EXAM: PORTABLE CHEST 1 VIEW COMPARISON:  03/21/2018 FINDINGS: The cardiomediastinal silhouette is unchanged. There is persistent cut off of the right mainstem bronchus by the known obstructing right hilar mass demonstrated on prior PET-CT. Right hemithorax volume loss is again noted with rightward mediastinal shift and dense right upper lung opacity corresponding to upper lobe collapse and consolidation. The right lower lung and left lung remain clear. No sizable pleural effusion or pneumothorax is identified. IMPRESSION: Unchanged postobstructive right upper lobe collapse. Electronically Signed   By: ALogan BoresM.D.   On: 03/22/2018 08:31   Dg Chest Portable 1 View  Result Date: 03/21/2018 CLINICAL DATA:  Status post bronchoscopy. EXAM: PORTABLE CHEST 1 VIEW  COMPARISON:  PET scan of March 10, 2018.  Radiograph January 12, 2016. FINDINGS: Stable cardiac size. Left lung is clear. No pneumothorax is noted. Large right upper lobe  airspace opacity is noted concerning for postobstructive atelectasis as noted on prior PET scan with underlying mass. Bony thorax is unremarkable. IMPRESSION: Large right upper lobe airspace opacity is noted consistent with postobstructive atelectasis secondary to central obstructive mass as noted on prior PET scan. No pneumothorax is noted status post bronchoscopy. Electronically Signed   By: Marijo Conception, M.D.   On: 03/21/2018 15:12   Dg C-arm Bronchoscopy  Result Date: 03/21/2018 C-ARM BRONCHOSCOPY: Fluoroscopy was utilized by the requesting physician.  No radiographic interpretation.    ASSESSMENT & PLAN:   72 y.o.  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 2. Post obstructive collapse of the RIGHT upper lobe. No overt distant mets on PET/CT  MRI brain - no mets  2) RUL airway obstruction with RUL lung collapse. 3) Concern for severe narrowing of the right main pulmonary artery as well as right upper lobe and right middle lobe arteries. Due to lung.  PLAN -patient is s/p FOB on 03/21/2018 with findings of RUL airway obstruction. -Discussed pt labwork today, 03/27/18; HGB at 11.0, ANC at 10.8k, Albumin at 2.7 -Discussed diagnosis of Primary lung, Squamous cell carcinoma, Non-small cell carcinoma -Discussed with pt and his wife that this is not surgically resectable as the involvement is quite central -Discussed that 03/23/18 Brain MRI did not reveal any evidence of intracranial metastases -Rediscussed 03/10/18 PET/CT with pt and his wife, which revealed large intensely hypermetabolic  Right hilar mass obstructs the Right upper lobe bronchus and surrounds the bronchus intermedus. Post obstructive collapse of the Right upper lobe. No hypermetabolic mediastinal or supraclavicular lymph nodes. No evidence of distant mets. -Reviewed the NCCN guidelines with the pt and his wife -Discussed that the chemotherapy goal is as a sensitizing medication to increase radiation efficacy, with curative intent but noted the possibility of disease return later -Stage III disease -Discussed that radiation alone would not be curative -Will plan to begin chemotherapy (weekly carboplatin/taxol) in 2 weeks -Will set pt up for chemo counseling -Offered pt a referral to our nutritional therapist - pt does not want this at this time    -chemo-counseling for concurrent carboplatin/taxol with RT -Schedule to start weekly carbo/taxol from 04/07/2018 -RTC with Dr Irene Limbo with labs on 04/07/2018 (ok to see in infusion)   All of the patients questions were answered with apparent satisfaction. The patient knows to call the clinic with any problems, questions or concerns.  . The total time spent in the appointment was 40 minutes and more than 50% was on counseling and direct patient cares.      Sullivan Lone MD MS AAHIVMS [facility] Hematology/Oncology Physician [facility]  (Office):       [phone] (Work cell):  [phone] (Fax):           [phone]  03/27/2018 4:27 PM  I, Baldwin [place], am acting as a Education administrator for Dr Irene Limbo.   .I have reviewed the above documentation for accuracy and completeness, and I agree with the above. Brunetta Genera MD

## 2018-03-28 ENCOUNTER — Telehealth: Payer: Self-pay

## 2018-03-28 ENCOUNTER — Ambulatory Visit
Admission: RE | Admit: 2018-03-28 | Discharge: 2018-03-28 | Disposition: A | Discharge status: Home / Self Care | Payer: Medicare HMO | Source: Ambulatory Visit | Attending: Radiation Oncology | Admitting: Radiation Oncology

## 2018-03-28 DIAGNOSIS — Z510 Encounter for antineoplastic radiation therapy: Secondary | ICD-10-CM | POA: Diagnosis not present

## 2018-03-28 DIAGNOSIS — R918 Other nonspecific abnormal finding of lung field: Secondary | ICD-10-CM

## 2018-03-28 MED ORDER — SONAFINE EX EMUL
1.0000 "application " | Freq: Once | CUTANEOUS | Status: AC
  Administered 2018-03-28: 1 via TOPICAL

## 2018-03-28 NOTE — Telephone Encounter (Signed)
Called and left a message with on patient wife cell phone. Per 6/27 los requested they stop by office and pick up schedul. Appointment on it for 7/5, also mailed a calender with a letter enclosed.

## 2018-03-28 NOTE — Progress Notes (Signed)
Pt here for patient teaching.  Pt given Radiation and You booklet.  Reviewed areas of pertinence such as fatigue, hair loss, skin changes and throat changes . Pt able to give teach back of to pat skin,apply Sonafine bid and avoid applying anything to skin within 4 hours of treatment. Pt verbalizes understanding of information given and will contact nursing with any questions or concerns.    Gloriajean Dell. Leonie Green, BSN

## 2018-03-29 ENCOUNTER — Encounter: Payer: Self-pay | Admitting: Radiation Oncology

## 2018-03-29 DIAGNOSIS — C3411 Malignant neoplasm of upper lobe, right bronchus or lung: Secondary | ICD-10-CM

## 2018-03-29 HISTORY — DX: Malignant neoplasm of upper lobe, right bronchus or lung: C34.11

## 2018-03-29 NOTE — Progress Notes (Signed)
  Radiation Oncology         [phone] ________________________________  Name: Barry Booker MRN: [id number]  Date: 03/24/2018  DOB: 05-19-1946  SIMULATION AND TREATMENT PLANNING NOTE  DIAGNOSIS:     ICD-10-CM   1. Malignant neoplasm of right upper lobe of lung ([place]) C34.11      Site:  chest  NARRATIVE:  The patient was brought to the [facility].  Identity was confirmed.  All relevant records and images related to the planned course of therapy were reviewed.   Written consent to proceed with treatment was confirmed which was freely given after reviewing the details related to the planned course of therapy had been reviewed with the patient.  Then, the patient was set-up in a stable reproducible  supine position for radiation therapy.  CT images were obtained.  Surface markings were placed.    Medically necessary complex treatment device(s) for immobilization:  Vac-lock bag.   The CT images were loaded into the planning software.  Then the target and avoidance structures were contoured.  Treatment planning then occurred.  The radiation prescription was entered and confirmed.  A total of 4 complex treatment devices were fabricated which relate to the designed radiation treatment fields. Additional reduced fields will be used as necessary to improve the dose homogeneity of the plan. Each of these customized fields/ complex treatment devices will be used on a daily basis during the radiation course. I have requested : 3D Simulation  I have requested a DVH of the following structures: target volume, spinal cord, lungs, heart.   The patient will undergo daily image guidance to ensure accurate localization of the target, and adequate minimize dose to the normal surrounding structures in close proximity to the target.  PLAN:  The patient will receive 6 Gy in 3 fractions initially. The patient will then receive a 60 Gy boost for a final dose of 66 Gy.  Special treatment  procedure The patient will also receive concurrent chemotherapy during the treatment. The patient may therefore experience increased toxicity or side effects and the patient will be monitored for such problems. This may require extra lab work as necessary. This therefore constitutes a special treatment procedure.   ________________________________   Jodelle Gross, MD, PhD

## 2018-03-31 ENCOUNTER — Ambulatory Visit
Admission: RE | Admit: 2018-03-31 | Discharge: 2018-03-31 | Disposition: A | Discharge status: Home / Self Care | Payer: Medicare HMO | Source: Ambulatory Visit | Attending: Radiation Oncology | Admitting: Radiation Oncology

## 2018-03-31 DIAGNOSIS — C3411 Malignant neoplasm of upper lobe, right bronchus or lung: Secondary | ICD-10-CM | POA: Insufficient documentation

## 2018-03-31 DIAGNOSIS — Z510 Encounter for antineoplastic radiation therapy: Secondary | ICD-10-CM | POA: Insufficient documentation

## 2018-04-01 ENCOUNTER — Ambulatory Visit
Admission: RE | Admit: 2018-04-01 | Discharge: 2018-04-01 | Disposition: A | Discharge status: Home / Self Care | Payer: Medicare HMO | Source: Ambulatory Visit | Attending: Radiation Oncology | Admitting: Radiation Oncology

## 2018-04-01 DIAGNOSIS — Z510 Encounter for antineoplastic radiation therapy: Secondary | ICD-10-CM | POA: Diagnosis not present

## 2018-04-02 ENCOUNTER — Other Ambulatory Visit: Payer: Self-pay | Admitting: Hematology

## 2018-04-02 ENCOUNTER — Inpatient Hospital Stay: Payer: Medicare HMO | Admitting: Hematology

## 2018-04-02 ENCOUNTER — Ambulatory Visit
Admission: RE | Admit: 2018-04-02 | Discharge: 2018-04-02 | Disposition: A | Discharge status: Home / Self Care | Payer: Medicare HMO | Source: Ambulatory Visit | Attending: Radiation Oncology | Admitting: Radiation Oncology

## 2018-04-02 DIAGNOSIS — Z510 Encounter for antineoplastic radiation therapy: Secondary | ICD-10-CM | POA: Diagnosis not present

## 2018-04-04 ENCOUNTER — Ambulatory Visit
Admission: RE | Admit: 2018-04-04 | Discharge: 2018-04-04 | Disposition: A | Discharge status: Home / Self Care | Payer: Medicare HMO | Source: Ambulatory Visit | Attending: Radiation Oncology | Admitting: Radiation Oncology

## 2018-04-04 ENCOUNTER — Inpatient Hospital Stay: Payer: Medicare HMO | Attending: Hematology

## 2018-04-04 DIAGNOSIS — J988 Other specified respiratory disorders: Secondary | ICD-10-CM | POA: Insufficient documentation

## 2018-04-04 DIAGNOSIS — C3401 Malignant neoplasm of right main bronchus: Secondary | ICD-10-CM | POA: Insufficient documentation

## 2018-04-04 DIAGNOSIS — R634 Abnormal weight loss: Secondary | ICD-10-CM | POA: Insufficient documentation

## 2018-04-04 DIAGNOSIS — J449 Chronic obstructive pulmonary disease, unspecified: Secondary | ICD-10-CM | POA: Insufficient documentation

## 2018-04-04 DIAGNOSIS — E119 Type 2 diabetes mellitus without complications: Secondary | ICD-10-CM | POA: Insufficient documentation

## 2018-04-04 DIAGNOSIS — Z510 Encounter for antineoplastic radiation therapy: Secondary | ICD-10-CM | POA: Diagnosis not present

## 2018-04-04 DIAGNOSIS — C3491 Malignant neoplasm of unspecified part of right bronchus or lung: Secondary | ICD-10-CM | POA: Insufficient documentation

## 2018-04-04 DIAGNOSIS — I10 Essential (primary) hypertension: Secondary | ICD-10-CM | POA: Insufficient documentation

## 2018-04-04 DIAGNOSIS — Z7189 Other specified counseling: Secondary | ICD-10-CM | POA: Insufficient documentation

## 2018-04-04 DIAGNOSIS — R630 Anorexia: Secondary | ICD-10-CM | POA: Insufficient documentation

## 2018-04-04 DIAGNOSIS — Z5111 Encounter for antineoplastic chemotherapy: Secondary | ICD-10-CM | POA: Insufficient documentation

## 2018-04-04 NOTE — Progress Notes (Signed)
START ON PATHWAY REGIMEN - Non-Small Cell Lung     Administer weekly:     Paclitaxel      Carboplatin   **Always confirm dose/schedule in your pharmacy ordering system**  Patient Characteristics: Stage III - Unresectable, PS = 0, 1 AJCC T Category: TX Current Disease Status: No Distant Mets or Local Recurrence AJCC N Category: NX AJCC M Category: M0 AJCC 8 Stage Grouping: IIIA Performance Status: PS = 0, 1 Intent of Therapy: Non-Curative / Palliative Intent, Discussed with Patient

## 2018-04-07 ENCOUNTER — Encounter: Payer: Self-pay | Admitting: Pulmonary Disease

## 2018-04-07 ENCOUNTER — Ambulatory Visit
Admission: RE | Admit: 2018-04-07 | Discharge: 2018-04-07 | Disposition: A | Discharge status: Home / Self Care | Payer: Medicare HMO | Source: Ambulatory Visit | Attending: Radiation Oncology | Admitting: Radiation Oncology

## 2018-04-07 ENCOUNTER — Inpatient Hospital Stay: Payer: Medicare HMO | Admitting: Hematology

## 2018-04-07 ENCOUNTER — Ambulatory Visit: Payer: Medicare HMO | Admitting: Pulmonary Disease

## 2018-04-07 ENCOUNTER — Inpatient Hospital Stay: Payer: Medicare HMO

## 2018-04-07 VITALS — BP 118/70 | HR 93 | Ht 70.0 in | Wt 204.8 lb

## 2018-04-07 VITALS — BP 115/81 | HR 77 | Temp 98.3°F | Resp 16

## 2018-04-07 VITALS — BP 112/70 | HR 95 | Temp 98.5°F | Resp 17 | Ht 70.0 in | Wt 204.8 lb

## 2018-04-07 DIAGNOSIS — Z7189 Other specified counseling: Secondary | ICD-10-CM

## 2018-04-07 DIAGNOSIS — C3491 Malignant neoplasm of unspecified part of right bronchus or lung: Secondary | ICD-10-CM

## 2018-04-07 DIAGNOSIS — R634 Abnormal weight loss: Secondary | ICD-10-CM | POA: Diagnosis not present

## 2018-04-07 DIAGNOSIS — R630 Anorexia: Secondary | ICD-10-CM | POA: Diagnosis not present

## 2018-04-07 DIAGNOSIS — J988 Other specified respiratory disorders: Secondary | ICD-10-CM | POA: Diagnosis not present

## 2018-04-07 DIAGNOSIS — Z5111 Encounter for antineoplastic chemotherapy: Secondary | ICD-10-CM | POA: Diagnosis present

## 2018-04-07 DIAGNOSIS — J449 Chronic obstructive pulmonary disease, unspecified: Secondary | ICD-10-CM

## 2018-04-07 DIAGNOSIS — C3411 Malignant neoplasm of upper lobe, right bronchus or lung: Secondary | ICD-10-CM

## 2018-04-07 DIAGNOSIS — I10 Essential (primary) hypertension: Secondary | ICD-10-CM

## 2018-04-07 DIAGNOSIS — E119 Type 2 diabetes mellitus without complications: Secondary | ICD-10-CM

## 2018-04-07 DIAGNOSIS — C3401 Malignant neoplasm of right main bronchus: Secondary | ICD-10-CM

## 2018-04-07 DIAGNOSIS — Z510 Encounter for antineoplastic radiation therapy: Secondary | ICD-10-CM | POA: Diagnosis not present

## 2018-04-07 LAB — CMP (CANCER CENTER ONLY)
ALBUMIN: 2.4 g/dL — AB (ref 3.5–5.0)
ALK PHOS: 57 U/L (ref 38–126)
ALT: 11 U/L (ref 0–44)
ANION GAP: 8 (ref 5–15)
AST: 11 U/L — ABNORMAL LOW (ref 15–41)
BUN: 10 mg/dL (ref 8–23)
CHLORIDE: 100 mmol/L (ref 98–111)
CO2: 28 mmol/L (ref 22–32)
Calcium: 9.4 mg/dL (ref 8.9–10.3)
Creatinine: 0.89 mg/dL (ref 0.61–1.24)
GFR, Estimated: >60 mL/min (ref >60)
GLUCOSE: 167 mg/dL — AB (ref 70–99)
Potassium: 4 mmol/L (ref 3.5–5.1)
SODIUM: 136 mmol/L (ref 135–145)
Total Bilirubin: 0.3 mg/dL (ref 0.3–1.2)
Total Protein: 7.6 g/dL (ref 6.5–8.1)

## 2018-04-07 LAB — MAGNESIUM: Magnesium: 2.1 mg/dL (ref 1.7–2.4)

## 2018-04-07 LAB — CBC WITH DIFFERENTIAL (CANCER CENTER ONLY)
Basophils Absolute: 0 10*3/uL (ref 0.0–0.1)
Basophils Relative: 0 %
EOS ABS: 0.2 10*3/uL (ref 0.0–0.5)
Eosinophils Relative: 2 %
HEMATOCRIT: 32.7 % — AB (ref 38.4–49.9)
Hemoglobin: 10.2 g/dL — ABNORMAL LOW (ref 13.0–17.1)
LYMPHS ABS: 0.7 10*3/uL — AB (ref 0.9–3.3)
Lymphocytes Relative: 6 %
MCH: 27.5 pg (ref 27.2–33.4)
MCHC: 31.2 g/dL — AB (ref 32.0–36.0)
MCV: 88.1 fL (ref 79.3–98.0)
MONOS PCT: 8 %
Monocytes Absolute: 0.8 10*3/uL (ref 0.1–0.9)
NEUTROS PCT: 84 %
Neutro Abs: 9.3 10*3/uL — ABNORMAL HIGH (ref 1.5–6.5)
Platelet Count: 308 10*3/uL (ref 140–400)
RBC: 3.71 MIL/uL — AB (ref 4.20–5.82)
RDW: 14.3 % (ref 11.0–14.6)
WBC: 11.1 10*3/uL — AB (ref 4.0–10.3)

## 2018-04-07 LAB — RETICULOCYTES
RBC.: 3.71 MIL/uL — ABNORMAL LOW (ref 4.20–5.82)
RETIC CT PCT: 0.9 % (ref 0.8–1.8)
Retic Count, Absolute: 33.4 10*3/uL — ABNORMAL LOW (ref 34.8–93.9)

## 2018-04-07 MED ORDER — DIPHENHYDRAMINE HCL 50 MG/ML IJ SOLN
INTRAMUSCULAR | Status: AC
  Filled 2018-04-07: qty 1

## 2018-04-07 MED ORDER — DEXAMETHASONE 4 MG PO TABS: 8.0000 mg | ORAL_TABLET | Freq: Every day | ORAL | 1 refills | Status: DC

## 2018-04-07 MED ORDER — UMECLIDINIUM-VILANTEROL 62.5-25 MCG/INH IN AEPB: 1.0000 | INHALATION_SPRAY | Freq: Every day | RESPIRATORY_TRACT | 0 refills | Status: AC

## 2018-04-07 MED ORDER — SODIUM CHLORIDE 0.9 % IV SOLN
45.0000 mg/m2 | Freq: Once | INTRAVENOUS | Status: AC
  Administered 2018-04-07: 96 mg via INTRAVENOUS
  Filled 2018-04-07: qty 16

## 2018-04-07 MED ORDER — SODIUM CHLORIDE 0.9 % IV SOLN
232.4000 mg | Freq: Once | INTRAVENOUS | Status: AC
  Administered 2018-04-07: 230 mg via INTRAVENOUS
  Filled 2018-04-07: qty 23

## 2018-04-07 MED ORDER — SODIUM CHLORIDE 0.9 % IV SOLN
20.0000 mg | Freq: Once | INTRAVENOUS | Status: AC
  Administered 2018-04-07: 20 mg via INTRAVENOUS
  Filled 2018-04-07: qty 2

## 2018-04-07 MED ORDER — DIPHENHYDRAMINE HCL 50 MG/ML IJ SOLN
50.0000 mg | Freq: Once | INTRAMUSCULAR | Status: AC
  Administered 2018-04-07: 50 mg via INTRAVENOUS

## 2018-04-07 MED ORDER — PROCHLORPERAZINE MALEATE 10 MG PO TABS: 10.0000 mg | ORAL_TABLET | Freq: Four times a day (QID) | ORAL | 1 refills | Status: DC | PRN

## 2018-04-07 MED ORDER — PALONOSETRON HCL INJECTION 0.25 MG/5ML
INTRAVENOUS | Status: AC
  Filled 2018-04-07: qty 5

## 2018-04-07 MED ORDER — PALONOSETRON HCL INJECTION 0.25 MG/5ML
0.2500 mg | Freq: Once | INTRAVENOUS | Status: AC
  Administered 2018-04-07: 0.25 mg via INTRAVENOUS

## 2018-04-07 MED ORDER — SODIUM CHLORIDE 0.9 % IV SOLN
Freq: Once | INTRAVENOUS | Status: AC
  Administered 2018-04-07: 11:00:00 via INTRAVENOUS

## 2018-04-07 MED ORDER — FAMOTIDINE IN NACL 20-0.9 MG/50ML-% IV SOLN
INTRAVENOUS | Status: AC
  Filled 2018-04-07: qty 50

## 2018-04-07 MED ORDER — ONDANSETRON HCL 8 MG PO TABS: 8.0000 mg | ORAL_TABLET | Freq: Two times a day (BID) | ORAL | 1 refills | Status: DC | PRN

## 2018-04-07 MED ORDER — FAMOTIDINE IN NACL 20-0.9 MG/50ML-% IV SOLN
20.0000 mg | Freq: Once | INTRAVENOUS | Status: AC
  Administered 2018-04-07: 20 mg via INTRAVENOUS

## 2018-04-07 NOTE — Patient Instructions (Signed)
Continue the Anoro.  We will give you samples of the inhaler I will see you back in clinic in 6 months.  Please call us sooner if there is any worsening of your breathing symptoms.

## 2018-04-07 NOTE — Progress Notes (Signed)
[name]    HEMATOLOGY/ONCOLOGY CONSULTATION NOTE  Date of Service: 04/07/2018  Patient Care Team: Bernerd Limbo, MD as PCP - General (Family Medicine)  CHIEF COMPLAINTS/PURPOSE OF CONSULTATION:  mf of newly diagnosed lung cancer  HISTORY OF PRESENTING ILLNESS:   Barry Booker is a wonderful 72 y.o. male who has been referred to [place] by Dr Marshell Garfinkel for evaluation and management of presumed newly diagnosed rt sided lung cancer with concern for bronchial obstruction.  Patient has a history of hypertension, dyslipidemia, diabetes, COPD, alcohol abuse and previously had a chest x-ray on 02/20/2018 at [facility] which showed Right upper lobe consolidation/atelectasis may relate to post obstructive pneumonitis/pneumonia. An underlying obstructing lesion should be considered. Consider CT for further assessment.  H subsequently then had a CT of the chest on 02/27/2018 which showed -5.8 x 4.9 cm mass in the right mediastinum and hilum with associated severe narrowing of the right main pulmonary artery as well as right upper lobe and right middle lobe arteries. Obstructed right main stem bronchus and collapse of the right upper lobe and right middle lobe. Small right effusion. The appearance is similar to the patient's chest radiograph from 02/20/2018.  Patient subsequently had a PET CT on 03/10/2018 by Dr. Vaughan Browner which showed Large intensely hypermetabolic RIGHT hilar mass obstructs the RIGHT upper lobe bronchus and surrounds the bronchus intermedius. Post obstructive collapse of the RIGHT upper lobe.  No hypermetabolic mediastinal or supraclavicular lymph nodes. No evidence distant metastatic disease.  Patient had a flexible video fiberoptic bronchoscopy done on 03/21/2018 by Dr. Vaughan Browner which showed which showed some nodularity of the right mainstem bronchial mucosa with near complete occlusion of right main stem. Unable to traverse the occluded part of right main stem. The mucosa was friable with propensity to  bleed. Endobronchial biopsies and brushings were taken from the R mainstem and right upper lobe for pathology.  Preliminary results showed atypical cells however final cytology/pathology is currently pending.  Patient report rt sided chest pain with deep inspiration, anorexia and weight loss of about 30lbs in the last 6 months.  Interval History:   Barry Booker returns to clinic today for management and evaluation of his newly diagnosed squamous cell carcinoma. The patient's last visit with [place] was on 03/27/18. He is accompanied today by his partner. The pt reports that he is doing well overall.   The pt reports that he is ready to begin chemotherapy today and has tolerated radiation well so far. His partner notes that he has not been eating very well, but has consumed a couple boost/ensure over the course of a week. He notes that he is sleeping well.   He attended chemotherapy counseling.   The pt notes that his blood sugars never go above 200 and he is continuing to take Metformin 539m BID. He has lost a couple pounds in the last week. He has lost nearly 40 pounds in the last year and notes that his weight was at 240 pounds a year ago.   The pt notes that he has plans to begin using his cane and he is not open to attending PT at this time. His partner notes that he is weak and has fallen in the past several months. His partner notes that he is able to leave the house, and the pt denies being home bound at this time.   Lab results today (04/07/18) of CBC w/diff, CMP, and Reticulocytes is as follows: all values are WNL except for WBC at 11.1k,  RBC at 3.71, HGB at 10.2, HCT at 32.7, MCHC at 31.2, ANC at 9.3k, Lymphs abs at 700, Glucose at 167, Albumin at 2.4, AST at 11, Retic ct abs at 33.4k. Magnesium 04/07/18 is WNL at 2.1.   On review of systems, pt reports weak appetite, stable breathing, some continued weight loss, and denies nausea, abdominal pains, and any other symptoms.   MEDICAL HISTORY:   Past Medical History:  Diagnosis Date  . Alcohol abuse   . COPD (chronic obstructive pulmonary disease) ([place])   . Diabetes mellitus without complication ([place])   . Gout   . High cholesterol   . Hypertension   . Malignant neoplasm of right upper lobe of lung ([place]) 03/29/2018    SURGICAL HISTORY: Past Surgical History:  Procedure Laterality Date  . COLONOSCOPY    . FLEXIBLE BRONCHOSCOPY [name] 03/21/2018   Procedure: FLEXIBLE BRONCHOSCOPY;  Surgeon: Marshell Garfinkel, MD;  Location: [place];  Service: Pulmonary;  Laterality: [name];  . NOSE SURGERY  90's  . VIDEO BRONCHOSCOPY WITH ENDOBRONCHIAL ULTRASOUND [name] 03/21/2018   Procedure: VIDEO BRONCHOSCOPY WITH ENDOBRONCHIAL ULTRASOUND;  Surgeon: Marshell Garfinkel, MD;  Location: [place];  Service: Pulmonary;  Laterality: [name];    SOCIAL HISTORY: Social History   Socioeconomic History  . Marital status: Married    Spouse name: Not on file  . Number of children: Not on file  . Years of education: Not on file  . Highest education level: Not on file  Occupational History  . Not on file  Social Needs  . Financial resource strain: Not on file  . Food insecurity:    Worry: Not on file    Inability: Not on file  . Transportation needs:    Medical: Not on file    Non-medical: Not on file  Tobacco Use  . Smoking status: Former Smoker    Packs/day: 0.25    Years: 50.00    Pack years: 12.50    Types: Cigarettes    Last attempt to quit: 07/2017    Years since quitting: 0.7  . Smokeless tobacco: Never Used  . Tobacco comment: quit smoking 07/2017  Substance and Sexual Activity  . Alcohol use: Yes    Alcohol/week: 8.4 - 12.6 oz    Types: 14 - 21 Cans of beer per week  . Drug use: No  . Sexual activity: Not on file  Lifestyle  . Physical activity:    Days per week: Not on file    Minutes per session: Not on file  . Stress: Not on file  Relationships  . Social connections:    Talks on phone: Not on file    Gets together: Not on file     Attends religious service: Not on file    Active member of club or organization: Not on file    Attends meetings of clubs or organizations: Not on file    Relationship status: Not on file  . Intimate partner violence:    Fear of current or ex partner: Not on file    Emotionally abused: Not on file    Physically abused: Not on file    Forced sexual activity: Not on file  Other Topics Concern  . Not on file  Social History Narrative  . Not on file    FAMILY HISTORY: Family History  Problem Relation Age of Onset  . Arthritis Mother   . Cancer Father   . Cancer Sister   . Heart disease Maternal Uncle   . Colon  cancer Neg Hx     ALLERGIES:  is allergic to neomycin-bacitracin zn-polymyx.  MEDICATIONS:  Current Outpatient Medications  Medication Sig Dispense Refill  . aspirin 81 MG tablet Take 81 mg by mouth daily.    [name] atorvastatin (LIPITOR) 40 MG tablet Take 40 mg by mouth daily.     . benazepril (LOTENSIN) 20 MG tablet Take 10 mg by mouth daily.     [name] dexamethasone (DECADRON) 4 MG tablet Take 2 tablets (8 mg total) by mouth daily. Start the day after chemotherapy for 2 days. 30 tablet 1  . folic acid (FOLVITE) 950 MCG tablet Take 400 mcg by mouth daily.    . furosemide (LASIX) 40 MG tablet Take 20 mg by mouth 2 (two) times daily.     . hydrALAZINE (APRESOLINE) 10 MG tablet Take 10 mg by mouth 2 (two) times daily.     . Ipratropium-Albuterol (COMBIVENT IN) Inhale 2 puffs into the lungs every 6 (six) hours as needed (for shortness of breath/wheezing.).    [name] metFORMIN (GLUCOPHAGE) 500 MG tablet Take 500 mg by mouth 2 (two) times daily with a meal.     . ondansetron (ZOFRAN) 8 MG tablet Take 1 tablet (8 mg total) by mouth 2 (two) times daily as needed for refractory nausea / vomiting. Start on day 3 after chemo. 30 tablet 1  . potassium chloride SA (K-DUR,KLOR-CON) 20 MEQ tablet Take 20 mEq by mouth daily.     . prochlorperazine (COMPAZINE) 10 MG tablet Take 1 tablet (10 mg total) by  mouth every 6 (six) hours as needed (Nausea or vomiting). 30 tablet 1  . umeclidinium-vilanterol (ANORO ELLIPTA) 62.5-25 MCG/INH AEPB Inhale 1 puff into the lungs daily.     [name] umeclidinium-vilanterol (ANORO ELLIPTA) 62.5-25 MCG/INH AEPB Inhale 1 puff into the lungs daily for 1 day. 14 each 0   No current facility-administered medications for this visit.     REVIEW OF SYSTEMS:    A 10+ POINT REVIEW OF SYSTEMS WAS OBTAINED including neurology, dermatology, psychiatry, cardiac, respiratory, lymph, extremities, GI, GU, Musculoskeletal, constitutional, breasts, reproductive, HEENT.  All pertinent positives are noted in the HPI.  All others are negative.   PHYSICAL EXAMINATION: ECOG PERFORMANCE STATUS: 1 - Symptomatic but completely ambulatory  . Vitals:   04/07/18 1023  BP: 112/70  Pulse: 95  Resp: 17  Temp: 98.5 F (36.9 C)  SpO2: 99%   Filed Weights   04/07/18 1023  Weight: 204 lb 12.8 oz (92.9 kg)   .Body mass index is 29.39 kg/m.  GENERAL:alert, in no acute distress and comfortable SKIN: no acute rashes, no significant lesions EYES: conjunctiva are pink and non-injected, sclera anicteric OROPHARYNX: MMM, no exudates, no oropharyngeal erythema or ulceration NECK: supple, no JVD LYMPH:  no palpable lymphadenopathy in the cervical, axillary or inguinal regions LUNGS: decreased air entry Rt upper and mid zones HEART: regular rate & rhythm ABDOMEN:  normoactive bowel sounds , non tender, not distended. No palpable hepatosplenomegaly.  Extremity: no pedal edema PSYCH: alert & oriented x 3 with fluent speech NEURO: no focal motor/sensory deficits  LABORATORY DATA:  I have reviewed the data as listed  . CBC Latest Ref Rng & Units 04/07/2018 03/27/2018 03/22/2018  WBC 4.0 - 10.3 K/uL 11.1(H) 13.3(H) 11.1(H)  Hemoglobin 13.0 - 17.1 g/dL 10.2(L) 11.0(L) 10.5(L)  Hematocrit 38.4 - 49.9 % 32.7(L) 33.9(L) 33.5(L)  Platelets 140 - 400 K/uL 308 238 237    . CMP Latest Ref Rng &  Units 04/07/2018 03/27/2018  03/21/2018  Glucose 70 - 99 mg/dL 167(H) 132(H) 193(H)  BUN 8 - 23 mg/dL _0 Creatinine 0.61 - 1.24 mg/dL 0.89 1.07 0.96  Sodium 135 - 145 mmol/L 136 139 138  Potassium 3.5 - 5.1 mmol/L 4.0 3.7 4.0  Chloride 98 - 111 mmol/L 100 102 102  CO2 22 - 32 mmol/L _1 Calcium 8.9 - 10.3 mg/dL 9.4 9.3 9.0  Total Protein 6.5 - 8.1 g/dL 7.6 7.4 -  Total Bilirubin 0.3 - 1.2 mg/dL 0.3 0.5 -  Alkaline Phos 38 - 126 U/L 57 56 -  AST 15 - 41 U/L 11(L) 10(L) -  ALT 0 - 44 U/L 11 11 -   03/21/18   03/25/18 Endobronchial bx:     RADIOGRAPHIC STUDIES: I have personally reviewed the radiological images as listed and agreed with the findings in the report. Mr Jeri Cos Wo Contrast  Result Date: 03/23/2018 CLINICAL DATA:  Small-cell lung cancer staging. EXAM: MRI HEAD WITHOUT AND WITH CONTRAST TECHNIQUE: Multiplanar, multiecho pulse sequences of the brain and surrounding structures were obtained without and with intravenous contrast. CONTRAST:  67m MULTIHANCE GADOBENATE DIMEGLUMINE 529 MG/ML IV SOLN COMPARISON:  None. FINDINGS: Brain: There is no evidence of acute infarct, intracranial hemorrhage, mass, midline shift, or extra-axial fluid collection. There is moderately advanced generalized cerebral and cerebellar atrophy. No white matter disease is evident. No abnormal brain parenchymal or meningeal enhancement is identified. Vascular: Major intracranial vascular flow voids are preserved. Skull and upper cervical spine: Unremarkable bone marrow signal. Sinuses/Orbits: Unremarkable orbits. Left larger than right maxillary sinus mucous retention cysts. Mild bilateral ethmoid air cell mucosal thickening. Clear mastoid air cells. Other: None. IMPRESSION: 1. No evidence of intracranial metastases. 2. Moderately advanced brain parenchymal volume loss. Electronically Signed   By: ALogan BoresM.D.   On: 03/23/2018 14:49   Nm Pet Image Initial (pi) Skull Base To Thigh  Result Date:  03/10/2018 CLINICAL DATA:  Initial treatment strategy for pulmonary mass. EXAM: NUCLEAR MEDICINE PET SKULL BASE TO THIGH TECHNIQUE: 10.0 mCi F-18 FDG was injected intravenously. Full-ring PET imaging was performed from the skull base to thigh after the radiotracer. CT data was obtained and used for attenuation correction and anatomic localization. Fasting blood glucose: 104 mg/dl COMPARISON:  No recent chest CT available. FINDINGS: Mediastinal blood pool activity: SUV max 2.29 NECK: No hypermetabolic lymph nodes in the neck. Incidental CT findings: none CHEST: Large central RIGHT hilar mass is intensely hypermetabolic with SUV max equal 15.3. Mass is difficult to define on the noncontrast CT but measures approximately 5.6 x 4.6 on the PET imaging data set. Mass obliterates the RIGHT upper lobe bronchus and surrounds the bronchus intermedius. Postobstructive collapse of the RIGHT upper lobe. There are no hypermetabolic mediastinal nodes. Nodes supraclavicular hypermetabolic nodes. Incidental CT findings: none ABDOMEN/PELVIS: No abnormal hypermetabolic activity within the liver, pancreas, adrenal glands, or spleen. No hypermetabolic lymph nodes in the abdomen or pelvis. Incidental CT findings: Atherosclerotic calcification aorta. Mild dilatation at 3.5 cm of the infrarenal abdominal aorta. Midline ventral hernia contains nonobstructed loop of small bowel. Small LEFT bladder diverticulum. SKELETON: No focal hypermetabolic activity to suggest skeletal metastasis. Incidental CT findings: none IMPRESSION: 1. Large intensely hypermetabolic RIGHT hilar mass obstructs the RIGHT upper lobe bronchus and surrounds the bronchus intermedius. 2. Post obstructive collapse of the RIGHT upper lobe. 3. No hypermetabolic mediastinal or supraclavicular lymph nodes. 4. No evidence distant metastatic disease. Electronically Signed   By: SHelane GuntherD.  On: 03/10/2018 14:04   Dg Chest Port 1 View  Result Date:  03/22/2018 CLINICAL DATA:  Acute respiratory failure. EXAM: PORTABLE CHEST 1 VIEW COMPARISON:  03/21/2018 FINDINGS: The cardiomediastinal silhouette is unchanged. There is persistent cut off of the right mainstem bronchus by the known obstructing right hilar mass demonstrated on prior PET-CT. Right hemithorax volume loss is again noted with rightward mediastinal shift and dense right upper lung opacity corresponding to upper lobe collapse and consolidation. The right lower lung and left lung remain clear. No sizable pleural effusion or pneumothorax is identified. IMPRESSION: Unchanged postobstructive right upper lobe collapse. Electronically Signed   By: Logan Bores M.D.   On: 03/22/2018 08:31   Dg Chest Portable 1 View  Result Date: 03/21/2018 CLINICAL DATA:  Status post bronchoscopy. EXAM: PORTABLE CHEST 1 VIEW COMPARISON:  PET scan of March 10, 2018.  Radiograph January 12, 2016. FINDINGS: Stable cardiac size. Left lung is clear. No pneumothorax is noted. Large right upper lobe airspace opacity is noted concerning for postobstructive atelectasis as noted on prior PET scan with underlying mass. Bony thorax is unremarkable. IMPRESSION: Large right upper lobe airspace opacity is noted consistent with postobstructive atelectasis secondary to central obstructive mass as noted on prior PET scan. No pneumothorax is noted status post bronchoscopy. Electronically Signed   By: Marijo Conception, M.D.   On: 03/21/2018 15:12   Dg C-arm Bronchoscopy  Result Date: 03/21/2018 C-ARM BRONCHOSCOPY: Fluoroscopy was utilized by the requesting physician.  No radiographic interpretation.    ASSESSMENT & PLAN:   73 y.o.  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 Post obstructive collapse of the Right upper lobe. No hypermetabolic mediastinal or supraclavicular lymph nodes. No evidence of distant mets.   2) RUL airway obstruction with RUL lung collapse. 3) Concern for severe narrowing of the right main pulmonary artery as well as right upper lobe and right middle lobe arteries.   PLAN -Discussed pt labwork today, 04/07/18; HGB at 10.2, ANC at 9.3k, Albumin decreased to 2.4 -Discussed and emphasized the importance of his nutritional status. Recommend eating 3 meals a day and 2 boost/ensure additionally.  -Would like to switch pt from Metformin given weight loss and appetite loss-  recommend this to PCP Dr Bernerd Limbo -I recommended that the pt stop his night time dose of Metformin 571m, while tracking his blood sugars at home. Pt will let myself or Dr BColetta Memosknow if his blood sugars become uncontrolled into 300s.  -Discussed that the pt will let me know if he would like to have a port placed -Have ordered anti-nausea meds -The pt has no contraindications from beginning C1 of Carboplatin/Taxol at this time.   -additional questions about the chemotherapy were answered. -Will see pt back in 7 days for toxicity check -Drink at least 48-64 oz of water each day -Recommend pt walk 20-30 minutes each day, using his cane -Offered a referral to our outpatient PT - pt and his partner would like to wait on this for now    Please schedule next 4 doses of weekly carbo/taxol chemotherapy Labs weekly x 4 RTC with Dr KIrene Limboin 1 week with next chemotherapy with labs  for toxicity check    All of the patients questions were answered with apparent satisfaction. The patient knows to call the clinic with any problems, questions or concerns.  The total time spent in the appt was 25 minutes and more than 50% was on counseling and direct patient  cares.      Sullivan Lone MD MS AAHIVMS [facility] Hematology/Oncology Physician [facility]  (Office):       [phone] (Work cell):  [phone] (Fax):           [phone]  04/07/2018 11:01 AM  I, Baldwin [place], am acting as a Education administrator for Dr Irene Limbo.   .I have reviewed the above documentation for accuracy and completeness, and I agree with the above. Brunetta Genera MD

## 2018-04-07 NOTE — Patient Instructions (Signed)
[facility] Discharge Instructions for Patients Receiving Chemotherapy  Today you received the following chemotherapy agents: Paclitaxel (Taxol) and Carboplatin (Paraplatin)  To help prevent nausea and vomiting after your treatment, we encourage you to take your nausea medication as prescribed. Received Aloxi during treatment today-->Take Compazine (not Zofran) for the next 3 days as needed.    If you develop nausea and vomiting that is not controlled by your nausea medication, call the clinic.   BELOW ARE SYMPTOMS THAT SHOULD BE REPORTED IMMEDIATELY:  *FEVER GREATER THAN 100.5 F  *CHILLS WITH OR WITHOUT FEVER  NAUSEA AND VOMITING THAT IS NOT CONTROLLED WITH YOUR NAUSEA MEDICATION  *UNUSUAL SHORTNESS OF BREATH  *UNUSUAL BRUISING OR BLEEDING  TENDERNESS IN MOUTH AND THROAT WITH OR WITHOUT PRESENCE OF ULCERS  *URINARY PROBLEMS  *BOWEL PROBLEMS  UNUSUAL RASH Items with * indicate a potential emergency and should be followed up as soon as possible.  Feel free to call the clinic should you have any questions or concerns. The clinic phone number is [phone].  Please show the [facility] at check-in to the Emergency Department and triage nurse.  Paclitaxel injection What is this medicine? PACLITAXEL (PAK li TAX el) is a chemotherapy drug. It targets fast dividing cells, like cancer cells, and causes these cells to die. This medicine is used to treat ovarian cancer, breast cancer, and other cancers. This medicine may be used for other purposes; ask your health care provider or pharmacist if you have questions. COMMON BRAND NAME(S): Onxol, Taxol What should I tell my health care provider before I take this medicine? They need to know if you have any of these conditions: -blood disorders -irregular heartbeat -infection (especially a virus infection such as chickenpox, cold sores, or herpes) -liver disease -previous or ongoing radiation therapy -an unusual  or allergic reaction to paclitaxel, alcohol, polyoxyethylated castor oil, other chemotherapy agents, other medicines, foods, dyes, or preservatives -pregnant or trying to get pregnant -breast-feeding How should I use this medicine? This drug is given as an infusion into a vein. It is administered in a hospital or clinic by a specially trained health care professional. Talk to your pediatrician regarding the use of this medicine in children. Special care may be needed. Overdosage: If you think you have taken too much of this medicine contact a poison control center or emergency room at once. NOTE: This medicine is only for you. Do not share this medicine with others. What if I miss a dose? It is important not to miss your dose. Call your doctor or health care professional if you are unable to keep an appointment. What may interact with this medicine? Do not take this medicine with any of the following medications: -disulfiram -metronidazole This medicine may also interact with the following medications: -cyclosporine -diazepam -ketoconazole -medicines to increase blood counts like filgrastim, pegfilgrastim, sargramostim -other chemotherapy drugs like cisplatin, doxorubicin, epirubicin, etoposide, teniposide, vincristine -quinidine -testosterone -vaccines -verapamil Talk to your doctor or health care professional before taking any of these medicines: -acetaminophen -aspirin -ibuprofen -ketoprofen -naproxen This list may not describe all possible interactions. Give your health care provider a list of all the medicines, herbs, non-prescription drugs, or dietary supplements you use. Also tell them if you smoke, drink alcohol, or use illegal drugs. Some items may interact with your medicine. What should I watch for while using this medicine? Your condition will be monitored carefully while you are receiving this medicine. You will need important blood work done while you  are taking this  medicine. This medicine can cause serious allergic reactions. To reduce your risk you will need to take other medicine(s) before treatment with this medicine. If you experience allergic reactions like skin rash, itching or hives, swelling of the face, lips, or tongue, tell your doctor or health care professional right away. In some cases, you may be given additional medicines to help with side effects. Follow all directions for their use. This drug may make you feel generally unwell. This is not uncommon, as chemotherapy can affect healthy cells as well as cancer cells. Report any side effects. Continue your course of treatment even though you feel ill unless your doctor tells you to stop. Call your doctor or health care professional for advice if you get a fever, chills or sore throat, or other symptoms of a cold or flu. Do not treat yourself. This drug decreases your body's ability to fight infections. Try to avoid being around people who are sick. This medicine may increase your risk to bruise or bleed. Call your doctor or health care professional if you notice any unusual bleeding. Be careful brushing and flossing your teeth or using a toothpick because you may get an infection or bleed more easily. If you have any dental work done, tell your dentist you are receiving this medicine. Avoid taking products that contain aspirin, acetaminophen, ibuprofen, naproxen, or ketoprofen unless instructed by your doctor. These medicines may hide a fever. Do not become pregnant while taking this medicine. Women should inform their doctor if they wish to become pregnant or think they might be pregnant. There is a potential for serious side effects to an unborn child. Talk to your health care professional or pharmacist for more information. Do not breast-feed an infant while taking this medicine. Men are advised not to father a child while receiving this medicine. This product may contain alcohol. Ask your pharmacist  or healthcare provider if this medicine contains alcohol. Be sure to tell all healthcare providers you are taking this medicine. Certain medicines, like metronidazole and disulfiram, can cause an unpleasant reaction when taken with alcohol. The reaction includes flushing, headache, nausea, vomiting, sweating, and increased thirst. The reaction can last from 30 minutes to several hours. What side effects may I notice from receiving this medicine? Side effects that you should report to your doctor or health care professional as soon as possible: -allergic reactions like skin rash, itching or hives, swelling of the face, lips, or tongue -low blood counts - This drug may decrease the number of white blood cells, red blood cells and platelets. You may be at increased risk for infections and bleeding. -signs of infection - fever or chills, cough, sore throat, pain or difficulty passing urine -signs of decreased platelets or bleeding - bruising, pinpoint red spots on the skin, black, tarry stools, nosebleeds -signs of decreased red blood cells - unusually weak or tired, fainting spells, lightheadedness -breathing problems -chest pain -high or low blood pressure -mouth sores -nausea and vomiting -pain, swelling, redness or irritation at the injection site -pain, tingling, numbness in the hands or feet -slow or irregular heartbeat -swelling of the ankle, feet, hands Side effects that usually do not require medical attention (report to your doctor or health care professional if they continue or are bothersome): -bone pain -complete hair loss including hair on your head, underarms, pubic hair, eyebrows, and eyelashes -changes in the color of fingernails -diarrhea -loosening of the fingernails -loss of appetite -muscle or joint pain -red flush  to skin -sweating This list may not describe all possible side effects. Call your doctor for medical advice about side effects. You may report side effects to  FDA at 1-800-FDA-1088. Where should I keep my medicine? This drug is given in a hospital or clinic and will not be stored at home. NOTE: This sheet is a summary. It may not cover all possible information. If you have questions about this medicine, talk to your doctor, pharmacist, or health care provider.  2018 Elsevier/Gold Standard (2015-07-19 19:58:00)  Carboplatin injection What is this medicine? CARBOPLATIN (KAR boe pla tin) is a chemotherapy drug. It targets fast dividing cells, like cancer cells, and causes these cells to die. This medicine is used to treat ovarian cancer and many other cancers. This medicine may be used for other purposes; ask your health care provider or pharmacist if you have questions. COMMON BRAND NAME(S): Paraplatin What should I tell my health care provider before I take this medicine? They need to know if you have any of these conditions: -blood disorders -hearing problems -kidney disease -recent or ongoing radiation therapy -an unusual or allergic reaction to carboplatin, cisplatin, other chemotherapy, other medicines, foods, dyes, or preservatives -pregnant or trying to get pregnant -breast-feeding How should I use this medicine? This drug is usually given as an infusion into a vein. It is administered in a hospital or clinic by a specially trained health care professional. Talk to your pediatrician regarding the use of this medicine in children. Special care may be needed. Overdosage: If you think you have taken too much of this medicine contact a poison control center or emergency room at once. NOTE: This medicine is only for you. Do not share this medicine with others. What if I miss a dose? It is important not to miss a dose. Call your doctor or health care professional if you are unable to keep an appointment. What may interact with this medicine? -medicines for seizures -medicines to increase blood counts like filgrastim, pegfilgrastim,  sargramostim -some antibiotics like amikacin, gentamicin, neomycin, streptomycin, tobramycin -vaccines Talk to your doctor or health care professional before taking any of these medicines: -acetaminophen -aspirin -ibuprofen -ketoprofen -naproxen This list may not describe all possible interactions. Give your health care provider a list of all the medicines, herbs, non-prescription drugs, or dietary supplements you use. Also tell them if you smoke, drink alcohol, or use illegal drugs. Some items may interact with your medicine. What should I watch for while using this medicine? Your condition will be monitored carefully while you are receiving this medicine. You will need important blood work done while you are taking this medicine. This drug may make you feel generally unwell. This is not uncommon, as chemotherapy can affect healthy cells as well as cancer cells. Report any side effects. Continue your course of treatment even though you feel ill unless your doctor tells you to stop. In some cases, you may be given additional medicines to help with side effects. Follow all directions for their use. Call your doctor or health care professional for advice if you get a fever, chills or sore throat, or other symptoms of a cold or flu. Do not treat yourself. This drug decreases your body's ability to fight infections. Try to avoid being around people who are sick. This medicine may increase your risk to bruise or bleed. Call your doctor or health care professional if you notice any unusual bleeding. Be careful brushing and flossing your teeth or using a toothpick because you  may get an infection or bleed more easily. If you have any dental work done, tell your dentist you are receiving this medicine. Avoid taking products that contain aspirin, acetaminophen, ibuprofen, naproxen, or ketoprofen unless instructed by your doctor. These medicines may hide a fever. Do not become pregnant while taking this  medicine. Women should inform their doctor if they wish to become pregnant or think they might be pregnant. There is a potential for serious side effects to an unborn child. Talk to your health care professional or pharmacist for more information. Do not breast-feed an infant while taking this medicine. What side effects may I notice from receiving this medicine? Side effects that you should report to your doctor or health care professional as soon as possible: -allergic reactions like skin rash, itching or hives, swelling of the face, lips, or tongue -signs of infection - fever or chills, cough, sore throat, pain or difficulty passing urine -signs of decreased platelets or bleeding - bruising, pinpoint red spots on the skin, black, tarry stools, nosebleeds -signs of decreased red blood cells - unusually weak or tired, fainting spells, lightheadedness -breathing problems -changes in hearing -changes in vision -chest pain -high blood pressure -low blood counts - This drug may decrease the number of white blood cells, red blood cells and platelets. You may be at increased risk for infections and bleeding. -nausea and vomiting -pain, swelling, redness or irritation at the injection site -pain, tingling, numbness in the hands or feet -problems with balance, talking, walking -trouble passing urine or change in the amount of urine Side effects that usually do not require medical attention (report to your doctor or health care professional if they continue or are bothersome): -hair loss -loss of appetite -metallic taste in the mouth or changes in taste This list may not describe all possible side effects. Call your doctor for medical advice about side effects. You may report side effects to FDA at 1-800-FDA-1088. Where should I keep my medicine? This drug is given in a hospital or clinic and will not be stored at home. NOTE: This sheet is a summary. It may not cover all possible information. If you  have questions about this medicine, talk to your doctor, pharmacist, or health care provider.  2018 Elsevier/Gold Standard (2007-12-23 14:38:05)

## 2018-04-07 NOTE — Progress Notes (Signed)
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  HPI: 72 year old with COPD, hypertension, hyperlipidemia, diastolic heart failure He had a chest x-ray which showed right upper lobe collapse primary care.  A follow-up CT showed mediastinal mass with right upper lobe bronchial narrowing and collapse and has been referred to pulmonary for further evaluation Chief complaint is cough, mucus production, dyspnea with activity and at rest.  He reports ongoing weakness, loss of weight and appetite for the past few months Denies any hemoptysis, fevers, chills.  Pets:No pets Occupation: Retired Games developer Exposures: No known exposures, no mold, hot tub Smoking history: 50-pack-year smoking history.  Quit in October 2018 Travel history: No significant travel Relevant family history: Father died of lung cancer.  Interim history: Underwent bronchoscopy on 6/21 for evaluation of right hilar mass with results showing squamous cell cancer.  Admitted for couple of days due to desats postprocedure He has started radiation therapy and is currently on chemotherapy, follows with Dr. Irene Limbo. States that breathing is at baseline, has cough with clear mucus.  Denies any wheezing, fevers, chills, hemoptysis.  Outpatient Encounter Medications as of 04/07/2018  Medication Sig  . aspirin 81 MG tablet Take 81 mg by mouth daily.  [name] atorvastatin (LIPITOR) 40 MG tablet Take 40 mg by mouth daily.   . benazepril (LOTENSIN) 20 MG tablet Take 10 mg by mouth daily.   . folic acid (FOLVITE) 384 MCG tablet Take 400 mcg by mouth daily.  . furosemide (LASIX) 40 MG tablet Take 20 mg by mouth 2 (two) times daily.   . hydrALAZINE (APRESOLINE) 10 MG tablet Take 10 mg by mouth 2  (two) times daily.   . Ipratropium-Albuterol (COMBIVENT IN) Inhale 2 puffs into the lungs every 6 (six) hours as needed (for shortness of breath/wheezing.).  [name] metFORMIN (GLUCOPHAGE) 500 MG tablet Take 500 mg by mouth 2 (two) times daily with a meal.   . potassium chloride SA (K-DUR,KLOR-CON) 20 MEQ tablet Take 20 mEq by mouth daily.   [name] umeclidinium-vilanterol (ANORO ELLIPTA) 62.5-25 MCG/INH AEPB Inhale 1 puff into the lungs daily.   . [DISCONTINUED] ALPRAZolam (XANAX) 0.25 MG tablet Take 1 tablet (0.25 mg total) by mouth at bedtime as needed for anxiety. Take one table 4hr prior to scan and another 2mn prior to scan if needed.  . umeclidinium-vilanterol (ANORO ELLIPTA) 62.5-25 MCG/INH AEPB Inhale 1 puff into the lungs daily for 1 day.   No facility-administered encounter medications on file as of 04/07/2018.     Allergies as of 04/07/2018 - Review Complete 04/07/2018  Allergen Reaction Noted  . Neomycin-bacitracin zn-polymyx Rash 11/05/2006    Past Medical History:  Diagnosis Date  . Alcohol abuse   . COPD ([place]ronic obstructive pulmonary disease) ([facility]   . [place] complication [place]   . Gout   . High cholesterol   . Hypertension   . Malignant neoplasm of right upper lobe of lung ([facility] 03/29/2018    Past Surgical History:  Procedure Laterality Date  . COLONOSCOPY    . FLEXIBLE BRONCHOSCOPY [name] 03/21/2018   Procedure: FLEXIBLE BRONCHOSCOPY;  Surgeon: MMarshell Garfinkel MD;  [facility]  Service: Pulmonary;  Laterality: [name];  . NOSE SURGERY  90's  .  VIDEO BRONCHOSCOPY WITH ENDOBRONCHIAL ULTRASOUND [name] 03/21/2018   Procedure: VIDEO BRONCHOSCOPY WITH ENDOBRONCHIAL ULTRASOUND;  Surgeon: Marshell Garfinkel, MD;  Location: [facility];  Service: Pulmonary;  Laterality: [name];    Family History  Problem Relation Age of Onset  . Arthritis Mother   . Cancer Father   . Cancer Sister   . Heart disease Maternal Uncle   . Colon cancer Neg Hx     Social History   Socioeconomic  History  . Marital status: Married    Spouse name: Not on file  . Number of children: Not on file  . Years of education: Not on file  . Highest education level: Not on file  Occupational History  . Not on file  Social Needs  . Financial resource strain: Not on file  . Food insecurity:    Worry: Not on file    Inability: Not on file  . Transportation needs:    Medical: Not on file    Non-medical: Not on file  Tobacco Use  . Smoking status: Former Smoker    Packs/day: 0.25    Years: 50.00    Pack years: 12.50    Types: Cigarettes    Last attempt to quit: 07/2017    Years since quitting: 0.7  . Smokeless tobacco: Never Used  . Tobacco comment: quit smoking 07/2017  Substance and Sexual Activity  . Alcohol use: Yes    Alcohol/week: 8.4 - 12.6 oz    Types: 14 - 21 Cans of beer per week  . Drug use: No  . Sexual activity: Not on file  Lifestyle  . Physical activity:    Days per week: Not on file    Minutes per session: Not on file  . Stress: Not on file  Relationships  . Social connections:    Talks on phone: Not on file    Gets together: Not on file    Attends religious service: Not on file    Active member of club or organization: Not on file    Attends meetings of clubs or organizations: Not on file    Relationship status: Not on file  . Intimate partner violence:    Fear of current or ex partner: Not on file    Emotionally abused: Not on file    Physically abused: Not on file    Forced sexual activity: Not on file  Other Topics Concern  . Not on file  Social History Narrative  . Not on file    Review of systems: Review of Systems  Constitutional: Negative for fever and chills.  HENT: Negative.   Eyes: Negative for blurred vision.  Respiratory: as per HPI  Cardiovascular: Negative for chest pain and palpitations.  Gastrointestinal: Negative for vomiting, diarrhea, blood per rectum. Genitourinary: Negative for dysuria, urgency, frequency and hematuria.    Musculoskeletal: Negative for myalgias, back pain and joint pain.  Skin: Negative for itching and rash.  Neurological: Negative for dizziness, tremors, focal weakness, seizures and loss of consciousness.  Endo/Heme/Allergies: Negative for environmental allergies.  Psychiatric/Behavioral: Negative for depression, suicidal ideas and hallucinations.  All other systems reviewed and are negative.  Physical Exam: Blood pressure 118/70, pulse 93, height _0  (1.778 m), weight 204 lb 12.8 oz (92.9 kg), SpO2 94 %. Gen:      No acute distress HEENT:  EOMI, sclera anicteric Neck:     No masses; no thyromegaly Lungs:    Clear to auscultation bilaterally; normal respiratory effort CV:  Regular rate and rhythm; no murmurs Abd:      + bowel sounds; soft, non-tender; no palpable masses, no distension Ext:    No edema; adequate peripheral perfusion Skin:      Warm and dry; no rash Neuro: alert and oriented x 3 Psych: normal mood and affect  Data Reviewed: CT chest 02/27/2018 5.8 x 4.9 cm mass in the right mediastinum and hilum with associated severe narrowing of the right main pulmonary artery as well as right upper lobe and right middle lobe arteries. Obstructed right main stem bronchus and collapse of the right  upper lobe and right middle lobe. Small right effusion. The appearance is similar to the patient's chest radiograph from 02/20/2018.  PET scan 0/80/2233- hypermetabolic right hilar mass with obstruction of the right upper lobe bronchus.  Postobstructive collapse of right upper lobe. I have reviewed the images personally.  MRI brain 03/23/2018-no evidence of metastasis.  Metabolic profile 03/12/2448-PNPYYF normal limits except for glucose of 155 CBC 02/20/2018-WBC 14.2, hemoglobin 12.1, hematocrit 37, platelets 344  PFTs 03/14/1989 FVC 1.74 [39%], FEV1 1.15 [35%], F/F 66, TLC 82%, DLCO 40% Severe obstruction, severe diffusion defect  Assessment:  Stage IIIa squamous lung  cancer Undergoing chemotherapy and radiation  Severe COPD Stable on anoro.  Continue the same Monitor oxygen levels on exertion.  Health maintenance 08/06/2017-influenza 07/10/2013-Prevnar 07/19/2015-Pneumovax  Plan/Recommendations: -Continue Anoro  Follow-up in 6 months  Marshell Garfinkel MD [facility] 04/07/2018, 9:52 AM  CC: Bernerd Limbo, MD

## 2018-04-08 ENCOUNTER — Ambulatory Visit
Admission: RE | Admit: 2018-04-08 | Discharge: 2018-04-08 | Disposition: A | Discharge status: Home / Self Care | Payer: Medicare HMO | Source: Ambulatory Visit | Attending: Radiation Oncology | Admitting: Radiation Oncology

## 2018-04-08 ENCOUNTER — Telehealth: Payer: Self-pay

## 2018-04-08 DIAGNOSIS — Z510 Encounter for antineoplastic radiation therapy: Secondary | ICD-10-CM | POA: Diagnosis not present

## 2018-04-08 NOTE — Telephone Encounter (Signed)
Follow-up chemo call. Patient states he is doing well and feels ok. No complaints. Patient instructed to call the office with any questions or concerns.

## 2018-04-09 ENCOUNTER — Ambulatory Visit
Admission: RE | Admit: 2018-04-09 | Discharge: 2018-04-09 | Disposition: A | Discharge status: Home / Self Care | Payer: Medicare HMO | Source: Ambulatory Visit | Attending: Radiation Oncology | Admitting: Radiation Oncology

## 2018-04-09 DIAGNOSIS — Z510 Encounter for antineoplastic radiation therapy: Secondary | ICD-10-CM | POA: Diagnosis not present

## 2018-04-10 ENCOUNTER — Ambulatory Visit
Admission: RE | Admit: 2018-04-10 | Discharge: 2018-04-10 | Disposition: A | Discharge status: Home / Self Care | Payer: Medicare HMO | Source: Ambulatory Visit | Attending: Radiation Oncology | Admitting: Radiation Oncology

## 2018-04-10 DIAGNOSIS — Z510 Encounter for antineoplastic radiation therapy: Secondary | ICD-10-CM | POA: Diagnosis not present

## 2018-04-11 ENCOUNTER — Inpatient Hospital Stay: Payer: Medicare HMO

## 2018-04-11 ENCOUNTER — Telehealth: Payer: Self-pay | Admitting: Hematology

## 2018-04-11 ENCOUNTER — Ambulatory Visit
Admission: RE | Admit: 2018-04-11 | Discharge: 2018-04-11 | Disposition: A | Discharge status: Home / Self Care | Payer: Medicare HMO | Source: Ambulatory Visit | Attending: Radiation Oncology | Admitting: Radiation Oncology

## 2018-04-11 ENCOUNTER — Telehealth: Payer: Self-pay

## 2018-04-11 ENCOUNTER — Inpatient Hospital Stay: Payer: Medicare HMO | Admitting: Hematology

## 2018-04-11 ENCOUNTER — Other Ambulatory Visit: Payer: Self-pay | Admitting: Hematology

## 2018-04-11 DIAGNOSIS — Z510 Encounter for antineoplastic radiation therapy: Secondary | ICD-10-CM | POA: Diagnosis not present

## 2018-04-11 NOTE — Telephone Encounter (Signed)
Left a voice message just to reconfirm appointment that was scheduled for the patient.per 7/10 los

## 2018-04-11 NOTE — Telephone Encounter (Signed)
Schedule printed  Pt request 7/12

## 2018-04-14 ENCOUNTER — Inpatient Hospital Stay: Payer: Medicare HMO

## 2018-04-14 ENCOUNTER — Telehealth: Payer: Self-pay | Admitting: Hematology

## 2018-04-14 ENCOUNTER — Ambulatory Visit
Admission: RE | Admit: 2018-04-14 | Discharge: 2018-04-14 | Disposition: A | Discharge status: Home / Self Care | Payer: Medicare HMO | Source: Ambulatory Visit | Attending: Radiation Oncology | Admitting: Radiation Oncology

## 2018-04-14 ENCOUNTER — Inpatient Hospital Stay (HOSPITAL_BASED_OUTPATIENT_CLINIC_OR_DEPARTMENT_OTHER): Payer: Medicare HMO | Admitting: Hematology

## 2018-04-14 VITALS — BP 88/63 | HR 96 | Temp 98.1°F | Resp 18 | Ht 70.0 in | Wt 194.5 lb

## 2018-04-14 DIAGNOSIS — E119 Type 2 diabetes mellitus without complications: Secondary | ICD-10-CM

## 2018-04-14 DIAGNOSIS — R634 Abnormal weight loss: Secondary | ICD-10-CM

## 2018-04-14 DIAGNOSIS — C3491 Malignant neoplasm of unspecified part of right bronchus or lung: Secondary | ICD-10-CM

## 2018-04-14 DIAGNOSIS — R630 Anorexia: Secondary | ICD-10-CM

## 2018-04-14 DIAGNOSIS — J449 Chronic obstructive pulmonary disease, unspecified: Secondary | ICD-10-CM

## 2018-04-14 DIAGNOSIS — I10 Essential (primary) hypertension: Secondary | ICD-10-CM | POA: Diagnosis not present

## 2018-04-14 DIAGNOSIS — C3401 Malignant neoplasm of right main bronchus: Secondary | ICD-10-CM

## 2018-04-14 DIAGNOSIS — Z5111 Encounter for antineoplastic chemotherapy: Secondary | ICD-10-CM | POA: Diagnosis not present

## 2018-04-14 DIAGNOSIS — Z7189 Other specified counseling: Secondary | ICD-10-CM

## 2018-04-14 DIAGNOSIS — J988 Other specified respiratory disorders: Secondary | ICD-10-CM

## 2018-04-14 DIAGNOSIS — E860 Dehydration: Secondary | ICD-10-CM

## 2018-04-14 DIAGNOSIS — Z510 Encounter for antineoplastic radiation therapy: Secondary | ICD-10-CM | POA: Diagnosis not present

## 2018-04-14 DIAGNOSIS — E440 Moderate protein-calorie malnutrition: Secondary | ICD-10-CM

## 2018-04-14 LAB — CMP (CANCER CENTER ONLY)
ALBUMIN: 2.7 g/dL — AB (ref 3.5–5.0)
ALT: 13 U/L (ref 0–44)
ANION GAP: 10 (ref 5–15)
AST: 13 U/L — AB (ref 15–41)
Alkaline Phosphatase: 57 U/L (ref 38–126)
BUN: 16 mg/dL (ref 8–23)
CHLORIDE: 103 mmol/L (ref 98–111)
CO2: 25 mmol/L (ref 22–32)
Calcium: 9.7 mg/dL (ref 8.9–10.3)
Creatinine: 1 mg/dL (ref 0.61–1.24)
GFR, Est AFR Am: >60 mL/min (ref >60)
GFR, Estimated: >60 mL/min (ref >60)
GLUCOSE: 155 mg/dL — AB (ref 70–99)
POTASSIUM: 4.4 mmol/L (ref 3.5–5.1)
SODIUM: 138 mmol/L (ref 135–145)
TOTAL PROTEIN: 7.8 g/dL (ref 6.5–8.1)
Total Bilirubin: 0.5 mg/dL (ref 0.3–1.2)

## 2018-04-14 LAB — MAGNESIUM: Magnesium: 2 mg/dL (ref 1.7–2.4)

## 2018-04-14 LAB — CBC WITH DIFFERENTIAL/PLATELET
BASOS ABS: 0.1 10*3/uL (ref 0.0–0.1)
BASOS PCT: 1 %
EOS ABS: 0.1 10*3/uL (ref 0.0–0.5)
Eosinophils Relative: 1 %
HEMATOCRIT: 35 % — AB (ref 38.4–49.9)
Hemoglobin: 11.5 g/dL — ABNORMAL LOW (ref 13.0–17.1)
Lymphocytes Relative: 4 %
Lymphs Abs: 0.5 10*3/uL — ABNORMAL LOW (ref 0.9–3.3)
MCH: 27.6 pg (ref 27.2–33.4)
MCHC: 32.8 g/dL (ref 32.0–36.0)
MCV: 84.1 fL (ref 79.3–98.0)
MONO ABS: 0.5 10*3/uL (ref 0.1–0.9)
MONOS PCT: 4 %
NEUTROS ABS: 9.4 10*3/uL — AB (ref 1.5–6.5)
NEUTROS PCT: 90 %
Platelets: 259 10*3/uL (ref 140–400)
RBC: 4.16 MIL/uL — ABNORMAL LOW (ref 4.20–5.82)
RDW: 15 % — ABNORMAL HIGH (ref 11.0–14.6)
WBC: 10.5 10*3/uL — ABNORMAL HIGH (ref 4.0–10.3)

## 2018-04-14 LAB — PHOSPHORUS: Phosphorus: 3.2 mg/dL (ref 2.5–4.6)

## 2018-04-14 MED ORDER — DIPHENHYDRAMINE HCL 50 MG/ML IJ SOLN
INTRAMUSCULAR | Status: AC
  Filled 2018-04-14: qty 1

## 2018-04-14 MED ORDER — SODIUM CHLORIDE 0.9 % IV SOLN
45.0000 mg/m2 | Freq: Once | INTRAVENOUS | Status: AC
  Administered 2018-04-14: 96 mg via INTRAVENOUS
  Filled 2018-04-14: qty 16

## 2018-04-14 MED ORDER — DEXAMETHASONE SODIUM PHOSPHATE 100 MG/10ML IJ SOLN
20.0000 mg | Freq: Once | INTRAMUSCULAR | Status: AC
  Administered 2018-04-14: 20 mg via INTRAVENOUS
  Filled 2018-04-14: qty 2

## 2018-04-14 MED ORDER — FAMOTIDINE IN NACL 20-0.9 MG/50ML-% IV SOLN
20.0000 mg | Freq: Once | INTRAVENOUS | Status: AC
  Administered 2018-04-14: 20 mg via INTRAVENOUS

## 2018-04-14 MED ORDER — PALONOSETRON HCL INJECTION 0.25 MG/5ML
INTRAVENOUS | Status: AC
  Filled 2018-04-14: qty 5

## 2018-04-14 MED ORDER — DIPHENHYDRAMINE HCL 50 MG/ML IJ SOLN
50.0000 mg | Freq: Once | INTRAMUSCULAR | Status: AC
  Administered 2018-04-14: 50 mg via INTRAVENOUS

## 2018-04-14 MED ORDER — PALONOSETRON HCL INJECTION 0.25 MG/5ML
0.2500 mg | Freq: Once | INTRAVENOUS | Status: AC
  Administered 2018-04-14: 0.25 mg via INTRAVENOUS

## 2018-04-14 MED ORDER — SODIUM CHLORIDE 0.9 % IV SOLN
1000.0000 mL | Freq: Once | INTRAVENOUS | Status: AC
  Administered 2018-04-14: 1000 mL via INTRAVENOUS

## 2018-04-14 MED ORDER — FAMOTIDINE IN NACL 20-0.9 MG/50ML-% IV SOLN
INTRAVENOUS | Status: AC
  Filled 2018-04-14: qty 50

## 2018-04-14 MED ORDER — SODIUM CHLORIDE 0.9 % IV SOLN
230.0000 mg | Freq: Once | INTRAVENOUS | Status: AC
  Administered 2018-04-14: 230 mg via INTRAVENOUS
  Filled 2018-04-14: qty 23

## 2018-04-14 MED ORDER — DRONABINOL 5 MG PO CAPS: 5.0000 mg | ORAL_CAPSULE | Freq: Two times a day (BID) | ORAL | 0 refills | Status: DC

## 2018-04-14 MED ORDER — SODIUM CHLORIDE 0.9 % IV SOLN: Freq: Once | INTRAVENOUS | Status: DC

## 2018-04-14 NOTE — Patient Instructions (Signed)
[facility] Instructions for Patients Receiving Chemotherapy  Today you received the following chemotherapy agents: Paclitaxel (Taxol) and Carboplatin (Paraplatin)  To help prevent nausea and vomiting after your treatment, we encourage you to take your nausea medication as prescribed. Received Aloxi during treatment today-->Take Compazine (not Zofran) for the next 3 days as needed.    If you develop nausea and vomiting that is not controlled by your nausea medication, call the clinic.   BELOW ARE SYMPTOMS THAT SHOULD BE REPORTED IMMEDIATELY:  *FEVER GREATER THAN 100.5 F  *CHILLS WITH OR WITHOUT FEVER  NAUSEA AND VOMITING THAT IS NOT CONTROLLED WITH YOUR NAUSEA MEDICATION  *UNUSUAL SHORTNESS OF BREATH  *UNUSUAL BRUISING OR BLEEDING  TENDERNESS IN MOUTH AND THROAT WITH OR WITHOUT PRESENCE OF ULCERS  *URINARY PROBLEMS  *BOWEL PROBLEMS  UNUSUAL RASH Items with * indicate a potential emergency and should be followed up as soon as possible.  Feel free to call the clinic should you have any questions or concerns. The clinic phone number is [phone].  Please show the [place] at check-in to the Emergency Department and triage nurse.  Paclitaxel injection What is this medicine? PACLITAXEL (PAK li TAX el) is a chemotherapy drug. It targets fast dividing cells, like cancer cells, and causes these cells to die. This medicine is used to treat ovarian cancer, breast cancer, and other cancers. This medicine may be used for other purposes; ask your health care provider or pharmacist if you have questions. COMMON BRAND NAME(S): Onxol, Taxol What should I tell my health care provider before I take this medicine? They need to know if you have any of these conditions: -blood disorders -irregular heartbeat -infection (especially a virus infection such as chickenpox, cold sores, or herpes) -liver disease -previous or ongoing radiation therapy -an unusual  or allergic reaction to paclitaxel, alcohol, polyoxyethylated castor oil, other chemotherapy agents, other medicines, foods, dyes, or preservatives -pregnant or trying to get pregnant -breast-feeding How should I use this medicine? This drug is given as an infusion into a vein. It is administered in a hospital or clinic by a specially trained health care professional. Talk to your pediatrician regarding the use of this medicine in children. Special care may be needed. Overdosage: If you think you have taken too much of this medicine contact a poison control center or emergency room at once. NOTE: This medicine is only for you. Do not share this medicine with others. What if I miss a dose? It is important not to miss your dose. Call your doctor or health care professional if you are unable to keep an appointment. What may interact with this medicine? Do not take this medicine with any of the following medications: -disulfiram -metronidazole This medicine may also interact with the following medications: -cyclosporine -diazepam -ketoconazole -medicines to increase blood counts like filgrastim, pegfilgrastim, sargramostim -other chemotherapy drugs like cisplatin, doxorubicin, epirubicin, etoposide, teniposide, vincristine -quinidine -testosterone -vaccines -verapamil Talk to your doctor or health care professional before taking any of these medicines: -acetaminophen -aspirin -ibuprofen -ketoprofen -naproxen This list may not describe all possible interactions. Give your health care provider a list of all the medicines, herbs, non-prescription drugs, or dietary supplements you use. Also tell them if you smoke, drink alcohol, or use illegal drugs. Some items may interact with your medicine. What should I watch for while using this medicine? Your condition will be monitored carefully while you are receiving this medicine. You will need important blood work done while you  are taking this  medicine. This medicine can cause serious allergic reactions. To reduce your risk you will need to take other medicine(s) before treatment with this medicine. If you experience allergic reactions like skin rash, itching or hives, swelling of the face, lips, or tongue, tell your doctor or health care professional right away. In some cases, you may be given additional medicines to help with side effects. Follow all directions for their use. This drug may make you feel generally unwell. This is not uncommon, as chemotherapy can affect healthy cells as well as cancer cells. Report any side effects. Continue your course of treatment even though you feel ill unless your doctor tells you to stop. Call your doctor or health care professional for advice if you get a fever, chills or sore throat, or other symptoms of a cold or flu. Do not treat yourself. This drug decreases your body's ability to fight infections. Try to avoid being around people who are sick. This medicine may increase your risk to bruise or bleed. Call your doctor or health care professional if you notice any unusual bleeding. Be careful brushing and flossing your teeth or using a toothpick because you may get an infection or bleed more easily. If you have any dental work done, tell your dentist you are receiving this medicine. Avoid taking products that contain aspirin, acetaminophen, ibuprofen, naproxen, or ketoprofen unless instructed by your doctor. These medicines may hide a fever. Do not become pregnant while taking this medicine. Women should inform their doctor if they wish to become pregnant or think they might be pregnant. There is a potential for serious side effects to an unborn child. Talk to your health care professional or pharmacist for more information. Do not breast-feed an infant while taking this medicine. Men are advised not to father a child while receiving this medicine. This product may contain alcohol. Ask your pharmacist  or healthcare provider if this medicine contains alcohol. Be sure to tell all healthcare providers you are taking this medicine. Certain medicines, like metronidazole and disulfiram, can cause an unpleasant reaction when taken with alcohol. The reaction includes flushing, headache, nausea, vomiting, sweating, and increased thirst. The reaction can last from 30 minutes to several hours. What side effects may I notice from receiving this medicine? Side effects that you should report to your doctor or health care professional as soon as possible: -allergic reactions like skin rash, itching or hives, swelling of the face, lips, or tongue -low blood counts - This drug may decrease the number of white blood cells, red blood cells and platelets. You may be at increased risk for infections and bleeding. -signs of infection - fever or chills, cough, sore throat, pain or difficulty passing urine -signs of decreased platelets or bleeding - bruising, pinpoint red spots on the skin, black, tarry stools, nosebleeds -signs of decreased red blood cells - unusually weak or tired, fainting spells, lightheadedness -breathing problems -chest pain -high or low blood pressure -mouth sores -nausea and vomiting -pain, swelling, redness or irritation at the injection site -pain, tingling, numbness in the hands or feet -slow or irregular heartbeat -swelling of the ankle, feet, hands Side effects that usually do not require medical attention (report to your doctor or health care professional if they continue or are bothersome): -bone pain -complete hair loss including hair on your head, underarms, pubic hair, eyebrows, and eyelashes -changes in the color of fingernails -diarrhea -loosening of the fingernails -loss of appetite -muscle or joint pain -red flush  to skin -sweating This list may not describe all possible side effects. Call your doctor for medical advice about side effects. You may report side effects to  FDA at 1-800-FDA-1088. Where should I keep my medicine? This drug is given in a hospital or clinic and will not be stored at home. NOTE: This sheet is a summary. It may not cover all possible information. If you have questions about this medicine, talk to your doctor, pharmacist, or health care provider.  2018 [employer] Standard (2015-07-19 19:58:00)  Carboplatin injection What is this medicine? CARBOPLATIN (KAR boe pla tin) is a chemotherapy drug. It targets fast dividing cells, like cancer cells, and causes these cells to die. This medicine is used to treat ovarian cancer and many other cancers. This medicine may be used for other purposes; ask your health care provider or pharmacist if you have questions. COMMON BRAND NAME(S): Paraplatin What should I tell my health care provider before I take this medicine? They need to know if you have any of these conditions: -blood disorders -hearing problems -kidney disease -recent or ongoing radiation therapy -an unusual or allergic reaction to carboplatin, cisplatin, other chemotherapy, other medicines, foods, dyes, or preservatives -pregnant or trying to get pregnant -breast-feeding How should I use this medicine? This drug is usually given as an infusion into a vein. It is administered in a hospital or clinic by a specially trained health care professional. Talk to your pediatrician regarding the use of this medicine in children. Special care may be needed. Overdosage: If you think you have taken too much of this medicine contact a poison control center or emergency room at once. NOTE: This medicine is only for you. Do not share this medicine with others. What if I miss a dose? It is important not to miss a dose. Call your doctor or health care professional if you are unable to keep an appointment. What may interact with this medicine? -medicines for seizures -medicines to increase blood counts like filgrastim, pegfilgrastim,  sargramostim -some antibiotics like amikacin, gentamicin, neomycin, streptomycin, tobramycin -vaccines Talk to your doctor or health care professional before taking any of these medicines: -acetaminophen -aspirin -ibuprofen -ketoprofen -naproxen This list may not describe all possible interactions. Give your health care provider a list of all the medicines, herbs, non-prescription drugs, or dietary supplements you use. Also tell them if you smoke, drink alcohol, or use illegal drugs. Some items may interact with your medicine. What should I watch for while using this medicine? Your condition will be monitored carefully while you are receiving this medicine. You will need important blood work done while you are taking this medicine. This drug may make you feel generally unwell. This is not uncommon, as chemotherapy can affect healthy cells as well as cancer cells. Report any side effects. Continue your course of treatment even though you feel ill unless your doctor tells you to stop. In some cases, you may be given additional medicines to help with side effects. Follow all directions for their use. Call your doctor or health care professional for advice if you get a fever, chills or sore throat, or other symptoms of a cold or flu. Do not treat yourself. This drug decreases your body's ability to fight infections. Try to avoid being around people who are sick. This medicine may increase your risk to bruise or bleed. Call your doctor or health care professional if you notice any unusual bleeding. Be careful brushing and flossing your teeth or using a toothpick because you  may get an infection or bleed more easily. If you have any dental work done, tell your dentist you are receiving this medicine. Avoid taking products that contain aspirin, acetaminophen, ibuprofen, naproxen, or ketoprofen unless instructed by your doctor. These medicines may hide a fever. Do not become pregnant while taking this  medicine. Women should inform their doctor if they wish to become pregnant or think they might be pregnant. There is a potential for serious side effects to an unborn child. Talk to your health care professional or pharmacist for more information. Do not breast-feed an infant while taking this medicine. What side effects may I notice from receiving this medicine? Side effects that you should report to your doctor or health care professional as soon as possible: -allergic reactions like skin rash, itching or hives, swelling of the face, lips, or tongue -signs of infection - fever or chills, cough, sore throat, pain or difficulty passing urine -signs of decreased platelets or bleeding - bruising, pinpoint red spots on the skin, black, tarry stools, nosebleeds -signs of decreased red blood cells - unusually weak or tired, fainting spells, lightheadedness -breathing problems -changes in hearing -changes in vision -chest pain -high blood pressure -low blood counts - This drug may decrease the number of white blood cells, red blood cells and platelets. You may be at increased risk for infections and bleeding. -nausea and vomiting -pain, swelling, redness or irritation at the injection site -pain, tingling, numbness in the hands or feet -problems with balance, talking, walking -trouble passing urine or change in the amount of urine Side effects that usually do not require medical attention (report to your doctor or health care professional if they continue or are bothersome): -hair loss -loss of appetite -metallic taste in the mouth or changes in taste This list may not describe all possible side effects. Call your doctor for medical advice about side effects. You may report side effects to FDA at 1-800-FDA-1088. Where should I keep my medicine? This drug is given in a hospital or clinic and will not be stored at home. NOTE: This sheet is a summary. It may not cover all possible information. If you  have questions about this medicine, talk to your doctor, pharmacist, or health care provider.  2018 [employer] Standard (2007-12-23 14:38:05)

## 2018-04-14 NOTE — Progress Notes (Signed)
[name]    HEMATOLOGY/ONCOLOGY CONSULTATION NOTE  Date of Service: 04/14/2018  Patient Care Team: Bernerd Limbo, MD as PCP - General (Family Medicine)  CHIEF COMPLAINTS/PURPOSE OF CONSULTATION:  Mx of recently diagnosed lung cancer  HISTORY OF PRESENTING ILLNESS:   Barry Booker is a wonderful 72 y.o. male who has been referred to [place] by Dr Marshell Garfinkel for evaluation and management of presumed newly diagnosed rt sided lung cancer with concern for bronchial obstruction.  Patient has a history of hypertension, dyslipidemia, diabetes, COPD, alcohol abuse and previously had a chest x-ray on 02/20/2018 at [facility] which showed Right upper lobe consolidation/atelectasis may relate to post obstructive pneumonitis/pneumonia. An underlying obstructing lesion should be considered. Consider CT for further assessment.  H subsequently then had a CT of the chest on 02/27/2018 which showed -5.8 x 4.9 cm mass in the right mediastinum and hilum with associated severe narrowing of the right main pulmonary artery as well as right upper lobe and right middle lobe arteries. Obstructed right main stem bronchus and collapse of the right upper lobe and right middle lobe. Small right effusion. The appearance is similar to the patient's chest radiograph from 02/20/2018.  Patient subsequently had a PET CT on 03/10/2018 by Dr. Vaughan Browner which showed Large intensely hypermetabolic RIGHT hilar mass obstructs the RIGHT upper lobe bronchus and surrounds the bronchus intermedius. Post obstructive collapse of the RIGHT upper lobe.  No hypermetabolic mediastinal or supraclavicular lymph nodes. No evidence distant metastatic disease.  Patient had a flexible video fiberoptic bronchoscopy done on 03/21/2018 by Dr. Vaughan Browner which showed which showed some nodularity of the right mainstem bronchial mucosa with near complete occlusion of right main stem. Unable to traverse the occluded part of right main stem. The mucosa was friable with propensity  to bleed. Endobronchial biopsies and brushings were taken from the R mainstem and right upper lobe for pathology.  Preliminary results showed atypical cells however final cytology/pathology is currently pending.  Patient report rt sided chest pain with deep inspiration, anorexia and weight loss of about 30lbs in the last 6 months.  Interval History:   Barry Booker returns to clinic today for management and evaluation of his newly diagnosed squamous cell carcinoma. The patient's last visit with [place] was on 04/07/18. He is accompanied today by his partner. The pt reports that he is doing well overall and.   The pt reports that his appetite has continued to be weak. He has lost 10 pounds in the last week. He also notes that he hasn't been active, and has not walked much. He took 8m Dexamethasone over the last week, but he denies taking this every day.   He notes that his right ear feels like it is stopped up, starting within the last hour, though he admits this could be water from his shower.   The pt has stopped Metformin in the interim but has continued to take Lasix. He is taking half of his Lotensin at this time.  Lab results today (04/14/18) of CBC w/diff, CMP is as follows: all values are WNL except for WBC at 10.5k, RBC at 4.16, HGB at 11.5, HCT at 35.0, RDW at 15.0, ANC at 9.4k, Lymphs abs at 500, Glucose at 155, Albumin at 2.7, AST at 13. Magnesium 04/14/18 is WNL at 2.0 Phosphorous 04/14/18 is 3.2  On review of systems, pt reports weak appetite, 10 pound weight loss over the last week, and denies changes in breathing, cough, leg swelling, and any other symptoms.  MEDICAL HISTORY:  Past Medical History:  Diagnosis Date  . Alcohol abuse   . COPD (chronic obstructive pulmonary disease) ([place])   . Diabetes mellitus without complication ([place])   . Gout   . High cholesterol   . Hypertension   . Malignant neoplasm of right upper lobe of lung ([place]) 03/29/2018    SURGICAL HISTORY: Past  Surgical History:  Procedure Laterality Date  . COLONOSCOPY    . FLEXIBLE BRONCHOSCOPY [name] 03/21/2018   Procedure: FLEXIBLE BRONCHOSCOPY;  Surgeon: Marshell Garfinkel, MD;  Location: [place];  Service: Pulmonary;  Laterality: [name];  . NOSE SURGERY  90's  . VIDEO BRONCHOSCOPY WITH ENDOBRONCHIAL ULTRASOUND [name] 03/21/2018   Procedure: VIDEO BRONCHOSCOPY WITH ENDOBRONCHIAL ULTRASOUND;  Surgeon: Marshell Garfinkel, MD;  Location: [place];  Service: Pulmonary;  Laterality: [name];    SOCIAL HISTORY: Social History   Socioeconomic History  . Marital status: Married    Spouse name: Not on file  . Number of children: Not on file  . Years of education: Not on file  . Highest education level: Not on file  Occupational History  . Not on file  Social Needs  . Financial resource strain: Not on file  . Food insecurity:    Worry: Not on file    Inability: Not on file  . Transportation needs:    Medical: Not on file    Non-medical: Not on file  Tobacco Use  . Smoking status: Former Smoker    Packs/day: 0.25    Years: 50.00    Pack years: 12.50    Types: Cigarettes    Last attempt to quit: 07/2017    Years since quitting: 0.7  . Smokeless tobacco: Never Used  . Tobacco comment: quit smoking 07/2017  Substance and Sexual Activity  . Alcohol use: Yes    Alcohol/week: 8.4 - 12.6 oz    Types: 14 - 21 Cans of beer per week  . Drug use: No  . Sexual activity: Not on file  Lifestyle  . Physical activity:    Days per week: Not on file    Minutes per session: Not on file  . Stress: Not on file  Relationships  . Social connections:    Talks on phone: Not on file    Gets together: Not on file    Attends religious service: Not on file    Active member of club or organization: Not on file    Attends meetings of clubs or organizations: Not on file    Relationship status: Not on file  . Intimate partner violence:    Fear of current or ex partner: Not on file    Emotionally abused: Not on file     Physically abused: Not on file    Forced sexual activity: Not on file  Other Topics Concern  . Not on file  Social History Narrative  . Not on file    FAMILY HISTORY: Family History  Problem Relation Age of Onset  . Arthritis Mother   . Cancer Father   . Cancer Sister   . Heart disease Maternal Uncle   . Colon cancer Neg Hx     ALLERGIES:  is allergic to neomycin-bacitracin zn-polymyx.  MEDICATIONS:  Current Outpatient Medications  Medication Sig Dispense Refill  . aspirin 81 MG tablet Take 81 mg by mouth daily.    [name] atorvastatin (LIPITOR) 40 MG tablet Take 40 mg by mouth daily.     . benazepril (LOTENSIN) 20 MG tablet Take 10 mg by  mouth daily.     [name] dexamethasone (DECADRON) 4 MG tablet Take 2 tablets (8 mg total) by mouth daily. Start the day after chemotherapy for 2 days. 30 tablet 1  . folic acid (FOLVITE) 505 MCG tablet Take 400 mcg by mouth daily.    . furosemide (LASIX) 40 MG tablet Take 20 mg by mouth 2 (two) times daily.     . hydrALAZINE (APRESOLINE) 10 MG tablet Take 10 mg by mouth 2 (two) times daily.     . Ipratropium-Albuterol (COMBIVENT IN) Inhale 2 puffs into the lungs every 6 (six) hours as needed (for shortness of breath/wheezing.).    [name] metFORMIN (GLUCOPHAGE) 500 MG tablet Take 500 mg by mouth 2 (two) times daily with a meal.     . ondansetron (ZOFRAN) 8 MG tablet Take 1 tablet (8 mg total) by mouth 2 (two) times daily as needed for refractory nausea / vomiting. Start on day 3 after chemo. 30 tablet 1  . potassium chloride SA (K-DUR,KLOR-CON) 20 MEQ tablet Take 20 mEq by mouth daily.     . prochlorperazine (COMPAZINE) 10 MG tablet Take 1 tablet (10 mg total) by mouth every 6 (six) hours as needed (Nausea or vomiting). 30 tablet 1  . umeclidinium-vilanterol (ANORO ELLIPTA) 62.5-25 MCG/INH AEPB Inhale 1 puff into the lungs daily.      Current Facility-Administered Medications  Medication Dose Route Frequency Provider Last Rate Last Dose  . 0.9 %  sodium  chloride infusion   Intravenous Continuous Irene Limbo, Cloria Spring, MD        REVIEW OF SYSTEMS:    A 10+ POINT REVIEW OF SYSTEMS WAS OBTAINED including neurology, dermatology, psychiatry, cardiac, respiratory, lymph, extremities, GI, GU, Musculoskeletal, constitutional, breasts, reproductive, HEENT.  All pertinent positives are noted in the HPI.  All others are negative.   PHYSICAL EXAMINATION: ECOG PERFORMANCE STATUS: 1 - Symptomatic but completely ambulatory  . Vitals:   04/14/18 0903  BP: (!) 88/63  Pulse: 96  Resp: 18  Temp: 98.1 F (36.7 C)  SpO2: 97%   Filed Weights   04/14/18 0903  Weight: 194 lb 8 oz (88.2 kg)   .Body mass index is 27.91 kg/m.  GENERAL:alert, in no acute distress and comfortable SKIN: no acute rashes, no significant lesions EYES: conjunctiva are pink and non-injected, sclera anicteric OROPHARYNX: MMM, no exudates, no oropharyngeal erythema or ulceration NECK: supple, no JVD LYMPH:  no palpable lymphadenopathy in the cervical, axillary or inguinal regions LUNGS: decreased air entry Rt upper and mid zones HEART: regular rate & rhythm ABDOMEN:  normoactive bowel sounds , non tender, not distended. No palpable hepatosplenomegaly.  Extremity: no pedal edema PSYCH: alert & oriented x 3 with fluent speech NEURO: no focal motor/sensory deficits   LABORATORY DATA:  I have reviewed the data as listed  . CBC Latest Ref Rng & Units 04/14/2018 04/07/2018 03/27/2018  WBC 4.0 - 10.3 K/uL 10.5(H) 11.1(H) 13.3(H)  Hemoglobin 13.0 - 17.1 g/dL 11.5(L) 10.2(L) 11.0(L)  Hematocrit 38.4 - 49.9 % 35.0(L) 32.7(L) 33.9(L)  Platelets 140 - 400 K/uL 259 308 238    . CMP Latest Ref Rng & Units 04/14/2018 04/07/2018 03/27/2018  Glucose 70 - 99 mg/dL 155(H) 167(H) 132(H)  BUN 8 - 23 mg/dL _0 Creatinine 0.61 - 1.24 mg/dL 1.00 0.89 1.07  Sodium 135 - 145 mmol/L 138 136 139  Potassium 3.5 - 5.1 mmol/L 4.4 4.0 3.7  Chloride 98 - 111 mmol/L 103 100 102  CO2 22 - 32  mmol/L _0 Calcium 8.9 - 10.3 mg/dL 9.7 9.4 9.3  Total Protein 6.5 - 8.1 g/dL 7.8 7.6 7.4  Total Bilirubin 0.3 - 1.2 mg/dL 0.5 0.3 0.5  Alkaline Phos 38 - 126 U/L 57 57 56  AST 15 - 41 U/L 13(L) 11(L) 10(L)  ALT 0 - 44 U/L _1 03/21/18   03/25/18 Endobronchial bx:     RADIOGRAPHIC STUDIES: I have personally reviewed the radiological images as listed and agreed with the findings in the report. Mr Jeri Cos Wo Contrast  Result Date: 03/23/2018 CLINICAL DATA:  Small-cell lung cancer staging. EXAM: MRI HEAD WITHOUT AND WITH CONTRAST TECHNIQUE: Multiplanar, multiecho pulse sequences of the brain and surrounding structures were obtained without and with intravenous contrast. CONTRAST:  15m MULTIHANCE GADOBENATE DIMEGLUMINE 529 MG/ML IV SOLN COMPARISON:  None. FINDINGS: Brain: There is no evidence of acute infarct, intracranial hemorrhage, mass, midline shift, or extra-axial fluid collection. There is moderately advanced generalized cerebral and cerebellar atrophy. No white matter disease is evident. No abnormal brain parenchymal or meningeal enhancement is identified. Vascular: Major intracranial vascular flow voids are preserved. Skull and upper cervical spine: Unremarkable bone marrow signal. Sinuses/Orbits: Unremarkable orbits. Left larger than right maxillary sinus mucous retention cysts. Mild bilateral ethmoid air cell mucosal thickening. Clear mastoid air cells. Other: None. IMPRESSION: 1. No evidence of intracranial metastases. 2. Moderately advanced brain parenchymal volume loss. Electronically Signed   By: ALogan BoresM.D.   On: 03/23/2018 14:49   Dg Chest Port 1 View  Result Date: 03/22/2018 CLINICAL DATA:  Acute respiratory failure. EXAM: PORTABLE CHEST 1 VIEW COMPARISON:  03/21/2018 FINDINGS: The cardiomediastinal silhouette is unchanged. There is persistent cut off of the right mainstem bronchus by the known obstructing right hilar mass demonstrated on prior PET-CT. Right  hemithorax volume loss is again noted with rightward mediastinal shift and dense right upper lung opacity corresponding to upper lobe collapse and consolidation. The right lower lung and left lung remain clear. No sizable pleural effusion or pneumothorax is identified. IMPRESSION: Unchanged postobstructive right upper lobe collapse. Electronically Signed   By: ALogan BoresM.D.   On: 03/22/2018 08:31   Dg Chest Portable 1 View  Result Date: 03/21/2018 CLINICAL DATA:  Status post bronchoscopy. EXAM: PORTABLE CHEST 1 VIEW COMPARISON:  PET scan of March 10, 2018.  Radiograph January 12, 2016. FINDINGS: Stable cardiac size. Left lung is clear. No pneumothorax is noted. Large right upper lobe airspace opacity is noted concerning for postobstructive atelectasis as noted on prior PET scan with underlying mass. Bony thorax is unremarkable. IMPRESSION: Large right upper lobe airspace opacity is noted consistent with postobstructive atelectasis secondary to central obstructive mass as noted on prior PET scan. No pneumothorax is noted status post bronchoscopy. Electronically Signed   By: JMarijo Conception M.D.   On: 03/21/2018 15:12   Dg C-arm Bronchoscopy  Result Date: 03/21/2018 C-ARM BRONCHOSCOPY: Fluoroscopy was utilized by the requesting physician.  No radiographic interpretation.    ASSESSMENT & PLAN:   72y.o.  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 Post obstructive collapse of the Right upper lobe. No hypermetabolic mediastinal or supraclavicular lymph  nodes. No evidence of distant mets.   2) RUL airway obstruction with RUL  lung collapse. 3) Concern for severe narrowing of the right main pulmonary artery as well as right upper lobe and right middle lobe arteries.   PLAN -Discussed that the pt will let me know if he would like to have a port placed -Have ordered anti-nausea meds -Drink at least 48-64 oz of water each day -Recommend pt walk 20-30 minutes each day, using his cane -Discussed pt labwork today, 04/14/18; HGB at 11.5, PLT at normal at 259k, ANC at 9.4k, Albumin at 2.7, Magnesium normal -Recommended using saline spray in right ear  -Pt has lost 10 pounds in the last week and I continued to emphasize that maintaining his nutritional and functional status will be very important for his ability to tolerate treatment well -Pt is now willing to take a referral to PT and our nutritional therapist - will refer accordingly -Will order Marinol for appetite stimulation -The pt has no prohibitive toxicities from continuing Carboplatin/Taxol at this time.   -Ordering IVF today -Stop Metformin and watch blood sugars at home, hold Lasix and cut Lotensin in half. - pt will let me or Dr Coletta Memos know if his blood sugars become uncontrolled >300 -Will see pt back in one week   Continue weekly Carbo/taxol as per orders RTC with Dr Irene Limbo in 1 week with labs    All of the patients questions were answered with apparent satisfaction. The patient knows to call the clinic with any problems, questions or concerns.  The total time spent in the appt was 30 minutes and more than 50% was on counseling and direct patient cares.     Sullivan Lone MD MS AAHIVMS [facility] Hematology/Oncology Physician [facility]  (Office):       [phone] (Work cell):  [phone] (Fax):           [phone]  04/14/2018 9:44 AM  I, Baldwin [place], am acting as a Education administrator for Dr Irene Limbo.   .I have reviewed the above documentation for accuracy and completeness, and I agree with the above. Brunetta Genera MD

## 2018-04-14 NOTE — Telephone Encounter (Signed)
Added f/u 7/22 @ 12:40 pm - date/time ok'd per GK and he will see patient in inf. Start time, Lab/tx times remain the same. [facility] is listed as out of network in [place]. Called clinic and spoke with Ubaldo Glassing. Per Ubaldo Glassing the clinic is still utilizing the [facility] and they will call patient re appointment. [place] for 7/15 complete. Spoke with relative prior to leaving re needing to check with [place] re f/u time. Per relative they will get new schedule tomorrow when they come in for xrt.

## 2018-04-15 ENCOUNTER — Ambulatory Visit
Admission: RE | Admit: 2018-04-15 | Discharge: 2018-04-15 | Disposition: A | Discharge status: Home / Self Care | Payer: Medicare HMO | Source: Ambulatory Visit | Attending: Radiation Oncology | Admitting: Radiation Oncology

## 2018-04-15 DIAGNOSIS — Z510 Encounter for antineoplastic radiation therapy: Secondary | ICD-10-CM | POA: Diagnosis not present

## 2018-04-15 NOTE — Telephone Encounter (Signed)
Patient sister returned today for copy of July and August schedule.

## 2018-04-16 ENCOUNTER — Ambulatory Visit
Admission: RE | Admit: 2018-04-16 | Discharge: 2018-04-16 | Disposition: A | Discharge status: Home / Self Care | Payer: Medicare HMO | Source: Ambulatory Visit | Attending: Radiation Oncology | Admitting: Radiation Oncology

## 2018-04-16 ENCOUNTER — Ambulatory Visit: Payer: Medicare HMO | Admitting: Podiatry

## 2018-04-16 DIAGNOSIS — Z510 Encounter for antineoplastic radiation therapy: Secondary | ICD-10-CM | POA: Diagnosis not present

## 2018-04-17 ENCOUNTER — Ambulatory Visit
Admission: RE | Admit: 2018-04-17 | Discharge: 2018-04-17 | Disposition: A | Discharge status: Home / Self Care | Payer: Medicare HMO | Source: Ambulatory Visit | Attending: Radiation Oncology | Admitting: Radiation Oncology

## 2018-04-17 DIAGNOSIS — Z510 Encounter for antineoplastic radiation therapy: Secondary | ICD-10-CM | POA: Diagnosis not present

## 2018-04-18 ENCOUNTER — Ambulatory Visit
Admission: RE | Admit: 2018-04-18 | Discharge: 2018-04-18 | Disposition: A | Discharge status: Home / Self Care | Payer: Medicare HMO | Source: Ambulatory Visit | Attending: Radiation Oncology | Admitting: Radiation Oncology

## 2018-04-18 DIAGNOSIS — Z510 Encounter for antineoplastic radiation therapy: Secondary | ICD-10-CM | POA: Diagnosis not present

## 2018-04-21 ENCOUNTER — Inpatient Hospital Stay (HOSPITAL_BASED_OUTPATIENT_CLINIC_OR_DEPARTMENT_OTHER): Payer: Medicare HMO | Admitting: Hematology

## 2018-04-21 ENCOUNTER — Inpatient Hospital Stay: Payer: Medicare HMO

## 2018-04-21 ENCOUNTER — Ambulatory Visit
Admission: RE | Admit: 2018-04-21 | Discharge: 2018-04-21 | Disposition: A | Discharge status: Home / Self Care | Payer: Medicare HMO | Source: Ambulatory Visit | Attending: Radiation Oncology | Admitting: Radiation Oncology

## 2018-04-21 VITALS — BP 115/76 | HR 98 | Temp 98.2°F | Resp 17 | Ht 70.0 in | Wt 194.0 lb

## 2018-04-21 DIAGNOSIS — Z7189 Other specified counseling: Secondary | ICD-10-CM

## 2018-04-21 DIAGNOSIS — I10 Essential (primary) hypertension: Secondary | ICD-10-CM | POA: Diagnosis not present

## 2018-04-21 DIAGNOSIS — C3401 Malignant neoplasm of right main bronchus: Secondary | ICD-10-CM

## 2018-04-21 DIAGNOSIS — E860 Dehydration: Secondary | ICD-10-CM

## 2018-04-21 DIAGNOSIS — E440 Moderate protein-calorie malnutrition: Secondary | ICD-10-CM

## 2018-04-21 DIAGNOSIS — Z510 Encounter for antineoplastic radiation therapy: Secondary | ICD-10-CM | POA: Diagnosis not present

## 2018-04-21 DIAGNOSIS — Z5111 Encounter for antineoplastic chemotherapy: Secondary | ICD-10-CM | POA: Diagnosis not present

## 2018-04-21 DIAGNOSIS — C3491 Malignant neoplasm of unspecified part of right bronchus or lung: Secondary | ICD-10-CM

## 2018-04-21 DIAGNOSIS — J449 Chronic obstructive pulmonary disease, unspecified: Secondary | ICD-10-CM

## 2018-04-21 DIAGNOSIS — J988 Other specified respiratory disorders: Secondary | ICD-10-CM

## 2018-04-21 DIAGNOSIS — R630 Anorexia: Secondary | ICD-10-CM

## 2018-04-21 DIAGNOSIS — R634 Abnormal weight loss: Secondary | ICD-10-CM

## 2018-04-21 DIAGNOSIS — E119 Type 2 diabetes mellitus without complications: Secondary | ICD-10-CM | POA: Diagnosis not present

## 2018-04-21 LAB — CBC WITH DIFFERENTIAL/PLATELET
Basophils Absolute: 0 10*3/uL (ref 0.0–0.1)
Basophils Relative: 0 %
EOS ABS: 0 10*3/uL (ref 0.0–0.5)
EOS PCT: 1 %
HCT: 34.3 % — ABNORMAL LOW (ref 38.4–49.9)
Hemoglobin: 10.8 g/dL — ABNORMAL LOW (ref 13.0–17.1)
Lymphocytes Relative: 7 %
Lymphs Abs: 0.4 10*3/uL — ABNORMAL LOW (ref 0.9–3.3)
MCH: 27.4 pg (ref 27.2–33.4)
MCHC: 31.5 g/dL — AB (ref 32.0–36.0)
MCV: 87.1 fL (ref 79.3–98.0)
MONO ABS: 0.4 10*3/uL (ref 0.1–0.9)
MONOS PCT: 6 %
Neutro Abs: 5.1 10*3/uL (ref 1.5–6.5)
Neutrophils Relative %: 86 %
PLATELETS: 149 10*3/uL (ref 140–400)
RBC: 3.94 MIL/uL — ABNORMAL LOW (ref 4.20–5.82)
RDW: 14.7 % — AB (ref 11.0–14.6)
WBC: 5.9 10*3/uL (ref 4.0–10.3)

## 2018-04-21 LAB — CMP (CANCER CENTER ONLY)
ALT: 10 U/L (ref 0–44)
AST: 12 U/L — AB (ref 15–41)
Albumin: 2.7 g/dL — ABNORMAL LOW (ref 3.5–5.0)
Alkaline Phosphatase: 62 U/L (ref 38–126)
Anion gap: 8 (ref 5–15)
BUN: 15 mg/dL (ref 8–23)
CHLORIDE: 106 mmol/L (ref 98–111)
CO2: 26 mmol/L (ref 22–32)
CREATININE: 0.86 mg/dL (ref 0.61–1.24)
Calcium: 9.3 mg/dL (ref 8.9–10.3)
GFR, Est AFR Am: >60 mL/min (ref >60)
Glucose, Bld: 183 mg/dL — ABNORMAL HIGH (ref 70–99)
Potassium: 4.1 mmol/L (ref 3.5–5.1)
Sodium: 140 mmol/L (ref 135–145)
Total Bilirubin: 0.4 mg/dL (ref 0.3–1.2)
Total Protein: 6.9 g/dL (ref 6.5–8.1)

## 2018-04-21 LAB — MAGNESIUM: Magnesium: 1.7 mg/dL (ref 1.7–2.4)

## 2018-04-21 MED ORDER — SODIUM CHLORIDE 0.9 % IV SOLN
45.0000 mg/m2 | Freq: Once | INTRAVENOUS | Status: AC
  Administered 2018-04-21: 96 mg via INTRAVENOUS
  Filled 2018-04-21: qty 16

## 2018-04-21 MED ORDER — SODIUM CHLORIDE 0.9 % IV SOLN: 232.4000 mg | Freq: Once | INTRAVENOUS | Status: DC

## 2018-04-21 MED ORDER — SODIUM CHLORIDE 0.9 % IV SOLN
Freq: Once | INTRAVENOUS | Status: AC
  Administered 2018-04-21: 11:00:00 via INTRAVENOUS

## 2018-04-21 MED ORDER — FAMOTIDINE IN NACL 20-0.9 MG/50ML-% IV SOLN
INTRAVENOUS | Status: AC
  Filled 2018-04-21: qty 50

## 2018-04-21 MED ORDER — SODIUM CHLORIDE 0.9 % IV SOLN
20.0000 mg | Freq: Once | INTRAVENOUS | Status: AC
  Administered 2018-04-21: 20 mg via INTRAVENOUS
  Filled 2018-04-21: qty 2

## 2018-04-21 MED ORDER — PALONOSETRON HCL INJECTION 0.25 MG/5ML
INTRAVENOUS | Status: AC
  Filled 2018-04-21: qty 5

## 2018-04-21 MED ORDER — DIPHENHYDRAMINE HCL 50 MG/ML IJ SOLN
INTRAMUSCULAR | Status: AC
  Filled 2018-04-21: qty 1

## 2018-04-21 MED ORDER — SODIUM CHLORIDE 0.9 % IV SOLN
232.4000 mg | Freq: Once | INTRAVENOUS | Status: AC
  Administered 2018-04-21: 230 mg via INTRAVENOUS
  Filled 2018-04-21: qty 23

## 2018-04-21 MED ORDER — PALONOSETRON HCL INJECTION 0.25 MG/5ML
0.2500 mg | Freq: Once | INTRAVENOUS | Status: AC
  Administered 2018-04-21: 0.25 mg via INTRAVENOUS

## 2018-04-21 MED ORDER — DIPHENHYDRAMINE HCL 50 MG/ML IJ SOLN
50.0000 mg | Freq: Once | INTRAMUSCULAR | Status: AC
  Administered 2018-04-21: 50 mg via INTRAVENOUS

## 2018-04-21 MED ORDER — FAMOTIDINE IN NACL 20-0.9 MG/50ML-% IV SOLN
20.0000 mg | Freq: Once | INTRAVENOUS | Status: AC
  Administered 2018-04-21: 20 mg via INTRAVENOUS

## 2018-04-21 NOTE — Patient Instructions (Signed)
[facility] Discharge Instructions for Patients Receiving Chemotherapy  Today you received the following chemotherapy agents: Paclitaxel (Taxol) and Carboplatin (Paraplatin)  To help prevent nausea and vomiting after your treatment, we encourage you to take your nausea medication as prescribed. Received Aloxi during treatment today-->Take Compazine (not Zofran) for the next 3 days as needed.   If you develop nausea and vomiting that is not controlled by your nausea medication, call the clinic.   BELOW ARE SYMPTOMS THAT SHOULD BE REPORTED IMMEDIATELY:  *FEVER GREATER THAN 100.5 F  *CHILLS WITH OR WITHOUT FEVER  NAUSEA AND VOMITING THAT IS NOT CONTROLLED WITH YOUR NAUSEA MEDICATION  *UNUSUAL SHORTNESS OF BREATH  *UNUSUAL BRUISING OR BLEEDING  TENDERNESS IN MOUTH AND THROAT WITH OR WITHOUT PRESENCE OF ULCERS  *URINARY PROBLEMS  *BOWEL PROBLEMS  UNUSUAL RASH Items with * indicate a potential emergency and should be followed up as soon as possible.  Feel free to call the clinic should you have any questions or concerns. The clinic phone number is [phone].  Please show the CHEMO ALERT CARD at check-in to the Emergency Department and triage nurse.   

## 2018-04-22 ENCOUNTER — Ambulatory Visit
Admission: RE | Admit: 2018-04-22 | Discharge: 2018-04-22 | Disposition: A | Discharge status: Home / Self Care | Payer: Medicare HMO | Source: Ambulatory Visit | Attending: Radiation Oncology | Admitting: Radiation Oncology

## 2018-04-22 ENCOUNTER — Ambulatory Visit: Payer: Medicare HMO | Admitting: Physical Therapy

## 2018-04-22 DIAGNOSIS — Z510 Encounter for antineoplastic radiation therapy: Secondary | ICD-10-CM | POA: Diagnosis not present

## 2018-04-23 ENCOUNTER — Ambulatory Visit
Admission: RE | Admit: 2018-04-23 | Discharge: 2018-04-23 | Disposition: A | Discharge status: Home / Self Care | Payer: Medicare HMO | Source: Ambulatory Visit | Attending: Radiation Oncology | Admitting: Radiation Oncology

## 2018-04-23 DIAGNOSIS — Z510 Encounter for antineoplastic radiation therapy: Secondary | ICD-10-CM | POA: Diagnosis not present

## 2018-04-24 ENCOUNTER — Ambulatory Visit
Admission: RE | Admit: 2018-04-24 | Discharge: 2018-04-24 | Disposition: A | Discharge status: Home / Self Care | Payer: Medicare HMO | Source: Ambulatory Visit | Attending: Radiation Oncology | Admitting: Radiation Oncology

## 2018-04-24 DIAGNOSIS — Z510 Encounter for antineoplastic radiation therapy: Secondary | ICD-10-CM | POA: Diagnosis not present

## 2018-04-25 ENCOUNTER — Ambulatory Visit
Admission: RE | Admit: 2018-04-25 | Discharge: 2018-04-25 | Disposition: A | Discharge status: Home / Self Care | Payer: Medicare HMO | Source: Ambulatory Visit | Attending: Radiation Oncology | Admitting: Radiation Oncology

## 2018-04-25 DIAGNOSIS — Z510 Encounter for antineoplastic radiation therapy: Secondary | ICD-10-CM | POA: Diagnosis not present

## 2018-04-28 ENCOUNTER — Other Ambulatory Visit: Payer: Self-pay | Admitting: Hematology

## 2018-04-28 ENCOUNTER — Inpatient Hospital Stay: Payer: Medicare HMO | Admitting: Nutrition

## 2018-04-28 ENCOUNTER — Inpatient Hospital Stay: Payer: Medicare HMO

## 2018-04-28 ENCOUNTER — Ambulatory Visit
Admission: RE | Admit: 2018-04-28 | Discharge: 2018-04-28 | Disposition: A | Discharge status: Home / Self Care | Payer: Medicare HMO | Source: Ambulatory Visit | Attending: Radiation Oncology | Admitting: Radiation Oncology

## 2018-04-28 VITALS — BP 114/76 | HR 105 | Temp 97.6°F | Resp 18 | Wt 195.0 lb

## 2018-04-28 DIAGNOSIS — Z7189 Other specified counseling: Secondary | ICD-10-CM

## 2018-04-28 DIAGNOSIS — C3491 Malignant neoplasm of unspecified part of right bronchus or lung: Secondary | ICD-10-CM

## 2018-04-28 DIAGNOSIS — Z510 Encounter for antineoplastic radiation therapy: Secondary | ICD-10-CM | POA: Diagnosis not present

## 2018-04-28 DIAGNOSIS — Z5111 Encounter for antineoplastic chemotherapy: Secondary | ICD-10-CM | POA: Diagnosis not present

## 2018-04-28 LAB — CMP (CANCER CENTER ONLY)
ALBUMIN: 2.8 g/dL — AB (ref 3.5–5.0)
ALK PHOS: 65 U/L (ref 38–126)
ALT: 14 U/L (ref 0–44)
ANION GAP: 7 (ref 5–15)
AST: 13 U/L — ABNORMAL LOW (ref 15–41)
BUN: 12 mg/dL (ref 8–23)
CALCIUM: 9 mg/dL (ref 8.9–10.3)
CO2: 25 mmol/L (ref 22–32)
Chloride: 107 mmol/L (ref 98–111)
Creatinine: 0.85 mg/dL (ref 0.61–1.24)
GFR, Estimated: >60 mL/min (ref >60)
GLUCOSE: 185 mg/dL — AB (ref 70–99)
POTASSIUM: 4 mmol/L (ref 3.5–5.1)
SODIUM: 139 mmol/L (ref 135–145)
Total Bilirubin: 0.4 mg/dL (ref 0.3–1.2)
Total Protein: 6.6 g/dL (ref 6.5–8.1)

## 2018-04-28 LAB — CBC WITH DIFFERENTIAL/PLATELET
BASOS ABS: 0 10*3/uL (ref 0.0–0.1)
BASOS PCT: 1 %
Eosinophils Absolute: 0 10*3/uL (ref 0.0–0.5)
Eosinophils Relative: 1 %
HEMATOCRIT: 33.4 % — AB (ref 38.4–49.9)
HEMOGLOBIN: 11 g/dL — AB (ref 13.0–17.1)
LYMPHS PCT: 9 %
Lymphs Abs: 0.3 10*3/uL — ABNORMAL LOW (ref 0.9–3.3)
MCH: 27.5 pg (ref 27.2–33.4)
MCHC: 32.9 g/dL (ref 32.0–36.0)
MCV: 83.4 fL (ref 79.3–98.0)
MONO ABS: 0.2 10*3/uL (ref 0.1–0.9)
MONOS PCT: 5 %
NEUTROS ABS: 3 10*3/uL (ref 1.5–6.5)
Neutrophils Relative %: 84 %
Platelets: 135 10*3/uL — ABNORMAL LOW (ref 140–400)
RBC: 4 MIL/uL — ABNORMAL LOW (ref 4.20–5.82)
RDW: 15.6 % — ABNORMAL HIGH (ref 11.0–14.6)
WBC: 3.5 10*3/uL — ABNORMAL LOW (ref 4.0–10.3)

## 2018-04-28 LAB — PHOSPHORUS: PHOSPHORUS: 2.7 mg/dL (ref 2.5–4.6)

## 2018-04-28 LAB — MAGNESIUM: MAGNESIUM: 1.4 mg/dL — AB (ref 1.7–2.4)

## 2018-04-28 MED ORDER — PALONOSETRON HCL INJECTION 0.25 MG/5ML
INTRAVENOUS | Status: AC
  Filled 2018-04-28: qty 5

## 2018-04-28 MED ORDER — FAMOTIDINE IN NACL 20-0.9 MG/50ML-% IV SOLN
INTRAVENOUS | Status: AC
Start: 2018-04-28 — End: ?
  Filled 2018-04-28: qty 50

## 2018-04-28 MED ORDER — CARBOPLATIN CHEMO INJECTION 450 MG/45ML
232.4000 mg | Freq: Once | INTRAVENOUS | Status: AC
  Administered 2018-04-28: 230 mg via INTRAVENOUS
  Filled 2018-04-28: qty 23

## 2018-04-28 MED ORDER — DIPHENHYDRAMINE HCL 50 MG/ML IJ SOLN
INTRAMUSCULAR | Status: AC
  Filled 2018-04-28: qty 1

## 2018-04-28 MED ORDER — SODIUM CHLORIDE 0.9 % IV SOLN
20.0000 mg | Freq: Once | INTRAVENOUS | Status: AC
  Administered 2018-04-28: 20 mg via INTRAVENOUS
  Filled 2018-04-28: qty 2

## 2018-04-28 MED ORDER — PALONOSETRON HCL INJECTION 0.25 MG/5ML
0.2500 mg | Freq: Once | INTRAVENOUS | Status: AC
  Administered 2018-04-28: 0.25 mg via INTRAVENOUS

## 2018-04-28 MED ORDER — FAMOTIDINE IN NACL 20-0.9 MG/50ML-% IV SOLN
20.0000 mg | Freq: Once | INTRAVENOUS | Status: AC
  Administered 2018-04-28: 20 mg via INTRAVENOUS

## 2018-04-28 MED ORDER — DIPHENHYDRAMINE HCL 50 MG/ML IJ SOLN
50.0000 mg | Freq: Once | INTRAMUSCULAR | Status: AC
  Administered 2018-04-28: 50 mg via INTRAVENOUS

## 2018-04-28 MED ORDER — SODIUM CHLORIDE 0.9 % IV SOLN
Freq: Once | INTRAVENOUS | Status: AC
  Administered 2018-04-28: 10:00:00 via INTRAVENOUS
  Filled 2018-04-28: qty 250

## 2018-04-28 MED ORDER — SODIUM CHLORIDE 0.9 % IV SOLN
45.0000 mg/m2 | Freq: Once | INTRAVENOUS | Status: AC
  Administered 2018-04-28: 96 mg via INTRAVENOUS
  Filled 2018-04-28: qty 16

## 2018-04-28 MED ORDER — MAGNESIUM SULFATE 50 % IJ SOLN
1000.0000 mg | Freq: Once | INTRAVENOUS | Status: AC
  Administered 2018-04-28: 1000 mg via INTRAVENOUS
  Filled 2018-04-28: qty 2

## 2018-04-28 NOTE — Progress Notes (Signed)
[name]    HEMATOLOGY/ONCOLOGY CONSULTATION NOTE  Date of Service: 04/28/2018  Patient Care Team: Bernerd Limbo, MD as PCP - General (Family Medicine)  CHIEF COMPLAINTS/PURPOSE OF CONSULTATION:  Mx of recently diagnosed lung cancer  HISTORY OF PRESENTING ILLNESS:   Barry Booker is a wonderful 72 y.o. male who has been referred to [place] by Dr Marshell Garfinkel for evaluation and management of presumed newly diagnosed rt sided lung cancer with concern for bronchial obstruction.  Patient has a history of hypertension, dyslipidemia, diabetes, COPD, alcohol abuse and previously had a chest x-ray on 02/20/2018 at [facility] which showed Right upper lobe consolidation/atelectasis may relate to post obstructive pneumonitis/pneumonia. An underlying obstructing lesion should be considered. Consider CT for further assessment.  H subsequently then had a CT of the chest on 02/27/2018 which showed -5.8 x 4.9 cm mass in the right mediastinum and hilum with associated severe narrowing of the right main pulmonary artery as well as right upper lobe and right middle lobe arteries. Obstructed right main stem bronchus and collapse of the right upper lobe and right middle lobe. Small right effusion. The appearance is similar to the patient's chest radiograph from 02/20/2018.  Patient subsequently had a PET CT on 03/10/2018 by Dr. Vaughan Browner which showed Large intensely hypermetabolic RIGHT hilar mass obstructs the RIGHT upper lobe bronchus and surrounds the bronchus intermedius. Post obstructive collapse of the RIGHT upper lobe.  No hypermetabolic mediastinal or supraclavicular lymph nodes. No evidence distant metastatic disease.  Patient had a flexible video fiberoptic bronchoscopy done on 03/21/2018 by Dr. Vaughan Browner which showed which showed some nodularity of the right mainstem bronchial mucosa with near complete occlusion of right main stem. Unable to traverse the occluded part of right main stem. The mucosa was friable with propensity  to bleed. Endobronchial biopsies and brushings were taken from the R mainstem and right upper lobe for pathology.  Preliminary results showed atypical cells however final cytology/pathology is currently pending.  Patient report rt sided chest pain with deep inspiration, anorexia and weight loss of about 30lbs in the last 6 months.  Interval History:   Barry Booker returns to clinic today for next dose of carbo/taxol chemotherapy. Notes he is trying to eat better but still with weak appetite. Labs stable. No prohibitive toxicity from chemotherapy. Notes breathing better. No CP or new SOB. Grade 1 fatigue.  MEDICAL HISTORY:  Past Medical History:  Diagnosis Date  . Alcohol abuse   . COPD (chronic obstructive pulmonary disease) ([place])   . Diabetes mellitus without complication ([place])   . Gout   . High cholesterol   . Hypertension   . Malignant neoplasm of right upper lobe of lung ([place]) 03/29/2018    SURGICAL HISTORY: Past Surgical History:  Procedure Laterality Date  . COLONOSCOPY    . FLEXIBLE BRONCHOSCOPY [name] 03/21/2018   Procedure: FLEXIBLE BRONCHOSCOPY;  Surgeon: Marshell Garfinkel, MD;  Location: [place];  Service: Pulmonary;  Laterality: [name];  . NOSE SURGERY  90's  . VIDEO BRONCHOSCOPY WITH ENDOBRONCHIAL ULTRASOUND [name] 03/21/2018   Procedure: VIDEO BRONCHOSCOPY WITH ENDOBRONCHIAL ULTRASOUND;  Surgeon: Marshell Garfinkel, MD;  Location: [place];  Service: Pulmonary;  Laterality: [name];    SOCIAL HISTORY: Social History   Socioeconomic History  . Marital status: Married    Spouse name: Not on file  . Number of children: Not on file  . Years of education: Not on file  . Highest education level: Not on file  Occupational History  . Not on file  Social Needs  . Financial resource strain: Not on file  . Food insecurity:    Worry: Not on file    Inability: Not on file  . Transportation needs:    Medical: Not on file    Non-medical: Not on file  Tobacco Use  . Smoking status:  Former Smoker    Packs/day: 0.25    Years: 50.00    Pack years: 12.50    Types: Cigarettes    Last attempt to quit: 07/2017    Years since quitting: 0.8  . Smokeless tobacco: Never Used  . Tobacco comment: quit smoking 07/2017  Substance and Sexual Activity  . Alcohol use: Yes    Alcohol/week: 8.4 - 12.6 oz    Types: 14 - 21 Cans of beer per week  . Drug use: No  . Sexual activity: Not on file  Lifestyle  . Physical activity:    Days per week: Not on file    Minutes per session: Not on file  . Stress: Not on file  Relationships  . Social connections:    Talks on phone: Not on file    Gets together: Not on file    Attends religious service: Not on file    Active member of club or organization: Not on file    Attends meetings of clubs or organizations: Not on file    Relationship status: Not on file  . Intimate partner violence:    Fear of current or ex partner: Not on file    Emotionally abused: Not on file    Physically abused: Not on file    Forced sexual activity: Not on file  Other Topics Concern  . Not on file  Social History Narrative  . Not on file    FAMILY HISTORY: Family History  Problem Relation Age of Onset  . Arthritis Mother   . Cancer Father   . Cancer Sister   . Heart disease Maternal Uncle   . Colon cancer Neg Hx     ALLERGIES:  is allergic to neomycin-bacitracin zn-polymyx.  MEDICATIONS:  Current Outpatient Medications  Medication Sig Dispense Refill  . aspirin 81 MG tablet Take 81 mg by mouth daily.    [name] atorvastatin (LIPITOR) 40 MG tablet Take 40 mg by mouth daily.     . benazepril (LOTENSIN) 20 MG tablet Take 10 mg by mouth daily.     [name] dexamethasone (DECADRON) 4 MG tablet Take 2 tablets (8 mg total) by mouth daily. Start the day after chemotherapy for 2 days. 30 tablet 1  . dronabinol (MARINOL) 5 MG capsule Take 1 capsule (5 mg total) by mouth 2 (two) times daily before a meal. 60 capsule 0  . folic acid (FOLVITE) 828 MCG tablet Take 400  mcg by mouth daily.    . hydrALAZINE (APRESOLINE) 10 MG tablet Take 10 mg by mouth 2 (two) times daily.     . Ipratropium-Albuterol (COMBIVENT IN) Inhale 2 puffs into the lungs every 6 (six) hours as needed (for shortness of breath/wheezing.).    [name] ondansetron (ZOFRAN) 8 MG tablet Take 1 tablet (8 mg total) by mouth 2 (two) times daily as needed for refractory nausea / vomiting. Start on day 3 after chemo. 30 tablet 1  . potassium chloride SA (K-DUR,KLOR-CON) 20 MEQ tablet Take 20 mEq by mouth daily.     . prochlorperazine (COMPAZINE) 10 MG tablet Take 1 tablet (10 mg total) by mouth every 6 (six) hours as needed (Nausea or vomiting). 30 tablet  1  . umeclidinium-vilanterol (ANORO ELLIPTA) 62.5-25 MCG/INH AEPB Inhale 1 puff into the lungs daily.      No current facility-administered medications for this visit.     REVIEW OF SYSTEMS:    A 10+ POINT REVIEW OF SYSTEMS WAS OBTAINED including neurology, dermatology, psychiatry, cardiac, respiratory, lymph, extremities, GI, GU, Musculoskeletal, constitutional, breasts, reproductive, HEENT.  All pertinent positives are noted in the HPI.  All others are negative.   PHYSICAL EXAMINATION: ECOG PERFORMANCE STATUS: 1 - Symptomatic but completely ambulatory  . There were no vitals filed for this visit. There were no vitals filed for this visit. .There is no height or weight on file to calculate BMI.  GENERAL:alert, in no acute distress and comfortable SKIN: no acute rashes, no significant lesions EYES: conjunctiva are pink and non-injected, sclera anicteric OROPHARYNX: MMM, no exudates, no oropharyngeal erythema or ulceration NECK: supple, no JVD LYMPH:  no palpable lymphadenopathy in the cervical, axillary or inguinal regions LUNGS: decreased air entry Rt upper and mid zones HEART: regular rate & rhythm ABDOMEN:  normoactive bowel sounds , non tender, not distended. No palpable hepatosplenomegaly.  Extremity: no pedal edema PSYCH: alert &  oriented x 3 with fluent speech NEURO: no focal motor/sensory deficits   LABORATORY DATA:  I have reviewed the data as listed  . CBC Latest Ref Rng & Units 04/21/2018 04/14/2018  WBC 4.0 - 10.3 K/uL 5.9 10.5(H)  Hemoglobin 13.0 - 17.1 g/dL 10.8(L) 11.5(L)  Hematocrit 38.4 - 49.9 % 34.3(L) 35.0(L)  Platelets 140 - 400 K/uL 149 259    . CMP Latest Ref Rng & Units 04/21/2018 04/14/2018  Glucose 70 - 99 mg/dL 183(H) 155(H)  BUN 8 - 23 mg/dL 15 16  Creatinine 0.61 - 1.24 mg/dL 0.86 1.00  Sodium 135 - 145 mmol/L 140 138  Potassium 3.5 - 5.1 mmol/L 4.1 4.4  Chloride 98 - 111 mmol/L 106 103  CO2 22 - 32 mmol/L 26 25  Calcium 8.9 - 10.3 mg/dL 9.3 9.7  Total Protein 6.5 - 8.1 g/dL 6.9 7.8  Total Bilirubin 0.3 - 1.2 mg/dL 0.4 0.5  Alkaline Phos 38 - 126 U/L 62 57  AST 15 - 41 U/L 12(L) 13(L)  ALT 0 - 44 U/L 10 13   03/21/18   03/25/18 Endobronchial bx:     RADIOGRAPHIC STUDIES: I have personally reviewed the radiological images as listed and agreed with the findings in the report. No results found.  ASSESSMENT & PLAN:   72 y.o.  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 Post obstructive collapse of the Right upper lobe. No hypermetabolic mediastinal or supraclavicular lymph nodes. No evidence of distant mets.   2) RUL airway obstruction with RUL lung collapse. 3) Concern for severe narrowing of the right main pulmonary artery as well as right upper lobe and right middle lobe arteries.   PLAN -Discussed pt labwork today, 04/01/18; stable -no prohibitive toxicities from treatment thus far. -continue rx per  orders today -f/u with referral to PT and our nutritional therapist - will refer accordingly - Marinol for appetite stimulation -off Metformin and watch blood sugars at home, hold Lasix and cut Lotensin in half. - pt will  let me or Dr Coletta Memos know if his blood sugars become uncontrolled >300  Continue weekly Carbo/taxol as per orders RTC with Dr Irene Limbo in 1 week with labs    All of the patients questions were answered with apparent satisfaction. The patient knows to call the clinic with any problems, questions or concerns.  The total time spent in the appt was 25 minutes and more than 50% was on counseling and direct patient cares.     Sullivan Lone MD [place] AAHIVMS [facility] Hematology/Oncology Physician [facility]  (Office):       [phone] (Work cell):  [phone] (Fax):           [phone]

## 2018-04-28 NOTE — Patient Instructions (Signed)
   [facility] Discharge Instructions for Patients Receiving Chemotherapy  Today you received the following chemotherapy agents Taxol and Carboplatin   To help prevent nausea and vomiting after your treatment, we encourage you to take your nausea medication as directed.    If you develop nausea and vomiting that is not controlled by your nausea medication, call the clinic.   BELOW ARE SYMPTOMS THAT SHOULD BE REPORTED IMMEDIATELY:  *FEVER GREATER THAN 100.5 F  *CHILLS WITH OR WITHOUT FEVER  NAUSEA AND VOMITING THAT IS NOT CONTROLLED WITH YOUR NAUSEA MEDICATION  *UNUSUAL SHORTNESS OF BREATH  *UNUSUAL BRUISING OR BLEEDING  TENDERNESS IN MOUTH AND THROAT WITH OR WITHOUT PRESENCE OF ULCERS  *URINARY PROBLEMS  *BOWEL PROBLEMS  UNUSUAL RASH Items with * indicate a potential emergency and should be followed up as soon as possible.  Feel free to call the clinic should you have any questions or concerns. The clinic phone number is [phone].  Please show the CHEMO ALERT CARD at check-in to the Emergency Department and triage nurse.   

## 2018-04-28 NOTE — Progress Notes (Signed)
72 year old male diagnosed with lung cancer.  He is a patient of Dr. Irene Limbo.  Past medical history includes hypertension, hypercholesterolemia, gout, diabetes, COPD, and alcohol.  Medications include Lipitor, Decadron, Marinol, Zofran, and Compazine.  Labs were reviewed.  Noted glucose 185, albumin 2.8, and magnesium 1.4.  Height: 5 feet 10 inches. Weight: 195 pounds. Usual body weight: 201.6 pounds in June. BMI: 27.98.  Spoke primarily with patient's wife.   She reports patient's appetite and oral intake have improved. She reports patient does not take any medication for diabetes.  States she sometimes gives him a half of a pill if he has been eating a lot of sweets. They do not monitor his blood sugar. Wife offers patient a variety of foods. Wife reports she gives patient nausea medication so he has not experienced nausea.  Nutrition diagnosis:  Food and nutrition related knowledge deficit related to lung cancer and associated treatments as evidenced by no prior need for nutrition related information.  Intervention: Educated patient's wife to offer patient small, frequent meals and snacks with adequate calories and high-protein foods for weight maintenance. Educated her on the importance of adequate protein. Recommended she discuss diabetes and blood sugar medications with physician. Teach back method used.  Questions were answered.  Contact information provided. Provided patient with fact sheets on high-calorie, high-protein foods.  Monitoring, evaluation, goals: Patient will tolerate adequate calories and protein for weight maintenance.  Next visit: Monday, August 19 during infusion.  **Disclaimer: This note was dictated with voice recognition software. Similar sounding words can inadvertently be transcribed and this note may contain transcription errors which may not have been corrected upon publication of note.**

## 2018-04-29 ENCOUNTER — Ambulatory Visit
Admission: RE | Admit: 2018-04-29 | Discharge: 2018-04-29 | Disposition: A | Discharge status: Home / Self Care | Payer: Medicare HMO | Source: Ambulatory Visit | Attending: Radiation Oncology | Admitting: Radiation Oncology

## 2018-04-29 DIAGNOSIS — Z510 Encounter for antineoplastic radiation therapy: Secondary | ICD-10-CM | POA: Diagnosis not present

## 2018-04-30 ENCOUNTER — Ambulatory Visit
Admission: RE | Admit: 2018-04-30 | Discharge: 2018-04-30 | Disposition: A | Discharge status: Home / Self Care | Payer: Medicare HMO | Source: Ambulatory Visit | Attending: Radiation Oncology | Admitting: Radiation Oncology

## 2018-04-30 DIAGNOSIS — Z510 Encounter for antineoplastic radiation therapy: Secondary | ICD-10-CM | POA: Diagnosis not present

## 2018-05-01 ENCOUNTER — Ambulatory Visit
Admission: RE | Admit: 2018-05-01 | Discharge: 2018-05-01 | Disposition: A | Discharge status: Home / Self Care | Payer: Medicare HMO | Source: Ambulatory Visit | Attending: Radiation Oncology | Admitting: Radiation Oncology

## 2018-05-01 DIAGNOSIS — Z510 Encounter for antineoplastic radiation therapy: Secondary | ICD-10-CM | POA: Insufficient documentation

## 2018-05-01 DIAGNOSIS — C3411 Malignant neoplasm of upper lobe, right bronchus or lung: Secondary | ICD-10-CM | POA: Diagnosis not present

## 2018-05-02 ENCOUNTER — Other Ambulatory Visit: Payer: Self-pay | Admitting: Radiation Oncology

## 2018-05-02 ENCOUNTER — Ambulatory Visit
Admission: RE | Admit: 2018-05-02 | Discharge: 2018-05-02 | Disposition: A | Discharge status: Home / Self Care | Payer: Medicare HMO | Source: Ambulatory Visit | Attending: Radiation Oncology | Admitting: Radiation Oncology

## 2018-05-02 DIAGNOSIS — Z510 Encounter for antineoplastic radiation therapy: Secondary | ICD-10-CM | POA: Diagnosis not present

## 2018-05-02 MED ORDER — SUCRALFATE 1 G PO TABS: 1.0000 g | ORAL_TABLET | Freq: Three times a day (TID) | ORAL | 2 refills | Status: DC

## 2018-05-05 ENCOUNTER — Inpatient Hospital Stay (HOSPITAL_BASED_OUTPATIENT_CLINIC_OR_DEPARTMENT_OTHER): Payer: Medicare HMO | Admitting: Medical

## 2018-05-05 ENCOUNTER — Other Ambulatory Visit: Payer: Self-pay | Admitting: Hematology

## 2018-05-05 ENCOUNTER — Ambulatory Visit
Admission: RE | Admit: 2018-05-05 | Discharge: 2018-05-05 | Disposition: A | Discharge status: Home / Self Care | Payer: Medicare HMO | Source: Ambulatory Visit | Attending: Radiation Oncology | Admitting: Radiation Oncology

## 2018-05-05 ENCOUNTER — Other Ambulatory Visit: Payer: Self-pay | Admitting: Medical

## 2018-05-05 ENCOUNTER — Inpatient Hospital Stay: Payer: Medicare HMO | Attending: Hematology

## 2018-05-05 ENCOUNTER — Ambulatory Visit: Payer: Medicare HMO

## 2018-05-05 ENCOUNTER — Inpatient Hospital Stay: Payer: Medicare HMO

## 2018-05-05 VITALS — BP 120/84 | HR 96 | Temp 97.9°F | Resp 18

## 2018-05-05 DIAGNOSIS — E119 Type 2 diabetes mellitus without complications: Secondary | ICD-10-CM | POA: Insufficient documentation

## 2018-05-05 DIAGNOSIS — J9819 Other pulmonary collapse: Secondary | ICD-10-CM | POA: Insufficient documentation

## 2018-05-05 DIAGNOSIS — K2100 Gastro-esophageal reflux disease with esophagitis, without bleeding: Secondary | ICD-10-CM

## 2018-05-05 DIAGNOSIS — Y842 Radiological procedure and radiotherapy as the cause of abnormal reaction of the patient, or of later complication, without mention of misadventure at the time of the procedure: Secondary | ICD-10-CM | POA: Diagnosis not present

## 2018-05-05 DIAGNOSIS — J988 Other specified respiratory disorders: Secondary | ICD-10-CM | POA: Diagnosis not present

## 2018-05-05 DIAGNOSIS — K208 Other esophagitis without bleeding: Secondary | ICD-10-CM

## 2018-05-05 DIAGNOSIS — C3401 Malignant neoplasm of right main bronchus: Secondary | ICD-10-CM | POA: Diagnosis not present

## 2018-05-05 DIAGNOSIS — C3411 Malignant neoplasm of upper lobe, right bronchus or lung: Secondary | ICD-10-CM

## 2018-05-05 DIAGNOSIS — I10 Essential (primary) hypertension: Secondary | ICD-10-CM | POA: Diagnosis not present

## 2018-05-05 DIAGNOSIS — J449 Chronic obstructive pulmonary disease, unspecified: Secondary | ICD-10-CM | POA: Diagnosis not present

## 2018-05-05 DIAGNOSIS — F1010 Alcohol abuse, uncomplicated: Secondary | ICD-10-CM | POA: Diagnosis not present

## 2018-05-05 DIAGNOSIS — C3491 Malignant neoplasm of unspecified part of right bronchus or lung: Secondary | ICD-10-CM

## 2018-05-05 DIAGNOSIS — K219 Gastro-esophageal reflux disease without esophagitis: Secondary | ICD-10-CM

## 2018-05-05 DIAGNOSIS — Z510 Encounter for antineoplastic radiation therapy: Secondary | ICD-10-CM | POA: Diagnosis not present

## 2018-05-05 LAB — CBC WITH DIFFERENTIAL/PLATELET
Basophils Absolute: 0 10*3/uL (ref 0.0–0.1)
Basophils Relative: 0 %
EOS PCT: 0 %
Eosinophils Absolute: 0 10*3/uL (ref 0.0–0.5)
HEMATOCRIT: 32.7 % — AB (ref 38.4–49.9)
Hemoglobin: 10.4 g/dL — ABNORMAL LOW (ref 13.0–17.1)
LYMPHS ABS: 0.3 10*3/uL — AB (ref 0.9–3.3)
LYMPHS PCT: 9 %
MCH: 27.3 pg (ref 27.2–33.4)
MCHC: 31.8 g/dL — AB (ref 32.0–36.0)
MCV: 85.8 fL (ref 79.3–98.0)
MONOS PCT: 8 %
Monocytes Absolute: 0.3 10*3/uL (ref 0.1–0.9)
Neutro Abs: 2.5 10*3/uL (ref 1.5–6.5)
Neutrophils Relative %: 83 %
Platelets: 86 10*3/uL — ABNORMAL LOW (ref 140–400)
RBC: 3.81 MIL/uL — ABNORMAL LOW (ref 4.20–5.82)
RDW: 16.5 % — ABNORMAL HIGH (ref 11.0–14.6)
WBC: 3.1 10*3/uL — ABNORMAL LOW (ref 4.0–10.3)

## 2018-05-05 LAB — MAGNESIUM: Magnesium: 1.5 mg/dL — ABNORMAL LOW (ref 1.7–2.4)

## 2018-05-05 LAB — CMP (CANCER CENTER ONLY)
ALK PHOS: 65 U/L (ref 38–126)
ALT: 13 U/L (ref 0–44)
ANION GAP: 9 (ref 5–15)
AST: 11 U/L — AB (ref 15–41)
Albumin: 2.8 g/dL — ABNORMAL LOW (ref 3.5–5.0)
BUN: 13 mg/dL (ref 8–23)
CO2: 23 mmol/L (ref 22–32)
Calcium: 8.8 mg/dL — ABNORMAL LOW (ref 8.9–10.3)
Chloride: 107 mmol/L (ref 98–111)
Creatinine: 0.83 mg/dL (ref 0.61–1.24)
GFR, Est AFR Am: >60 mL/min (ref >60)
GFR, Estimated: >60 mL/min (ref >60)
Glucose, Bld: 153 mg/dL — ABNORMAL HIGH (ref 70–99)
Potassium: 3.7 mmol/L (ref 3.5–5.1)
SODIUM: 139 mmol/L (ref 135–145)
Total Bilirubin: 0.5 mg/dL (ref 0.3–1.2)
Total Protein: 6.4 g/dL — ABNORMAL LOW (ref 6.5–8.1)

## 2018-05-05 LAB — PHOSPHORUS: Phosphorus: 2.6 mg/dL (ref 2.5–4.6)

## 2018-05-05 MED ORDER — GI COCKTAIL ~~LOC~~: 30.0000 mL | Freq: Once | ORAL | Status: DC

## 2018-05-05 MED ORDER — MAGIC MOUTHWASH W/LIDOCAINE: ORAL | 1 refills | Status: DC

## 2018-05-05 MED ORDER — GI COCKTAIL ~~LOC~~
30.0000 mL | Freq: Once | ORAL | Status: DC
  Filled 2018-05-05: qty 30

## 2018-05-05 NOTE — Progress Notes (Signed)
Per. Dr. Irene Limbo, no treatment today with pt plt of 86.

## 2018-05-06 ENCOUNTER — Ambulatory Visit: Payer: Medicare HMO

## 2018-05-06 ENCOUNTER — Ambulatory Visit
Admission: RE | Admit: 2018-05-06 | Discharge: 2018-05-06 | Disposition: A | Discharge status: Home / Self Care | Payer: Medicare HMO | Source: Ambulatory Visit | Attending: Radiation Oncology | Admitting: Radiation Oncology

## 2018-05-06 DIAGNOSIS — Z510 Encounter for antineoplastic radiation therapy: Secondary | ICD-10-CM | POA: Diagnosis not present

## 2018-05-07 ENCOUNTER — Ambulatory Visit
Admission: RE | Admit: 2018-05-07 | Discharge: 2018-05-07 | Disposition: A | Discharge status: Home / Self Care | Payer: Medicare HMO | Source: Ambulatory Visit | Attending: Radiation Oncology | Admitting: Radiation Oncology

## 2018-05-07 DIAGNOSIS — Z510 Encounter for antineoplastic radiation therapy: Secondary | ICD-10-CM | POA: Diagnosis not present

## 2018-05-07 NOTE — Progress Notes (Signed)
Symptoms Management Clinic Progress Note   Barry Booker [id number] 01/22/1946 72 y.o.  Barry Booker is managed by Dr. Sullivan Lone  Actively treated with chemotherapy/immunotherapy: yes  Current Therapy: Concurrent chemoradiation with the patient receiving carboplatin and paclitaxel  Last Treated: 04/28/2018 (cycle 4, day 1)  Assessment: Plan:    Radiation-induced esophagitis  Gastroesophageal reflux disease with esophagitis  Squamous cell lung cancer, right (HCC)   GERD and radiation-induced esophagitis: The patient was reeducated on how to use Carafate.  He was told to dissolve a tablet in a small amount of water then to drink this after the tablet is dissolved.  Additionally the patient was given a GI cocktail today and was given a prescription for Magic mouthwash with lidocaine.  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  He is status post cycle 4, day 1 of carboplatin and paclitaxel which was dosed on 04/28/2018.  He is scheduled to see Dr. Sullivan Lone in follow-up on 05/19/2018.  Please see After Visit Summary for patient specific instructions.  Future Appointments  Date Time Provider [place]  05/07/2018  3:00 PM [facility] LINAC 3 [facility] None  05/08/2018  3:00 PM [facility] LINAC 3 [facility] None  05/09/2018  9:30 AM Kipp Laurence, PT [facility] None  05/14/2018  3:30 PM Gardiner Barefoot, DPM [facility]  05/19/2018 10:00 AM [facility] LAB 6 [facility] None  05/19/2018 10:40 AM Brunetta Genera, MD [facility] None  05/19/2018 11:15 AM Karie Mainland, RD [facility] None    No orders of the defined types were placed in this encounter.      Subjective:   Patient ID:  Barry Booker is a 72 y.o. (DOB 19-Aug-1946) male.  Chief Complaint: No chief complaint on file.  HPI Barry Booker is a 72 year old male with a history of a stage III unresectable squamous  cell carcinoma of the right lung who is managed by Dr. Sullivan Lone and is treated with concurrent chemoradiation.  He is status post cycle 4, day 1 of carboplatin and paclitaxel which was last dosed on 04/28/2018.  The patient is seen today with a report that he has been having ongoing GERD and esophagitis.  He had been given a prescription for Carafate but is been attempting to swallow the pills hold.  He it was discussed with the patient and his wife today that the pills are to be dissolved in water prior to drinking the solution.  He denies fevers, chills, sweats, nausea, or vomiting.  Medications: I have reviewed the patient's current medications.  Allergies:  Allergies  Allergen Reactions  . Neomycin-Bacitracin Zn-Polymyx Rash    Past Medical History:  Diagnosis Date  . Alcohol abuse   . COPD (chronic obstructive pulmonary disease) ([place])   . Diabetes mellitus without complication ([place])   . Gout   . High cholesterol   . Hypertension   . Malignant neoplasm of right upper lobe of lung ([place]) 03/29/2018    Past Surgical History:  Procedure Laterality Date  . COLONOSCOPY    . FLEXIBLE BRONCHOSCOPY [name] 03/21/2018   Procedure: FLEXIBLE BRONCHOSCOPY;  Surgeon: Marshell Garfinkel, MD;  Location: [place];  Service: Pulmonary;  Laterality: [name];  . NOSE SURGERY  90's  . VIDEO BRONCHOSCOPY WITH ENDOBRONCHIAL ULTRASOUND [name] 03/21/2018   Procedure: VIDEO BRONCHOSCOPY WITH ENDOBRONCHIAL ULTRASOUND;  Surgeon: Marshell Garfinkel, MD;  Location: [place];  Service: Pulmonary;  Laterality: [name];    Family History  Problem Relation Age of Onset  . Arthritis Mother   . Cancer Father   . Cancer Sister   . Heart disease Maternal Uncle   . Colon cancer Neg Hx     Social History   Socioeconomic History  . Marital status: Married    Spouse name: Not on file  . Number of children: Not on file  . Years of education: Not on file  . Highest education level: Not on file  Occupational History  . Not on file    Social Needs  . Financial resource strain: Not on file  . Food insecurity:    Worry: Not on file    Inability: Not on file  . Transportation needs:    Medical: Not on file    Non-medical: Not on file  Tobacco Use  . Smoking status: Former Smoker    Packs/day: 0.25    Years: 50.00    Pack years: 12.50    Types: Cigarettes    Last attempt to quit: 07/2017    Years since quitting: 0.8  . Smokeless tobacco: Never Used  . Tobacco comment: quit smoking 07/2017  Substance and Sexual Activity  . Alcohol use: Yes    Alcohol/week: 8.4 - 12.6 oz    Types: 14 - 21 Cans of beer per week  . Drug use: No  . Sexual activity: Not on file  Lifestyle  . Physical activity:    Days per week: Not on file    Minutes per session: Not on file  . Stress: Not on file  Relationships  . Social connections:    Talks on phone: Not on file    Gets together: Not on file    Attends religious service: Not on file    Active member of club or organization: Not on file    Attends meetings of clubs or organizations: Not on file    Relationship status: Not on file  . Intimate partner violence:    Fear of current or ex partner: Not on file    Emotionally abused: Not on file    Physically abused: Not on file    Forced sexual activity: Not on file  Other Topics Concern  . Not on file  Social History Narrative  . Not on file    Past Medical History, Surgical history, Social history, and Family history were reviewed and updated as appropriate.   Please see review of systems for further details on the patient's review from today.   Review of Systems:  Review of Systems  Constitutional: Positive for appetite change. Negative for activity change, chills, diaphoresis, fatigue, fever and unexpected weight change.  HENT: Positive for trouble swallowing.   Respiratory: Negative for cough, choking and shortness of breath.   Cardiovascular: Negative for chest pain.  Gastrointestinal: Negative for nausea and  vomiting.    Objective:   Physical Exam:  There were no vitals taken for this visit. ECOG: 1  Physical Exam  Constitutional: No distress.  HENT:  Head: Normocephalic and atraumatic.  Eyes: Conjunctivae are normal. Right eye exhibits no discharge. Left eye exhibits no discharge. No scleral icterus.  Cardiovascular: Normal rate, regular rhythm and normal heart sounds. Exam reveals no gallop and no friction rub.  No murmur heard. Pulmonary/Chest: Effort normal and breath sounds normal. No stridor. No respiratory distress. He has no wheezes. He has no rales.  Neurological: He is alert.  Skin: Skin is warm and dry. He is not diaphoretic.  Psychiatric: He has a normal  mood and affect. His behavior is normal. Judgment and thought content normal.    Lab Review:     Component Value Date/Time   NA 139 05/05/2018 0837   K 3.7 05/05/2018 0837   CL 107 05/05/2018 0837   CO2 23 05/05/2018 0837   GLUCOSE 153 (H) 05/05/2018 0837   BUN 13 05/05/2018 0837   CREATININE 0.83 05/05/2018 0837   CREATININE 0.88 02/09/2016 1014   CALCIUM 8.8 (L) 05/05/2018 0837   PROT 6.4 (L) 05/05/2018 0837   ALBUMIN 2.8 (L) 05/05/2018 0837   AST 11 (L) 05/05/2018 0837   ALT 13 05/05/2018 0837   ALKPHOS 65 05/05/2018 0837   BILITOT 0.5 05/05/2018 0837   GFRNONAA >60 05/05/2018 0837   GFRAA >60 05/05/2018 0837       Component Value Date/Time   WBC 3.1 (L) 05/05/2018 0837   RBC 3.81 (L) 05/05/2018 0837   HGB 10.4 (L) 05/05/2018 0837   HGB 10.2 (L) 04/07/2018 0855   HCT 32.7 (L) 05/05/2018 0837   PLT 86 (L) 05/05/2018 0837   PLT 308 04/07/2018 0855   MCV 85.8 05/05/2018 0837   MCH 27.3 05/05/2018 0837   MCHC 31.8 (L) 05/05/2018 0837   RDW 16.5 (H) 05/05/2018 0837   LYMPHSABS 0.3 (L) 05/05/2018 0837   MONOABS 0.3 05/05/2018 0837   EOSABS 0.0 05/05/2018 0837   BASOSABS 0.0 05/05/2018 0837   -------------------------------  Imaging from last 24 hours (if applicable):  Radiology  interpretation: No results found.

## 2018-05-08 ENCOUNTER — Other Ambulatory Visit: Payer: Self-pay | Admitting: Radiation Oncology

## 2018-05-08 ENCOUNTER — Ambulatory Visit
Admission: RE | Admit: 2018-05-08 | Discharge: 2018-05-08 | Disposition: A | Discharge status: Home / Self Care | Payer: Medicare HMO | Source: Ambulatory Visit | Attending: Radiation Oncology | Admitting: Radiation Oncology

## 2018-05-08 ENCOUNTER — Encounter: Payer: Self-pay | Admitting: Radiation Oncology

## 2018-05-08 DIAGNOSIS — Z510 Encounter for antineoplastic radiation therapy: Secondary | ICD-10-CM | POA: Diagnosis not present

## 2018-05-08 DIAGNOSIS — C3411 Malignant neoplasm of upper lobe, right bronchus or lung: Secondary | ICD-10-CM

## 2018-05-08 MED ORDER — LIDOCAINE VISCOUS HCL 2 % MT SOLN: 15.0000 mL | OROMUCOSAL | 1 refills | Status: DC | PRN

## 2018-05-08 MED ORDER — NYSTATIN 100000 UNIT/ML MT SUSP: 5.0000 mL | Freq: Four times a day (QID) | OROMUCOSAL | 0 refills | Status: DC

## 2018-05-08 MED ORDER — SONAFINE EX EMUL
1.0000 "application " | Freq: Once | CUTANEOUS | Status: AC
  Administered 2018-05-08: 1 via TOPICAL

## 2018-05-08 NOTE — Progress Notes (Signed)
Pt was seen for PUT for EOT and is having esophagitis symptoms despite carafate. He cannot afford the miracle mouth wash and instead I've sent in viscous lidocaine and nystatin suspension.     Carola Rhine, PAC

## 2018-05-09 ENCOUNTER — Encounter: Payer: Self-pay | Admitting: Physical Therapy

## 2018-05-09 ENCOUNTER — Ambulatory Visit: Payer: Medicare HMO | Attending: Hematology | Admitting: Physical Therapy

## 2018-05-09 DIAGNOSIS — M6281 Muscle weakness (generalized): Secondary | ICD-10-CM | POA: Diagnosis present

## 2018-05-09 DIAGNOSIS — R293 Abnormal posture: Secondary | ICD-10-CM | POA: Diagnosis present

## 2018-05-09 DIAGNOSIS — R296 Repeated falls: Secondary | ICD-10-CM | POA: Insufficient documentation

## 2018-05-09 NOTE — Therapy (Signed)
Barry Booker, [place], [address] Phone: [phone]   Fax:  [phone]  Physical Therapy Evaluation  Patient Details  Name: Barry Booker MRN: [id number] Date of Birth: 01-20-1946 Referring Provider: Dr. Irene Booker    Encounter Date: 05/09/2018  PT End of Session - 05/09/18 1315    Visit Number  1    Number of Visits  9    Date for PT Re-Evaluation  06/09/18    PT Start Time  0930    PT Stop Time  1015    PT Time Calculation (min)  45 min    Activity Tolerance  Patient tolerated treatment well       Past Medical History:  Diagnosis Date  . Alcohol abuse   . COPD (chronic obstructive pulmonary disease) ([place])   . Diabetes mellitus without complication ([place])   . Gout   . High cholesterol   . Hypertension   . Malignant neoplasm of right upper lobe of lung ([place]) 03/29/2018    Past Surgical History:  Procedure Laterality Date  . COLONOSCOPY    . FLEXIBLE BRONCHOSCOPY [name] 03/21/2018   Procedure: FLEXIBLE BRONCHOSCOPY;  Surgeon: Barry Garfinkel, MD;  Location: [facility];  Service: Pulmonary;  Laterality: [name];  . NOSE SURGERY  90's  . VIDEO BRONCHOSCOPY WITH ENDOBRONCHIAL ULTRASOUND [name] 03/21/2018   Procedure: VIDEO BRONCHOSCOPY WITH ENDOBRONCHIAL ULTRASOUND;  Surgeon: Barry Garfinkel, MD;  Location: [facility];  Service: Pulmonary;  Laterality: [name];    There were no vitals filed for this visit.   Subjective Assessment - 05/09/18 0938    Subjective  Pt states she has gotten to the point where "doesn't do nothin"  He says he has had 2 falls recently.  He has trouble with indigestion     Patient is accompained by:  Family member   wife Barry Booker    Pertinent History  stage III C lung cancer He has had radiation and chemosince June no surgeries other than biopsy     Currently in Pain?  No/denies   ingestion         [facility] PT Assessment - 05/09/18 0001      Assessment   Medical Diagnosis  lung cancer      Referring Provider  Dr. Irene Booker     Onset Date/Surgical Date  03/01/18      Restrictions   Weight Bearing Restrictions  No      Balance Screen   Has the patient fallen in the past 6 months  Yes    How many times?  2   pt feels everything just went blank(happened before treatmen   Has the patient had a decrease in activity level because of a fear of falling?   Yes    Is the patient reluctant to leave their home because of a fear of falling?   No      Home Environment   Living Environment  Private residence    Living Arrangements  Spouse/significant other    Available Help at Discharge  Available 24 hours/day    Type of Ages to enter    Entrance Stairs-Number of Steps  varies between 2 and 6 with rails on all entances     Additional Comments  Pt has a shower in tub and had trouble getting his leg up and over the tub       Prior Function   Level of Independence  Independent  Vocation  Retired    Leisure  pt says he wants to get stronger and is willing to start an exercise program       Cognition   Overall Cognitive Status  Within Functional Limits for tasks assessed      Functional Tests   Functional tests  Sit to Stand      Sit to Stand   Comments  8 repetitions in 30 sec       Posture/Postural Control   Posture/Postural Control  Postural limitations    Postural Limitations  Rounded Shoulders;Forward head;Decreased lumbar lordosis      ROM / Strength   AROM / PROM / Strength  AROM;Strength      AROM   Overall AROM   Within functional limits for tasks performed      Strength   Overall Strength  Deficits    Overall Strength Comments  Pt appears to have generalized deconditioning,  He states has has lost 40# recently    Right Hand Grip (lbs)  60/60/60    Left Hand Grip (lbs)  52/55/53      Ambulation/Gait   Gait velocity  .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              PT Education - 05/09/18 1314    Education Details  repeated sit to stand and glute sets during TV commercials, try standing on one leg for a count of 10 holding onto kitchen counter, make an effort to start walking more at home     Person(s) Educated  Patient;Spouse    Methods  Explanation    Comprehension  Verbalized understanding          PT Long Term Goals - 05/09/18 1325      PT LONG TERM GOAL #1   Title  Pt will be independent in a home program for strength and endurance     Time  4    Period  Weeks    Status  New      PT LONG TERM GOAL #2   Title  Pt will decrease TUG score to < 10 indicating a decrased risk for falls     Baseline  14.30    Time  4    Period  Weeks    Status  New             Plan - 05/09/18 1316    Clinical Impression Statement  [name] is a 72 yo male undergoing treatment for lung cancer.  He appears to be deconditioned and has some recent falls that his wife says may be because he was dehydrated.  He is walking without a device and shows some impairment in balance and gait velocity.  His grip strength is fairly good.  I feel he would benefit from an exercise program to increase strength, maintain function and decrease risk of falls.  He does not want to agree to coming to PT at this time, but will call  if he wants to schedule appointment.s     History and Personal Factors relevant to plan of care:  undergoing chemo for lung cancer , recent falls     Clinical Presentation  Evolving    Clinical Presentation due to:  ongoing chemo     Clinical Decision Making  Moderate    Rehab Potential  Good    PT Frequency  2x / week    PT Duration  4  weeks    PT Treatment/Interventions  ADLs/Self Care Home Management;Gait training;Stair training;Therapeutic activities;Therapeutic exercise;Functional mobility training;Balance training;Patient/family education    PT Next Visit Plan  Initate exercise for leg and general strength and balance, upgrade HEP     PT Home Exercise Plan  see education section     Consulted and Agree with Plan of Care  Patient       Patient will benefit from skilled therapeutic intervention in order to improve the following deficits and impairments:  Abnormal gait, Postural dysfunction, Decreased mobility, Decreased activity tolerance, Decreased endurance, Decreased strength, Decreased balance  Visit Diagnosis: Abnormal posture - Plan: PT plan of care cert/re-cert  Muscle weakness (generalized) - Plan: PT plan of care cert/re-cert  Recurrent falls - Plan: PT plan of care cert/re-cert     Problem List Patient Active Problem List   Diagnosis Date Noted  . Squamous cell lung cancer, right ([place]) 04/04/2018  . Counseling regarding advance care planning and goals of care 04/04/2018  . Malignant neoplasm of right upper lobe of lung ([place]) 03/29/2018  . Anorexia nervosa   . Airway obstruction, anatomic   . Loss of weight   . Respiratory distress 03/21/2018  . Lung mass 03/14/2018  . Chronic diastolic CHF (congestive heart failure) ([place]) 02/09/2016  . History of pulmonary embolism 02/09/2016  . Fecal occult blood test positive 07/05/2015  . Local edema 06/28/2015  . Type 2 diabetes mellitus ([place]) 01/07/2015  . Excessive urination at night 06/22/2014  . Encounter for screening for cardiovascular disorders 01/29/2014  . Adiposity 01/19/2014  . Chronic obstructive pulmonary disease ([place]) 07/07/2013  . Hypertensive heart disease 06/22/2013  . Disease of skin and subcutaneous tissue 12/25/2012  . Disease of nail 11/25/2012  . Encounter for screening for eye and ear disorders 07/29/2012  . Abnormal blood  chemistry 12/10/2011  . 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 Nondependent alcohol abuse 06/05/2011  . Chronic nonalcoholic liver disease 53/64/6803  . Thrombocytopenia ([place]) 07/12/2010  . ABNORMAL ABDOMINAL ULTRASOUND 09/14/2008  . HLD (hyperlipidemia) 09/08/2007  . ALCOHOL ABUSE 02/25/2007  . TOBACCO ABUSE 02/25/2007  . INSOMNIA 02/25/2007  . DEPENDENCE, ALCOHOL NEC/NOS, IN REMISSION 02/19/2007  . HYPERTENSION 02/19/2007  . COPD 02/19/2007   Donato Heinz. Owens Shark PT  Norwood Levo 05/09/2018, 1:28 PM  [place], [place], [address] Phone: [phone]   Fax:  [phone]  Name: Barry Booker MRN: [id number] Date of Birth: 10/17/1945

## 2018-05-12 ENCOUNTER — Other Ambulatory Visit: Payer: Medicare HMO

## 2018-05-12 ENCOUNTER — Ambulatory Visit: Payer: Medicare HMO

## 2018-05-14 ENCOUNTER — Ambulatory Visit: Payer: Medicare HMO | Admitting: Podiatry

## 2018-05-14 ENCOUNTER — Encounter: Payer: Self-pay | Admitting: Podiatry

## 2018-05-14 DIAGNOSIS — B351 Tinea unguium: Secondary | ICD-10-CM

## 2018-05-14 DIAGNOSIS — E119 Type 2 diabetes mellitus without complications: Secondary | ICD-10-CM

## 2018-05-14 DIAGNOSIS — M79676 Pain in unspecified toe(s): Secondary | ICD-10-CM

## 2018-05-14 NOTE — Progress Notes (Signed)
Patient ID: Barry Booker, male   DOB: 1946/02/22, 72 y.o.   MRN: [id number] Complaint:  Visit Type: Patient returns to my office for continued preventative foot care services. Complaint: Patient states" my nails have grown long and thick and become painful to walk and wear shoes" . The patient presents for preventative foot care services. No changes to ROS  Podiatric Exam: Vascular: dorsalis pedis and posterior tibial pulses are palpable bilateral. Capillary return is immediate. Temperature gradient is WNL. Skin turgor WNL  Sensorium: Normal Semmes Weinstein monofilament test. Normal tactile sensation bilaterally. Nail Exam: Pt has thick disfigured discolored nails with subungual debris noted bilateral entire nail hallux through fifth toenails Ulcer Exam: There is no evidence of ulcer or pre-ulcerative changes or infection. Orthopedic Exam: Muscle tone and strength are WNL. No limitations in general ROM. No crepitus or effusions noted. Foot type and digits show no abnormalities. Bony prominences are unremarkable. Skin: No Porokeratosis. No infection or ulcers  Diagnosis:  Onychomycosis, , Pain in right toe, pain in left toes  Treatment & Plan Procedures and Treatment: Consent by patient was obtained for treatment procedures. The patient understood the discussion of treatment and procedures well. All questions were answered thoroughly reviewed. Debridement of mycotic and hypertrophic toenails, 1 through 5 bilateral and clearing of subungual debris. No ulceration, no infection noted.  Return Visit-Office Procedure: Patient instructed to return to the office for a follow up visit 3 months for continued evaluation and treatment.  Gardiner Barefoot DPM

## 2018-05-16 NOTE — Progress Notes (Signed)
[name]    HEMATOLOGY/ONCOLOGY CLINIC NOTE  Date of Service: 05/19/2018  Patient Care Team: Bernerd Limbo, MD as PCP - General (Family Medicine)  CHIEF COMPLAINTS/PURPOSE OF CONSULTATION:  F/u for mx of lung cancer  HISTORY OF PRESENTING ILLNESS:   Barry Booker is a wonderful 72 y.o. male who has been referred to [place] by Dr Marshell Garfinkel for evaluation and management of presumed newly diagnosed rt sided lung cancer with concern for bronchial obstruction.  Patient has a history of hypertension, dyslipidemia, diabetes, COPD, alcohol abuse and previously had a chest x-ray on 02/20/2018 at [facility] which showed Right upper lobe consolidation/atelectasis may relate to post obstructive pneumonitis/pneumonia. An underlying obstructing lesion should be considered. Consider CT for further assessment.  H subsequently then had a CT of the chest on 02/27/2018 which showed -5.8 x 4.9 cm mass in the right mediastinum and hilum with associated severe narrowing of the right main pulmonary artery as well as right upper lobe and right middle lobe arteries. Obstructed right main stem bronchus and collapse of the right upper lobe and right middle lobe. Small right effusion. The appearance is similar to the patient's chest radiograph from 02/20/2018.  Patient subsequently had a PET CT on 03/10/2018 by Dr. Vaughan Browner which showed Large intensely hypermetabolic RIGHT hilar mass obstructs the RIGHT upper lobe bronchus and surrounds the bronchus intermedius. Post obstructive collapse of the RIGHT upper lobe.  No hypermetabolic mediastinal or supraclavicular lymph nodes. No evidence distant metastatic disease.  Patient had a flexible video fiberoptic bronchoscopy done on 03/21/2018 by Dr. Vaughan Browner which showed which showed some nodularity of the right mainstem bronchial mucosa with near complete occlusion of right main stem. Unable to traverse the occluded part of right main stem. The mucosa was friable with propensity to bleed.  Endobronchial biopsies and brushings were taken from the R mainstem and right upper lobe for pathology.  Preliminary results showed atypical cells however final cytology/pathology is currently pending.  Patient report rt sided chest pain with deep inspiration, anorexia and weight loss of about 30lbs in the last 6 months.  Interval History:   Barry Booker returns to clinic today for next dose of carbo/taxol chemotherapy.The patient's last visit with [place] was on 04/21/18. He is accompanied today by his partner. The pt reports that he is doing well overall.   The pt completed his RT on 05/08/18. The pt reports that he has some minor difficulty swallowing his saliva which is accompanied by a slight discomfort. The pt notes that he has tried using Sucralfate which he doesn't think as helped. The pt denies pain or difficulty swallowing food and endorses eating well.  He denies consuming nutritional supplements but has these available. His weight has been stable for the last month.    The pt went to PT once in the interim and has decided to pursue the exercises he learned there at home.   The pt denies any leg swelling and reports that his BP has been good in the interim.   Lab results today (05/19/18) of CBC w/diff, CMP is as follows: all values are WNL except for RBC at 4.05, HGB at 11.4, HCT at 35.9, MCHC at 31.8, RDW at 18.8, PLT at 122k, Lymphs abs at 500, Glucose at 160, Albumin at 3.0, AST at 14. Magnesium 05/19/18 is at 1.8 Phosphorous 05/19/18 is at 3.3  On review of systems, pt reports stable weight, eating well, some throat discomfort, stable energy levels, and denies acid reflux, leg swelling, abdominal  pains, constipation, and any other symptoms.   MEDICAL HISTORY:  Past Medical History:  Diagnosis Date  . Alcohol abuse   . COPD (chronic obstructive pulmonary disease) ([place])   . Diabetes mellitus without complication ([place])   . Gout   . High cholesterol   . Hypertension   . Malignant  neoplasm of right upper lobe of lung ([place]) 03/29/2018    SURGICAL HISTORY: Past Surgical History:  Procedure Laterality Date  . COLONOSCOPY    . FLEXIBLE BRONCHOSCOPY [name] 03/21/2018   Procedure: FLEXIBLE BRONCHOSCOPY;  Surgeon: Marshell Garfinkel, MD;  Location: [facility];  Service: Pulmonary;  Laterality: [name];  . NOSE SURGERY  90's  . VIDEO BRONCHOSCOPY WITH ENDOBRONCHIAL ULTRASOUND [name] 03/21/2018   Procedure: VIDEO BRONCHOSCOPY WITH ENDOBRONCHIAL ULTRASOUND;  Surgeon: Marshell Garfinkel, MD;  Location: [facility];  Service: Pulmonary;  Laterality: [name];    SOCIAL HISTORY: Social History   Socioeconomic History  . Marital status: Married    Spouse name: Not on file  . Number of children: Not on file  . Years of education: Not on file  . Highest education level: Not on file  Occupational History  . Not on file  Social Needs  . Financial resource strain: Not on file  . Food insecurity:    Worry: Not on file    Inability: Not on file  . Transportation needs:    Medical: Not on file    Non-medical: Not on file  Tobacco Use  . Smoking status: Former Smoker    Packs/day: 0.25    Years: 50.00    Pack years: 12.50    Types: Cigarettes    Last attempt to quit: 07/2017    Years since quitting: 0.8  . Smokeless tobacco: Never Used  . Tobacco comment: quit smoking 07/2017  Substance and Sexual Activity  . Alcohol use: Yes    Alcohol/week: 14.0 - 21.0 standard drinks    Types: 14 - 21 Cans of beer per week  . Drug use: No  . Sexual activity: Not on file  Lifestyle  . Physical activity:    Days per week: Not on file    Minutes per session: Not on file  . Stress: Not on file  Relationships  . Social connections:    Talks on phone: Not on file    Gets together: Not on file    Attends religious service: Not on file    Active member of club or organization: Not on file    Attends meetings of clubs or organizations: Not on file    Relationship status: Not on file  . Intimate partner  violence:    Fear of current or ex partner: Not on file    Emotionally abused: Not on file    Physically abused: Not on file    Forced sexual activity: Not on file  Other Topics Concern  . Not on file  Social History Narrative  . Not on file    FAMILY HISTORY: Family History  Problem Relation Age of Onset  . Arthritis Mother   . Cancer Father   . Cancer Sister   . Heart disease Maternal Uncle   . Colon cancer Neg Hx     ALLERGIES:  is allergic to neomycin-bacitracin zn-polymyx.  MEDICATIONS:  Current Outpatient Medications  Medication Sig Dispense Refill  . aspirin 81 MG tablet Take 81 mg by mouth daily.    [name] atorvastatin (LIPITOR) 40 MG tablet Take 40 mg by mouth daily.     [name]  folic acid (FOLVITE) 741 MCG tablet Take 400 mcg by mouth daily.    . Ipratropium-Albuterol (COMBIVENT IN) Inhale 2 puffs into the lungs every 6 (six) hours as needed (for shortness of breath/wheezing.).    [name] lidocaine (XYLOCAINE) 2 % solution Use as directed 15 mLs in the mouth or throat every 4 (four) hours as needed (esophagitis). Swish and swallow 100 mL 1  . potassium chloride SA (K-DUR,KLOR-CON) 20 MEQ tablet Take 20 mEq by mouth daily.     . sucralfate (CARAFATE) 1 g tablet Take 1 tablet (1 g total) by mouth 4 (four) times daily -  with meals and at bedtime. 5 min before meals for radiation induced esophagitis 120 tablet 2  . umeclidinium-vilanterol (ANORO ELLIPTA) 62.5-25 MCG/INH AEPB Inhale 1 puff into the lungs daily.     . ondansetron (ZOFRAN) 8 MG tablet Take 1 tablet (8 mg total) by mouth 2 (two) times daily as needed for refractory nausea / vomiting. Start on day 3 after chemo. (Patient not taking: Reported on 05/19/2018) 30 tablet 1  . prochlorperazine (COMPAZINE) 10 MG tablet Take 1 tablet (10 mg total) by mouth every 6 (six) hours as needed (Nausea or vomiting). (Patient not taking: Reported on 05/19/2018) 30 tablet 1   No current facility-administered medications for this visit.      REVIEW OF SYSTEMS:    A 10+ POINT REVIEW OF SYSTEMS WAS OBTAINED including neurology, dermatology, psychiatry, cardiac, respiratory, lymph, extremities, GI, GU, Musculoskeletal, constitutional, breasts, reproductive, HEENT.  All pertinent positives are noted in the HPI.  All others are negative.   PHYSICAL EXAMINATION: ECOG PERFORMANCE STATUS: 1 - Symptomatic but completely ambulatory  Vitals:   05/19/18 1043  BP: 119/85  Pulse: 99  Resp: 18  Temp: 97.8 F (36.6 C)  SpO2: 100%   Filed Weights   05/19/18 1043  Weight: 195 lb 4.8 oz (88.6 kg)   .Body mass index is 28.02 kg/m.  GENERAL:alert, in no acute distress and comfortable SKIN: no acute rashes, no significant lesions EYES: conjunctiva are pink and non-injected, sclera anicteric OROPHARYNX: MMM, no exudates, no oropharyngeal erythema or ulceration NECK: supple, no JVD LYMPH:  no palpable lymphadenopathy in the cervical, axillary or inguinal regions LUNGS: decreased air entry Rt upper and mid zones HEART: regular rate & rhythm ABDOMEN:  normoactive bowel sounds , non tender, not distended. No palpable hepatosplenomegaly.  Extremity: no pedal edema PSYCH: alert & oriented x 3 with fluent speech NEURO: no focal motor/sensory deficits   LABORATORY DATA:  I have reviewed the data as listed . CBC Latest Ref Rng & Units 05/19/2018 05/05/2018 04/28/2018  WBC 4.0 - 10.3 K/uL 5.1 3.1(L) 3.5(L)  Hemoglobin 13.0 - 17.1 g/dL 11.4(L) 10.4(L) 11.0(L)  Hematocrit 38.4 - 49.9 % 35.9(L) 32.7(L) 33.4(L)  Platelets 140 - 400 K/uL 122(L) 86(L) 135(L)    . CMP Latest Ref Rng & Units 05/19/2018 05/05/2018 04/28/2018  Glucose 70 - 99 mg/dL 160(H) 153(H) 185(H)  BUN 8 - 23 mg/dL _0 Creatinine 0.61 - 1.24 mg/dL 0.96 0.83 0.85  Sodium 135 - 145 mmol/L 141 139 139  Potassium 3.5 - 5.1 mmol/L 5.0 3.7 4.0  Chloride 98 - 111 mmol/L 106 107 107  CO2 22 - 32 mmol/L _1 Calcium 8.9 - 10.3 mg/dL 9.3 8.8(L) 9.0  Total Protein 6.5 -  8.1 g/dL 6.9 6.4(L) 6.6  Total Bilirubin 0.3 - 1.2 mg/dL 0.4 0.5 0.4  Alkaline Phos 38 - 126 U/L 77 65 65  AST 15 - 41 U/L 14(L) 11(L) 13(L)  ALT 0 - 44 U/L _0 03/21/18   03/25/18 Endobronchial bx:     RADIOGRAPHIC STUDIES: I have personally reviewed the radiological images as listed and agreed with the findings in the report. No results found.  ASSESSMENT & PLAN:   72 y.o.  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 Post obstructive collapse of the Right upper lobe. No hypermetabolic mediastinal or supraclavicular lymph nodes. No evidence of distant mets.   2) RUL airway obstruction with RUL lung collapse. 3) Concern for severe narrowing of the right main pulmonary artery as well as right upper lobe and right middle lobe arteries.   PLAN:  -Discussed pt labwork today, 05/19/18; HGB increased to 11.4, PLT increased to 122k, ANC at 4.2k. Blood chemistries are stable.  -Discussed the recommendation to begin maintenance immunotherapy if no overt evidence of disease progression on rpt imaging. -Pt prefers not to have medication for his throat discomfort, he will let me know if this worsens -Continue drinking at least 48-64 oz of water each day -Continue eating well and consume nutritional supplements -Will have PET in 5 weeks -Advised that the pt follow up with his PCP for management of his BP  -Will see the pt back in 6 weeks    PET/CT in 5 weeks RTC with Dr Irene Limbo in 6 weeks with labs    All of the patients questions were answered with apparent satisfaction. The patient knows to call the clinic  with any problems, questions or concerns.  The total time spent in the appt was 25 minutes and more than 50% was on counseling and direct patient cares.     Sullivan Lone MD MS AAHIVMS [facility] Hematology/Oncology Physician [facility]  (Office):       [phone] (Work cell):  [phone] (Fax):           [phone]  I, Baldwin Jamaica, am acting as a scribe for Dr. Irene Limbo  .I have reviewed the above documentation for accuracy and completeness, and I agree with the above. Brunetta Genera MD

## 2018-05-19 ENCOUNTER — Other Ambulatory Visit: Payer: Medicare HMO

## 2018-05-19 ENCOUNTER — Encounter: Payer: Self-pay | Admitting: Hematology

## 2018-05-19 ENCOUNTER — Ambulatory Visit: Payer: Medicare HMO

## 2018-05-19 ENCOUNTER — Inpatient Hospital Stay: Payer: Medicare HMO

## 2018-05-19 ENCOUNTER — Inpatient Hospital Stay (HOSPITAL_BASED_OUTPATIENT_CLINIC_OR_DEPARTMENT_OTHER): Payer: Medicare HMO | Admitting: Hematology

## 2018-05-19 ENCOUNTER — Inpatient Hospital Stay: Payer: Medicare HMO | Admitting: Nutrition

## 2018-05-19 VITALS — BP 119/85 | HR 99 | Temp 97.8°F | Resp 18 | Ht 70.0 in | Wt 195.3 lb

## 2018-05-19 DIAGNOSIS — K208 Other esophagitis: Secondary | ICD-10-CM | POA: Diagnosis not present

## 2018-05-19 DIAGNOSIS — C3401 Malignant neoplasm of right main bronchus: Secondary | ICD-10-CM

## 2018-05-19 DIAGNOSIS — F1010 Alcohol abuse, uncomplicated: Secondary | ICD-10-CM

## 2018-05-19 DIAGNOSIS — Y842 Radiological procedure and radiotherapy as the cause of abnormal reaction of the patient, or of later complication, without mention of misadventure at the time of the procedure: Secondary | ICD-10-CM | POA: Diagnosis not present

## 2018-05-19 DIAGNOSIS — J9819 Other pulmonary collapse: Secondary | ICD-10-CM

## 2018-05-19 DIAGNOSIS — K210 Gastro-esophageal reflux disease with esophagitis: Secondary | ICD-10-CM | POA: Diagnosis not present

## 2018-05-19 DIAGNOSIS — J449 Chronic obstructive pulmonary disease, unspecified: Secondary | ICD-10-CM

## 2018-05-19 DIAGNOSIS — C3411 Malignant neoplasm of upper lobe, right bronchus or lung: Secondary | ICD-10-CM

## 2018-05-19 DIAGNOSIS — J988 Other specified respiratory disorders: Secondary | ICD-10-CM

## 2018-05-19 DIAGNOSIS — C3491 Malignant neoplasm of unspecified part of right bronchus or lung: Secondary | ICD-10-CM

## 2018-05-19 DIAGNOSIS — I10 Essential (primary) hypertension: Secondary | ICD-10-CM

## 2018-05-19 DIAGNOSIS — E119 Type 2 diabetes mellitus without complications: Secondary | ICD-10-CM

## 2018-05-19 LAB — CBC WITH DIFFERENTIAL/PLATELET
BASOS PCT: 0 %
Basophils Absolute: 0 10*3/uL (ref 0.0–0.1)
EOS ABS: 0 10*3/uL (ref 0.0–0.5)
Eosinophils Relative: 1 %
HEMATOCRIT: 35.9 % — AB (ref 38.4–49.9)
HEMOGLOBIN: 11.4 g/dL — AB (ref 13.0–17.1)
LYMPHS ABS: 0.5 10*3/uL — AB (ref 0.9–3.3)
Lymphocytes Relative: 9 %
MCH: 28.1 pg (ref 27.2–33.4)
MCHC: 31.8 g/dL — AB (ref 32.0–36.0)
MCV: 88.6 fL (ref 79.3–98.0)
MONOS PCT: 8 %
Monocytes Absolute: 0.4 10*3/uL (ref 0.1–0.9)
NEUTROS ABS: 4.2 10*3/uL (ref 1.5–6.5)
NEUTROS PCT: 82 %
Platelets: 122 10*3/uL — ABNORMAL LOW (ref 140–400)
RBC: 4.05 MIL/uL — AB (ref 4.20–5.82)
RDW: 18.8 % — ABNORMAL HIGH (ref 11.0–14.6)
WBC: 5.1 10*3/uL (ref 4.0–10.3)

## 2018-05-19 LAB — CMP (CANCER CENTER ONLY)
ALT: 13 U/L (ref 0–44)
ANION GAP: 7 (ref 5–15)
AST: 14 U/L — ABNORMAL LOW (ref 15–41)
Albumin: 3 g/dL — ABNORMAL LOW (ref 3.5–5.0)
Alkaline Phosphatase: 77 U/L (ref 38–126)
BUN: 9 mg/dL (ref 8–23)
CO2: 28 mmol/L (ref 22–32)
Calcium: 9.3 mg/dL (ref 8.9–10.3)
Chloride: 106 mmol/L (ref 98–111)
Creatinine: 0.96 mg/dL (ref 0.61–1.24)
Glucose, Bld: 160 mg/dL — ABNORMAL HIGH (ref 70–99)
Potassium: 5 mmol/L (ref 3.5–5.1)
Sodium: 141 mmol/L (ref 135–145)
Total Bilirubin: 0.4 mg/dL (ref 0.3–1.2)
Total Protein: 6.9 g/dL (ref 6.5–8.1)

## 2018-05-19 LAB — PHOSPHORUS: PHOSPHORUS: 3.3 mg/dL (ref 2.5–4.6)

## 2018-05-19 LAB — MAGNESIUM: MAGNESIUM: 1.8 mg/dL (ref 1.7–2.4)

## 2018-05-19 NOTE — Progress Notes (Signed)
Nutrition follow-up completed with patient and wife after MD visit. Weight is stable and documented as 195.3 pounds August 19. Labs include magnesium 1.8, glucose 160, and albumin 3.0. Patient denies nausea and vomiting. He reports increased throat pain in the evening when he goes to bed.  He also complains of indigestion.  Nutrition diagnosis: Food and nutrition related knowledge deficit resolved  Educated patient and wife tocontinue strategies for managing pain and indigestion.  Encouraged him to continue striving for weight maintenance.   Recommended to call me with any questions or concerns.  **Disclaimer: This note was dictated with voice recognition software. Similar sounding words can inadvertently be transcribed and this note may contain transcription errors which may not have been corrected upon publication of note.**

## 2018-06-17 NOTE — Progress Notes (Signed)
  Radiation Oncology         [phone] ________________________________  Name: Barry Booker MRN: [id number]  Date: 05/08/2018  DOB: 28-Sep-1946  End of Treatment Note  Diagnosis:  72 y.o. 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 The patient then received a cone down boost treatment for an additional 60 Gy in 30 fractions. This yielded a final total dose of 66 Gy.   Narrative: The patient tolerated radiation treatment relatively well.   The patient did experience esophagitis during the course of treatment which required management with Carafate, nystatin, and lidocaine. The patient was not able to begin using magic mouthwash due to cost constraints.  Plan: The patient has completed radiation treatment. The patient will return to radiation oncology clinic for routine followup in one month. I advised the patient to call or return sooner if they have any questions or concerns related to their recovery or treatment. ________________________________  Jodelle Gross, MD, PhD  This document serves as a record of services personally performed by Kyung Rudd, MD. It was created on his behalf by Rae Lips, a trained medical scribe. The creation of this record is based on the scribe's personal observations and the provider's statements to them. This document has been checked and approved by the attending provider.

## 2018-06-23 ENCOUNTER — Encounter (HOSPITAL_COMMUNITY)
Admission: RE | Admit: 2018-06-23 | Discharge: 2018-06-23 | Disposition: A | Discharge status: Home / Self Care | Payer: Medicare HMO | Source: Ambulatory Visit | Attending: Hematology | Admitting: Hematology

## 2018-06-23 DIAGNOSIS — C3411 Malignant neoplasm of upper lobe, right bronchus or lung: Secondary | ICD-10-CM | POA: Diagnosis present

## 2018-06-23 LAB — GLUCOSE, CAPILLARY: GLUCOSE-CAPILLARY: 129 mg/dL — AB (ref 70–99)

## 2018-06-23 MED ORDER — FLUDEOXYGLUCOSE F - 18 (FDG) INJECTION
9.6000 | Freq: Once | INTRAVENOUS | Status: AC | PRN
  Administered 2018-06-23: 9.6 via INTRAVENOUS

## 2018-06-25 ENCOUNTER — Other Ambulatory Visit: Payer: Self-pay

## 2018-06-25 ENCOUNTER — Encounter: Payer: Self-pay | Admitting: Radiation Oncology

## 2018-06-25 ENCOUNTER — Ambulatory Visit
Admission: RE | Admit: 2018-06-25 | Discharge: 2018-06-25 | Disposition: A | Discharge status: Home / Self Care | Payer: Medicare HMO | Source: Ambulatory Visit | Attending: Radiation Oncology | Admitting: Radiation Oncology

## 2018-06-25 VITALS — BP 147/87 | HR 91 | Temp 98.5°F | Resp 20 | Ht 70.0 in | Wt 208.8 lb

## 2018-06-25 DIAGNOSIS — Z79899 Other long term (current) drug therapy: Secondary | ICD-10-CM | POA: Diagnosis not present

## 2018-06-25 DIAGNOSIS — C3411 Malignant neoplasm of upper lobe, right bronchus or lung: Secondary | ICD-10-CM

## 2018-06-25 DIAGNOSIS — Z7982 Long term (current) use of aspirin: Secondary | ICD-10-CM | POA: Insufficient documentation

## 2018-06-25 DIAGNOSIS — C3490 Malignant neoplasm of unspecified part of unspecified bronchus or lung: Secondary | ICD-10-CM | POA: Diagnosis present

## 2018-06-25 DIAGNOSIS — Z7984 Long term (current) use of oral hypoglycemic drugs: Secondary | ICD-10-CM | POA: Insufficient documentation

## 2018-06-25 NOTE — Progress Notes (Signed)
Radiation Oncology         [phone] ________________________________  Name: Barry Booker MRN: [id number]  Date of Service: 06/25/2018 DOB: 1945/10/13  Post Treatment Note  CC: Bernerd Limbo, MD  Marshell Garfinkel, MD  Diagnosis:   At least Stage III, NSCLC, squamous cell carcinoma of the right hilar region  Interval Since Last Radiation:  7 weeks   03/24/2018 - 05/08/2018: The patient was treated to the disease within the RUL lung initially to a dose of 6 Gy in 3 fractions using a 4 field, 3-D conformal technique. The patient then received a cone down boost treatment for an additional 60 Gy in 30 fractions. This yielded a final total dose of 66 Gy.   Narrative:  The patient returns today for routine follow-up.  The patient tolerated radiotherapy well, however he did have symptoms of esophagitis that were difficult towards the end of therapy requiring viscous lidocaine.  He reports his symptoms have essentially resolved, and he is able to eat and drink what he enjoys.  He denies any shortness of breath, and has an occasional cough with clear to white productive mucus.  He denies any fevers, or a change in his breathing, he states that he is not experiencing any headaches or blurred vision.  No other complaints are verbalized.                              ALLERGIES:  is allergic to neomycin-bacitracin zn-polymyx.  Meds: Current Outpatient Medications  Medication Sig Dispense Refill  . aspirin 81 MG tablet Take 81 mg by mouth daily.    [name] atorvastatin (LIPITOR) 40 MG tablet Take 40 mg by mouth daily.     . folic acid (FOLVITE) 737 MCG tablet Take 400 mcg by mouth daily.    . Ipratropium-Albuterol (COMBIVENT IN) Inhale 2 puffs into the lungs every 6 (six) hours as needed (for shortness of breath/wheezing.).    [name] metFORMIN (GLUCOPHAGE) 500 MG tablet Take 500 mg by mouth every morning.    . potassium chloride SA (K-DUR,KLOR-CON) 20 MEQ tablet Take 20 mEq by mouth daily.     [name] lidocaine  (XYLOCAINE) 2 % solution Use as directed 15 mLs in the mouth or throat every 4 (four) hours as needed (esophagitis). Swish and swallow (Patient not taking: Reported on 06/25/2018) 100 mL 1  . ondansetron (ZOFRAN) 8 MG tablet Take 1 tablet (8 mg total) by mouth 2 (two) times daily as needed for refractory nausea / vomiting. Start on day 3 after chemo. (Patient not taking: Reported on 05/19/2018) 30 tablet 1  . prochlorperazine (COMPAZINE) 10 MG tablet Take 1 tablet (10 mg total) by mouth every 6 (six) hours as needed (Nausea or vomiting). (Patient not taking: Reported on 05/19/2018) 30 tablet 1  . sucralfate (CARAFATE) 1 g tablet Take 1 tablet (1 g total) by mouth 4 (four) times daily -  with meals and at bedtime. 5 min before meals for radiation induced esophagitis (Patient not taking: Reported on 06/25/2018) 120 tablet 2  . umeclidinium-vilanterol (ANORO ELLIPTA) 62.5-25 MCG/INH AEPB Inhale 1 puff into the lungs daily.      No current facility-administered medications for this encounter.     Physical Findings:  height is 5\' 10"  (1.778 m) and weight is 208 lb 12.8 oz (94.7 kg). His oral temperature is 98.5 F (36.9 C). His blood pressure is 147/87 (abnormal) and his pulse is 91. His respiration is 20  and oxygen saturation is 98%.  Pain Assessment Pain Score: 0-No pain/10 In general this is a well appearing Caucasian male in no acute distress.  He's alert and oriented x4 and appropriate throughout the examination. Cardiopulmonary assessment is negative for acute distress and he exhibits normal effort with a regular rate and rhythm, no clicks rubs or murmurs are auscultated.  Chest is clear to auscultation bilaterally.   Lab Findings: Lab Results  Component Value Date   WBC 5.1 05/19/2018   HGB 11.4 (L) 05/19/2018   HCT 35.9 (L) 05/19/2018   MCV 88.6 05/19/2018   PLT 122 (L) 05/19/2018     Radiographic Findings: Nm Pet Image Restag (ps) Skull Base To Thigh  Result Date: 06/23/2018 CLINICAL  DATA:  Subsequent treatment strategy for lung cancer. EXAM: NUCLEAR MEDICINE PET SKULL BASE TO THIGH TECHNIQUE: 9.6 mCi F-18 FDG was injected intravenously. Full-ring PET imaging was performed from the skull base to thigh after the radiotracer. CT data was obtained and used for attenuation correction and anatomic localization. Fasting blood glucose: 129 mg/dl COMPARISON:  Multiple exams, including 03/10/2018 FINDINGS: Mediastinal blood pool activity: SUV max 2.6 NECK: Symmetric activity along the palatine tonsils and anterior tongue without CT correlate, likely physiologic. Incidental CT findings: Bilateral common carotid atherosclerotic calcification. Mucous retention cyst in the left maxillary sinus. Chronic ethmoid sinusitis. CHEST: Notably reduced activity associated with the right upper lobe mass encasing the right upper lobe bronchus and extending into the mediastinum around the bronchus intermedius, maximum SUV currently 3.4 and previously 15.3. There is still severe narrowing of the right upper lobe bronchus as shown on image 25/8. The mass measures approximately 2.8 by 3.3 cm on image 70/4. There are patchy airspace opacities in the right upper lobe, right middle lobe, right lower lobe, and lingula. In particular the lingular airspace opacity is primarily of ground-glass density and has some associated marginal volume loss, but is new compared to the prior exam, with maximum SUV 6.2. Bandlike airspace opacity peripherally in the right lower lobe and a maximum SUV of 5.7. Small to moderate right pleural effusion with accentuated internal metabolic activity, maximum SUV 2.5. There is lower grade activity associated with the remaining airspace opacities noted above. Given the severe narrowing of the right upper lobe and right middle lobe bronchi as well as the moderate narrowing of the right lower lobe tracheobronchial tree centrally, some of these findings may be due to postobstructive pneumonitis. Bilateral  airway thickening is present. The cause of the primarily ground-glass opacity infiltrate in the lingula is not entirely certain, but pneumonia is a distinct possibility. Incidental CT findings: Coronary, aortic arch, and branch vessel atherosclerotic vascular disease. ABDOMEN/PELVIS: No significant abnormal hypermetabolic activity in this region. Incidental CT findings: Hepatic cirrhosis. No splenomegaly. Small bilateral renal cysts. Ventral hernia contains adipose tissue and a loop of small bowel. Infrarenal abdominal aortic aneurysm 3.8 cm in diameter transversely, previously the same. Left posterolateral bladder diverticulum adjacent to the left ureter. Aortoiliac atherosclerotic vascular disease. SKELETON: Mild irregularity of the right eighth rib anteriorly, possibly due to a late phase healing fracture. No findings suspicious for osseous metastatic disease. Incidental CT findings: Bridging spurring of the left sacroiliac joint. IMPRESSION: 1. Marked reduction in activity associated with the dominant right upper lobe central mass, maximum SUV 3.4 and previously 15.3. Please note that this lesion does still in case and markedly narrows the right upper lobe bronchus. 2. There is some patchy ground-glass opacities with small airspace opacity components in the right  upper lobe, right middle lobe, right lower lobe, and lingula. Some of these are hypermetabolic, for example lingular opacity has a maximum SUV of 6.2 and the right lower lobe bandlike opacity 5.7. These are probably inflammatory and merit surveillance. 3. Small to moderate right pleural effusion with accentuated metabolic activity. Malignant effusion not excluded. 4. No findings of metastatic disease to the neck, abdomen/pelvis, or skeleton. 5. Aortic aneurysm NOS (ICD10-I71.9). Infrarenal aortic aneurysm 3.8 cm in diameter. Recommend followup by [place] in 2 years. This recommendation follows ACR consensus guidelines: White Paper of the ACR Incidental  Findings Committee II on Vascular Findings. J Am Coll Radiol 2013; 10:789-794. 6. Other imaging findings of potential clinical significance: Chronic paranasal sinusitis. Aortic Atherosclerosis (ICD10-I70.0). Coronary atherosclerosis. Hepatic cirrhosis. Ventral hernia containing a loop of small bowel. Suspected late phase healing of the right anterior eighth rib. Electronically Signed   By: Van Clines M.D.   On: 06/23/2018 09:27    Impression/Plan: 1. At least Stage III, NSCLC, squamous cell carcinoma of the right hilar region.  The patient appears to be doing very well, his PET scan from earlier this week also shows improvement in his primary tumor without concern for progression.  There was some concern for possible pneumonitis, however the patient does not have clinical features of this.  We will follow this expectantly and see him back as needed, he will also follow-up next week with Dr. Irene Limbo, discuss further plans for possible immunotherapy consolidation.     Carola Rhine, PAC

## 2018-06-30 NOTE — Progress Notes (Signed)
[name]    HEMATOLOGY/ONCOLOGY CLINIC NOTE  Date of Service: 07/01/2018  Patient Care Team: Bernerd Limbo, MD as PCP - General (Family Medicine)  CHIEF COMPLAINTS/PURPOSE OF CONSULTATION:  F/u for mx of lung cancer  HISTORY OF PRESENTING ILLNESS:   Barry Booker is a wonderful 72 y.o. male who has been referred to [place] by Dr Marshell Garfinkel for evaluation and management of presumed newly diagnosed rt sided lung cancer with concern for bronchial obstruction.  Patient has a history of hypertension, dyslipidemia, diabetes, COPD, alcohol abuse and previously had a chest x-ray on 02/20/2018 at [facility] which showed Right upper lobe consolidation/atelectasis may relate to post obstructive pneumonitis/pneumonia. An underlying obstructing lesion should be considered. Consider CT for further assessment.  H subsequently then had a CT of the chest on 02/27/2018 which showed -5.8 x 4.9 cm mass in the right mediastinum and hilum with associated severe narrowing of the right main pulmonary artery as well as right upper lobe and right middle lobe arteries. Obstructed right main stem bronchus and collapse of the right upper lobe and right middle lobe. Small right effusion. The appearance is similar to the patient's chest radiograph from 02/20/2018.  Patient subsequently had a PET CT on 03/10/2018 by Dr. Vaughan Browner which showed Large intensely hypermetabolic RIGHT hilar mass obstructs the RIGHT upper lobe bronchus and surrounds the bronchus intermedius. Post obstructive collapse of the RIGHT upper lobe.  No hypermetabolic mediastinal or supraclavicular lymph nodes. No evidence distant metastatic disease.  Patient had a flexible video fiberoptic bronchoscopy done on 03/21/2018 by Dr. Vaughan Browner which showed which showed some nodularity of the right mainstem bronchial mucosa with near complete occlusion of right main stem. Unable to traverse the occluded part of right main stem. The mucosa was friable with propensity to bleed.  Endobronchial biopsies and brushings were taken from the R mainstem and right upper lobe for pathology.  Preliminary results showed atypical cells however final cytology/pathology is currently pending.  Patient report rt sided chest pain with deep inspiration, anorexia and weight loss of about 30lbs in the last 6 months.  Interval History:   Barry Booker returns to clinic today for management and evaluation of his lung cancer. The patient's last visit with [place] was on 05/19/18. He is accompanied today by his ex-wife. The pt reports that he is doing well overall.   The pt reports that he has been eating much better and has gained 16 pounds in the interim. He notes that his breathing has been stable and denies CP or SOB. The pt notes that he has had stable energy levels as well.   The pt notes that he quit smoking cigarettes in October 2018 and began started smoking again at the beginning of September 2019.   Of note since the patient's last visit, pt has had a PET/CT completed on 06/23/18 with results revealing Marked reduction in activity associated with the dominant right upper lobe central mass, maximum SUV 3.4 and previously 15.3. Please note that this lesion does still in case and markedly narrows the right upper lobe bronchus. 2. There is some patchy ground-glass opacities with small airspace opacity components in the right upper lobe, right middle lobe, right lower lobe, and lingula. Some of these are hypermetabolic, for example lingular opacity has a maximum SUV of 6.2 and the right lower lobe bandlike opacity 5.7. These are probably inflammatory and merit surveillance. 3. Small to moderate right pleural effusion with accentuated metabolic activity. Malignant effusion not excluded. 4. No findings  of metastatic disease to the neck, abdomen/pelvis, or skeleton. 5. Aortic aneurysm NOS. Infrarenal aortic aneurysm 3.8 cm in diameter. Recommend followup by [place] in 2 years. 6. Other imaging findings of  potential clinical significance: Chronic paranasal sinusitis. Aortic Atherosclerosis. Coronary atherosclerosis. Hepatic cirrhosis. Ventral hernia containing a loop of small bowel. Suspected late phase healing of the right anterior eighth rib.  Lab results today (07/01/18) of CBC w/diff, CMP is as follows: all values are WNL except for RBC at 3.93, HGB at 11.5, HCT at 35.0, RDW at 19.0, ANC at 6.6k, Lymphs abs at 700, Albumin at 3.1. 07/01/18 Magnesium is WNL at 1.8  On review of systems, pt reports stable breathing, eating well, weight gain, stable energy levels and denies CP, SOB, difficulty breathing, abdominal pains, and any other symptoms.   MEDICAL HISTORY:  Past Medical History:  Diagnosis Date  . Alcohol abuse   . COPD (chronic obstructive pulmonary disease) ([place])   . Diabetes mellitus without complication ([place])   . Gout   . High cholesterol   . Hypertension   . Malignant neoplasm of right upper lobe of lung ([place]) 03/29/2018    SURGICAL HISTORY: Past Surgical History:  Procedure Laterality Date  . COLONOSCOPY    . FLEXIBLE BRONCHOSCOPY [name] 03/21/2018   Procedure: FLEXIBLE BRONCHOSCOPY;  Surgeon: Marshell Garfinkel, MD;  Location: [place];  Service: Pulmonary;  Laterality: [name];  . NOSE SURGERY  90's  . VIDEO BRONCHOSCOPY WITH ENDOBRONCHIAL ULTRASOUND [name] 03/21/2018   Procedure: VIDEO BRONCHOSCOPY WITH ENDOBRONCHIAL ULTRASOUND;  Surgeon: Marshell Garfinkel, MD;  Location: [place];  Service: Pulmonary;  Laterality: [name];    SOCIAL HISTORY: Social History   Socioeconomic History  . Marital status: Married    Spouse name: Not on file  . Number of children: Not on file  . Years of education: Not on file  . Highest education level: Not on file  Occupational History  . Not on file  Social Needs  . Financial resource strain: Not on file  . Food insecurity:    Worry: Not on file    Inability: Not on file  . Transportation needs:    Medical: Not on file    Non-medical: Not on file    Tobacco Use  . Smoking status: Former Smoker    Packs/day: 0.25    Years: 50.00    Pack years: 12.50    Types: Cigarettes    Last attempt to quit: 07/2017    Years since quitting: 1.0  . Smokeless tobacco: Never Used  . Tobacco comment: quit smoking 07/2017  Substance and Sexual Activity  . Alcohol use: Yes    Alcohol/week: 14.0 - 21.0 standard drinks    Types: 14 - 21 Cans of beer per week  . Drug use: No  . Sexual activity: Not on file  Lifestyle  . Physical activity:    Days per week: Not on file    Minutes per session: Not on file  . Stress: Not on file  Relationships  . Social connections:    Talks on phone: Not on file    Gets together: Not on file    Attends religious service: Not on file    Active member of club or organization: Not on file    Attends meetings of clubs or organizations: Not on file    Relationship status: Not on file  . Intimate partner violence:    Fear of current or ex partner: Not on file    Emotionally abused: Not on  file    Physically abused: Not on file    Forced sexual activity: Not on file  Other Topics Concern  . Not on file  Social History Narrative  . Not on file    FAMILY HISTORY: Family History  Problem Relation Age of Onset  . Arthritis Mother   . Cancer Father   . Cancer Sister   . Heart disease Maternal Uncle   . Colon cancer Neg Hx     ALLERGIES:  is allergic to neomycin-bacitracin zn-polymyx.  MEDICATIONS:  Current Outpatient Medications  Medication Sig Dispense Refill  . aspirin 81 MG tablet Take 81 mg by mouth daily.    [name] atorvastatin (LIPITOR) 40 MG tablet Take 40 mg by mouth daily.     . folic acid (FOLVITE) 295 MCG tablet Take 400 mcg by mouth daily.    . Ipratropium-Albuterol (COMBIVENT IN) Inhale 2 puffs into the lungs every 6 (six) hours as needed (for shortness of breath/wheezing.).    [name] lidocaine (XYLOCAINE) 2 % solution Use as directed 15 mLs in the mouth or throat every 4 (four) hours as needed  (esophagitis). Swish and swallow (Patient not taking: Reported on 06/25/2018) 100 mL 1  . metFORMIN (GLUCOPHAGE) 500 MG tablet Take 500 mg by mouth every morning.    . ondansetron (ZOFRAN) 8 MG tablet Take 1 tablet (8 mg total) by mouth 2 (two) times daily as needed for refractory nausea / vomiting. Start on day 3 after chemo. (Patient not taking: Reported on 05/19/2018) 30 tablet 1  . potassium chloride SA (K-DUR,KLOR-CON) 20 MEQ tablet Take 20 mEq by mouth daily.     . prochlorperazine (COMPAZINE) 10 MG tablet Take 1 tablet (10 mg total) by mouth every 6 (six) hours as needed (Nausea or vomiting). (Patient not taking: Reported on 05/19/2018) 30 tablet 1  . sucralfate (CARAFATE) 1 g tablet Take 1 tablet (1 g total) by mouth 4 (four) times daily -  with meals and at bedtime. 5 min before meals for radiation induced esophagitis (Patient not taking: Reported on 06/25/2018) 120 tablet 2  . umeclidinium-vilanterol (ANORO ELLIPTA) 62.5-25 MCG/INH AEPB Inhale 1 puff into the lungs daily.      No current facility-administered medications for this visit.     REVIEW OF SYSTEMS:    A 10+ POINT REVIEW OF SYSTEMS WAS OBTAINED including neurology, dermatology, psychiatry, cardiac, respiratory, lymph, extremities, GI, GU, Musculoskeletal, constitutional, breasts, reproductive, HEENT.  All pertinent positives are noted in the HPI.  All others are negative.   PHYSICAL EXAMINATION: ECOG PERFORMANCE STATUS: 1 - Symptomatic but completely ambulatory  Vitals:   07/01/18 1331  BP: (!) 149/97  Pulse: 96  Resp: 18  Temp: 98 F (36.7 C)  SpO2: 96%   Filed Weights   07/01/18 1331  Weight: 211 lb 8 oz (95.9 kg)   .Body mass index is 30.35 kg/m.  GENERAL:alert, in no acute distress and comfortable SKIN: no acute rashes, no significant lesions EYES: conjunctiva are pink and non-injected, sclera anicteric OROPHARYNX: MMM, no exudates, no oropharyngeal erythema or ulceration NECK: supple, no JVD LYMPH:  no  palpable lymphadenopathy in the cervical, axillary or inguinal regions LUNGS: decreased air entry right upper and mid zones HEART: regular rate & rhythm ABDOMEN:  normoactive bowel sounds , non tender, not distended. No palpable hepatosplenomegaly.  Extremity: no pedal edema PSYCH: alert & oriented x 3 with fluent speech NEURO: no focal motor/sensory deficits   LABORATORY DATA:  I have reviewed the data as listed .  CBC Latest Ref Rng & Units 07/01/2018 05/19/2018 05/05/2018  WBC 4.0 - 10.3 K/uL 8.5 5.1 3.1(L)  Hemoglobin 13.0 - 17.1 g/dL 11.5(L) 11.4(L) 10.4(L)  Hematocrit 38.4 - 49.9 % 35.0(L) 35.9(L) 32.7(L)  Platelets 140 - 400 K/uL 199 122(L) 86(L)    . CMP Latest Ref Rng & Units 07/01/2018 05/19/2018 05/05/2018  Glucose 70 - 99 mg/dL 88 160(H) 153(H)  BUN 8 - 23 mg/dL _0 Creatinine 0.61 - 1.24 mg/dL 0.86 0.96 0.83  Sodium 135 - 145 mmol/L 141 141 139  Potassium 3.5 - 5.1 mmol/L 4.2 5.0 3.7  Chloride 98 - 111 mmol/L 107 106 107  CO2 22 - 32 mmol/L _1 Calcium 8.9 - 10.3 mg/dL 9.6 9.3 8.8(L)  Total Protein 6.5 - 8.1 g/dL 7.5 6.9 6.4(L)  Total Bilirubin 0.3 - 1.2 mg/dL 0.3 0.4 0.5  Alkaline Phos 38 - 126 U/L 68 77 65  AST 15 - 41 U/L 15 14(L) 11(L)  ALT 0 - 44 U/L _2 03/21/18   03/25/18 Endobronchial bx:     RADIOGRAPHIC STUDIES: I have personally reviewed the radiological images as listed and agreed with the findings in the report. Nm Pet Image Restag (ps) Skull Base To Thigh  Result Date: 06/23/2018 CLINICAL DATA:  Subsequent treatment strategy for lung cancer. EXAM: NUCLEAR MEDICINE PET SKULL BASE TO THIGH TECHNIQUE: 9.6 mCi F-18 FDG was injected intravenously. Full-ring PET imaging was performed from the skull base to thigh after the radiotracer. CT data was obtained and used for attenuation correction and anatomic localization. Fasting blood glucose: 129 mg/dl COMPARISON:  Multiple exams, including 03/10/2018 FINDINGS: Mediastinal blood pool activity:  SUV max 2.6 NECK: Symmetric activity along the palatine tonsils and anterior tongue without CT correlate, likely physiologic. Incidental CT findings: Bilateral common carotid atherosclerotic calcification. Mucous retention cyst in the left maxillary sinus. Chronic ethmoid sinusitis. CHEST: Notably reduced activity associated with the right upper lobe mass encasing the right upper lobe bronchus and extending into the mediastinum around the bronchus intermedius, maximum SUV currently 3.4 and previously 15.3. There is still severe narrowing of the right upper lobe bronchus as shown on image 25/8. The mass measures approximately 2.8 by 3.3 cm on image 70/4. There are patchy airspace opacities in the right upper lobe, right middle lobe, right lower lobe, and lingula. In particular the lingular airspace opacity is primarily of ground-glass density and has some associated marginal volume loss, but is new compared to the prior exam, with maximum SUV 6.2. Bandlike airspace opacity peripherally in the right lower lobe and a maximum SUV of 5.7. Small to moderate right pleural effusion with accentuated internal metabolic activity, maximum SUV 2.5. There is lower grade activity associated with the remaining airspace opacities noted above. Given the severe narrowing of the right upper lobe and right middle lobe bronchi as well as the moderate narrowing of the right lower lobe tracheobronchial tree centrally, some of these findings may be due to postobstructive pneumonitis. Bilateral airway thickening is present. The cause of the primarily ground-glass opacity infiltrate in the lingula is not entirely certain, but pneumonia is a distinct possibility. Incidental CT findings: Coronary, aortic arch, and branch vessel atherosclerotic vascular disease. ABDOMEN/PELVIS: No significant abnormal hypermetabolic activity in this region. Incidental CT findings: Hepatic cirrhosis. No splenomegaly. Small bilateral renal cysts. Ventral hernia  contains adipose tissue and a loop of small bowel. Infrarenal abdominal aortic aneurysm 3.8 cm in diameter transversely, previously the same. Left posterolateral  bladder diverticulum adjacent to the left ureter. Aortoiliac atherosclerotic vascular disease. SKELETON: Mild irregularity of the right eighth rib anteriorly, possibly due to a late phase healing fracture. No findings suspicious for osseous metastatic disease. Incidental CT findings: Bridging spurring of the left sacroiliac joint. IMPRESSION: 1. Marked reduction in activity associated with the dominant right upper lobe central mass, maximum SUV 3.4 and previously 15.3. Please note that this lesion does still in case and markedly narrows the right upper lobe bronchus. 2. There is some patchy ground-glass opacities with small airspace opacity components in the right upper lobe, right middle lobe, right lower lobe, and lingula. Some of these are hypermetabolic, for example lingular opacity has a maximum SUV of 6.2 and the right lower lobe bandlike opacity 5.7. These are probably inflammatory and merit surveillance. 3. Small to moderate right pleural effusion with accentuated metabolic activity. Malignant effusion not excluded. 4. No findings of metastatic disease to the neck, abdomen/pelvis, or skeleton. 5. Aortic aneurysm NOS (ICD10-I71.9). Infrarenal aortic aneurysm 3.8 cm in diameter. Recommend followup by [place] in 2 years. This recommendation follows ACR consensus guidelines: White Paper of the ACR Incidental Findings Committee II on Vascular Findings. J Am Coll Radiol 2013; 10:789-794. 6. Other imaging findings of potential clinical significance: Chronic paranasal sinusitis. Aortic Atherosclerosis (ICD10-I70.0). Coronary atherosclerosis. Hepatic cirrhosis. Ventral hernia containing a loop of small bowel. Suspected late phase healing of the right anterior eighth rib. Electronically Signed   By: Van Clines M.D.   On: 06/23/2018 09:27    ASSESSMENT &  PLAN:   72 y.o.  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 Post obstructive collapse of the Right upper lobe. No hypermetabolic mediastinal or supraclavicular lymph nodes. No evidence of distant mets.   2) RUL airway obstruction with RUL lung collapse. 3) Concern for severe narrowing of the right main pulmonary artery as well as right upper lobe and right middle lobe arteries.   PLAN:  -Advised that the pt follow up with his PCP for management of his BP  -Discussed pt labwork today, 07/01/18; blood counts and chemistries are stable  -Discussed the 06/23/18 PET/CT which revealed  Marked reduction in activity associated with the dominant right upper lobe central mass, maximum SUV 3.4 and previously 15.3. Please note that this lesion does still in case and markedly narrows the right upper lobe bronchus. 2. There is some patchy ground-glass opacities with small airspace opacity components in the right upper lobe, right middle lobe, right lower lobe, and lingula. Some of these are hypermetabolic, for example lingular opacity has a maximum SUV of 6.2 and the right lower lobe bandlike opacity 5.7. These are probably inflammatory and merit surveillance. 3. Small to moderate right pleural effusion with accentuated metabolic activity. Malignant effusion not excluded. 4. No findings of metastatic disease to the neck, abdomen/pelvis, or skeleton. 5. Aortic aneurysm NOS. Infrarenal aortic aneurysm 3.8 cm in diameter. Recommend followup by [place] in 2 years. 6. Other imaging findings of potential clinical significance: Chronic paranasal  sinusitis. Aortic Atherosclerosis. Coronary atherosclerosis. Hepatic cirrhosis. Ventral hernia containing a loop of small bowel. Suspected late phase healing of the right anterior eighth rib. -Discussed the option to pursue maintenance  Durvalumab vs wait and watch, with the former being recommended. Pt prefers to begin Durvalumab  -Counseled the pt towards complete smoking cessation    Plan to start maintenance Durvalumab in 7-10 days with labs RTC with Dr Irene Limbo in 1 week after 1st dose of treatment for toxicity check   All of the patients questions were answered with apparent satisfaction. The patient knows to call the clinic with any problems, questions or concerns.  The total time spent in the appt was 30 minutes and more than 50% was on counseling and direct patient cares.     Sullivan Lone MD MS AAHIVMS [facility] Hematology/Oncology Physician [facility]  (Office):       [phone] (Work cell):  [phone] (Fax):           [phone]  I, Baldwin [place], am acting as a scribe for Dr. Irene Limbo  .I have reviewed the above documentation for accuracy and completeness, and I agree with the above. Brunetta Genera MD

## 2018-07-01 ENCOUNTER — Encounter: Payer: Self-pay | Admitting: Hematology

## 2018-07-01 ENCOUNTER — Inpatient Hospital Stay (HOSPITAL_BASED_OUTPATIENT_CLINIC_OR_DEPARTMENT_OTHER): Payer: Medicare HMO | Admitting: Hematology

## 2018-07-01 ENCOUNTER — Inpatient Hospital Stay: Payer: Medicare HMO | Attending: Hematology

## 2018-07-01 VITALS — BP 149/97 | HR 96 | Temp 98.0°F | Resp 18 | Ht 70.0 in | Wt 211.5 lb

## 2018-07-01 DIAGNOSIS — J90 Pleural effusion, not elsewhere classified: Secondary | ICD-10-CM | POA: Diagnosis not present

## 2018-07-01 DIAGNOSIS — Z5112 Encounter for antineoplastic immunotherapy: Secondary | ICD-10-CM | POA: Insufficient documentation

## 2018-07-01 DIAGNOSIS — C3401 Malignant neoplasm of right main bronchus: Secondary | ICD-10-CM

## 2018-07-01 DIAGNOSIS — E119 Type 2 diabetes mellitus without complications: Secondary | ICD-10-CM

## 2018-07-01 DIAGNOSIS — I10 Essential (primary) hypertension: Secondary | ICD-10-CM

## 2018-07-01 DIAGNOSIS — C3411 Malignant neoplasm of upper lobe, right bronchus or lung: Secondary | ICD-10-CM

## 2018-07-01 DIAGNOSIS — Z79899 Other long term (current) drug therapy: Secondary | ICD-10-CM | POA: Diagnosis not present

## 2018-07-01 LAB — CMP (CANCER CENTER ONLY)
ALBUMIN: 3.1 g/dL — AB (ref 3.5–5.0)
ALT: 10 U/L (ref 0–44)
ANION GAP: 8 (ref 5–15)
AST: 15 U/L (ref 15–41)
Alkaline Phosphatase: 68 U/L (ref 38–126)
BUN: 13 mg/dL (ref 8–23)
CHLORIDE: 107 mmol/L (ref 98–111)
CO2: 26 mmol/L (ref 22–32)
Calcium: 9.6 mg/dL (ref 8.9–10.3)
Creatinine: 0.86 mg/dL (ref 0.61–1.24)
GFR, Est AFR Am: >60 mL/min (ref >60)
GFR, Estimated: >60 mL/min (ref >60)
GLUCOSE: 88 mg/dL (ref 70–99)
POTASSIUM: 4.2 mmol/L (ref 3.5–5.1)
SODIUM: 141 mmol/L (ref 135–145)
TOTAL PROTEIN: 7.5 g/dL (ref 6.5–8.1)
Total Bilirubin: 0.3 mg/dL (ref 0.3–1.2)

## 2018-07-01 LAB — CBC WITH DIFFERENTIAL/PLATELET
BASOS ABS: 0 10*3/uL (ref 0.0–0.1)
BASOS PCT: 1 %
EOS ABS: 0.2 10*3/uL (ref 0.0–0.5)
Eosinophils Relative: 3 %
HCT: 35 % — ABNORMAL LOW (ref 38.4–49.9)
Hemoglobin: 11.5 g/dL — ABNORMAL LOW (ref 13.0–17.1)
Lymphocytes Relative: 8 %
Lymphs Abs: 0.7 10*3/uL — ABNORMAL LOW (ref 0.9–3.3)
MCH: 29.2 pg (ref 27.2–33.4)
MCHC: 32.8 g/dL (ref 32.0–36.0)
MCV: 89 fL (ref 79.3–98.0)
MONOS PCT: 11 %
Monocytes Absolute: 0.9 10*3/uL (ref 0.1–0.9)
NEUTROS PCT: 77 %
Neutro Abs: 6.6 10*3/uL — ABNORMAL HIGH (ref 1.5–6.5)
Platelets: 199 10*3/uL (ref 140–400)
RBC: 3.93 MIL/uL — ABNORMAL LOW (ref 4.20–5.82)
RDW: 19 % — AB (ref 11.0–14.6)
WBC: 8.5 10*3/uL (ref 4.0–10.3)

## 2018-07-01 LAB — MAGNESIUM: MAGNESIUM: 1.8 mg/dL (ref 1.7–2.4)

## 2018-07-02 ENCOUNTER — Telehealth: Payer: Self-pay

## 2018-07-02 NOTE — Telephone Encounter (Signed)
Spoke with patient wife concerning upcoming appointments that was scheduled per 10/1 los

## 2018-07-11 ENCOUNTER — Inpatient Hospital Stay: Payer: Medicare HMO

## 2018-07-11 ENCOUNTER — Telehealth: Payer: Self-pay | Admitting: *Deleted

## 2018-07-11 ENCOUNTER — Other Ambulatory Visit: Payer: Self-pay | Admitting: *Deleted

## 2018-07-11 ENCOUNTER — Other Ambulatory Visit: Payer: Self-pay | Admitting: Hematology

## 2018-07-11 VITALS — BP 129/98 | HR 88 | Temp 98.0°F | Resp 18 | Wt 213.0 lb

## 2018-07-11 DIAGNOSIS — C3491 Malignant neoplasm of unspecified part of right bronchus or lung: Secondary | ICD-10-CM

## 2018-07-11 DIAGNOSIS — Z5112 Encounter for antineoplastic immunotherapy: Secondary | ICD-10-CM | POA: Diagnosis not present

## 2018-07-11 DIAGNOSIS — Z7189 Other specified counseling: Secondary | ICD-10-CM

## 2018-07-11 LAB — TSH: TSH: 2.745 u[IU]/mL (ref 0.320–4.118)

## 2018-07-11 MED ORDER — SODIUM CHLORIDE 0.9 % IV SOLN
Freq: Once | INTRAVENOUS | Status: AC
  Administered 2018-07-11: 10:00:00 via INTRAVENOUS
  Filled 2018-07-11: qty 250

## 2018-07-11 MED ORDER — SODIUM CHLORIDE 0.9 % IV SOLN
10.5000 mg/kg | Freq: Once | INTRAVENOUS | Status: AC
  Administered 2018-07-11: 1000 mg via INTRAVENOUS
  Filled 2018-07-11: qty 20

## 2018-07-11 NOTE — Telephone Encounter (Signed)
Per Dr. Irene Limbo: ok to treat patient this morning with labs from 10/1.

## 2018-07-11 NOTE — Patient Instructions (Signed)
[facility] Discharge Instructions for Patients Receiving Chemotherapy  Today you received the following chemotherapy agents Imfinzi  To help prevent nausea and vomiting after your treatment, we encourage you to take your nausea medication as directed  If you develop nausea and vomiting that is not controlled by your nausea medication, call the clinic.   BELOW ARE SYMPTOMS THAT SHOULD BE REPORTED IMMEDIATELY:  *FEVER GREATER THAN 100.5 F  *CHILLS WITH OR WITHOUT FEVER  NAUSEA AND VOMITING THAT IS NOT CONTROLLED WITH YOUR NAUSEA MEDICATION  *UNUSUAL SHORTNESS OF BREATH  *UNUSUAL BRUISING OR BLEEDING  TENDERNESS IN MOUTH AND THROAT WITH OR WITHOUT PRESENCE OF ULCERS  *URINARY PROBLEMS  *BOWEL PROBLEMS  UNUSUAL RASH Items with * indicate a potential emergency and should be followed up as soon as possible.  Feel free to call the clinic should you have any questions or concerns. The clinic phone number is [phone].  Please show the [place] at check-in to the Emergency Department and triage nurse.   Durvalumab injection What is this medicine? DURVALUMAB (dur VAL ue mab) is a monoclonal antibody. It is used to treat urothelial cancer. This medicine may be used for other purposes; ask your health care provider or pharmacist if you have questions. COMMON BRAND NAME(S): IMFINZI What should I tell my health care provider before I take this medicine? They need to know if you have any of these conditions: -diabetes -immune system problems -infection -inflammatory bowel disease -kidney disease -liver disease -lung or breathing disease -lupus -organ transplant -stomach or intestine problems -thyroid disease -an unusual or allergic reaction to durvalumab, other medicines, foods, dyes, or preservatives -pregnant or trying to get pregnant -breast-feeding How should I use this medicine? This medicine is for infusion into a vein. It is given by a  health care professional in a hospital or clinic setting. A special MedGuide will be given to you before each treatment. Be sure to read this information carefully each time. Talk to your pediatrician regarding the use of this medicine in children. Special care may be needed. Overdosage: If you think you have taken too much of this medicine contact a poison control center or emergency room at once. NOTE: This medicine is only for you. Do not share this medicine with others. What if I miss a dose? It is important not to miss your dose. Call your doctor or health care professional if you are unable to keep an appointment. What may interact with this medicine? Interactions have not been studied. This list may not describe all possible interactions. Give your health care provider a list of all the medicines, herbs, non-prescription drugs, or dietary supplements you use. Also tell them if you smoke, drink alcohol, or use illegal drugs. Some items may interact with your medicine. What should I watch for while using this medicine? This drug may make you feel generally unwell. Continue your course of treatment even though you feel ill unless your doctor tells you to stop. You may need blood work done while you are taking this medicine. Do not become pregnant while taking this medicine or for 3 months after stopping it. Women should inform their doctor if they wish to become pregnant or think they might be pregnant. There is a potential for serious side effects to an unborn child. Talk to your health care professional or pharmacist for more information. Do not breast-feed an infant while taking this medicine or for 3 months after stopping it. What side effects may  I notice from receiving this medicine? Side effects that you should report to your doctor or health care professional as soon as possible: -allergic reactions like skin rash, itching or hives, swelling of the face, lips, or tongue -black, tarry  stools -bloody or watery diarrhea -breathing problems -change in emotions or moods -change in sex drive -changes in vision -chest pain or chest tightness -chills -confusion -cough -facial flushing -fever -headache -signs and symptoms of high blood sugar such as dizziness; dry mouth; dry skin; fruity breath; nausea; stomach pain; increased hunger or thirst; increased urination -signs and symptoms of liver injury like dark yellow or Mulvaney urine; general ill feeling or flu-like symptoms; light-colored stools; loss of appetite; nausea; right upper belly pain; unusually weak or tired; yellowing of the eyes or skin -stomach pain -trouble passing urine or change in the amount of urine -weight gain or weight loss Side effects that usually do not require medical attention (report these to your doctor or health care professional if they continue or are bothersome): -bone pain -constipation -loss of appetite -muscle pain -nausea -swelling of the ankles, feet, hands -tiredness This list may not describe all possible side effects. Call your doctor for medical advice about side effects. You may report side effects to FDA at 1-800-FDA-1088. Where should I keep my medicine? This drug is given in a hospital or clinic and will not be stored at home. NOTE: This sheet is a summary. It may not cover all possible information. If you have questions about this medicine, talk to your doctor, pharmacist, or health care provider.  2018 Elsevier/Gold Standard (2016-04-20 15:50:36)

## 2018-07-17 ENCOUNTER — Ambulatory Visit: Payer: Medicare HMO | Admitting: Hematology

## 2018-07-17 ENCOUNTER — Other Ambulatory Visit: Payer: Medicare HMO

## 2018-07-17 NOTE — Progress Notes (Signed)
[name]    HEMATOLOGY/ONCOLOGY CLINIC NOTE  Date of Service: 07/18/2018  Patient Care Team: Bernerd Limbo, MD as PCP - General (Family Medicine)  CHIEF COMPLAINTS/PURPOSE OF CONSULTATION:  F/u for mx of lung cancer  HISTORY OF PRESENTING ILLNESS:   Barry Booker is a wonderful 72 y.o. male who has been referred to [place] by Dr Marshell Garfinkel for evaluation and management of presumed newly diagnosed rt sided lung cancer with concern for bronchial obstruction.  Patient has a history of hypertension, dyslipidemia, diabetes, COPD, alcohol abuse and previously had a chest x-ray on 02/20/2018 at [facility] which showed Right upper lobe consolidation/atelectasis may relate to post obstructive pneumonitis/pneumonia. An underlying obstructing lesion should be considered. Consider CT for further assessment.  H subsequently then had a CT of the chest on 02/27/2018 which showed -5.8 x 4.9 cm mass in the right mediastinum and hilum with associated severe narrowing of the right main pulmonary artery as well as right upper lobe and right middle lobe arteries. Obstructed right main stem bronchus and collapse of the right upper lobe and right middle lobe. Small right effusion. The appearance is similar to the patient's chest radiograph from 02/20/2018.  Patient subsequently had a PET CT on 03/10/2018 by Dr. Vaughan Browner which showed Large intensely hypermetabolic RIGHT hilar mass obstructs the RIGHT upper lobe bronchus and surrounds the bronchus intermedius. Post obstructive collapse of the RIGHT upper lobe.  No hypermetabolic mediastinal or supraclavicular lymph nodes. No evidence distant metastatic disease.  Patient had a flexible video fiberoptic bronchoscopy done on 03/21/2018 by Dr. Vaughan Browner which showed which showed some nodularity of the right mainstem bronchial mucosa with near complete occlusion of right main stem. Unable to traverse the occluded part of right main stem. The mucosa was friable with propensity to bleed.  Endobronchial biopsies and brushings were taken from the R mainstem and right upper lobe for pathology.  Preliminary results showed atypical cells however final cytology/pathology is currently pending.  Patient report rt sided chest pain with deep inspiration, anorexia and weight loss of about 30lbs in the last 6 months.  Interval History:   Barry Booker returns to clinic today for management and evaluation of his lung cancer. The patient's last visit with [place] was on 07/01/18. He is accompanied today by his partner. The pt reports that he is doing well overall.   The pt reports that he has been eating well and has been gaining weight. The pt notes that he has been craving sweet foods and has no complaints at this time. The pt notes that he has continued to smoke, but is smoking less, three cigarettes yesterday.   The pt notes that he tolerated Durvalumab very well and has not developed any skin rashes, diarrhea, or other symptoms.   Lab results today (07/18/18) of CBC w/diff, CMP is as follows: all values are WNL except for RBC at 4.10, HGB at 11.8, HCT at 37.2, Glucose at 117, Albumin at 3.3. On 10/11 TSH is 2.745  On review of systems, pt reports stable energy levels, eating well, weight gain, and denies skin rashes, diarrhea, concerns for infections, abdominal pains, leg swelling, and any other symptoms.   MEDICAL HISTORY:  Past Medical History:  Diagnosis Date  . Alcohol abuse   . COPD (chronic obstructive pulmonary disease) ([place])   . Diabetes mellitus without complication ([place])   . Gout   . High cholesterol   . Hypertension   . Malignant neoplasm of right upper lobe of lung ([place]) 03/29/2018  SURGICAL HISTORY: Past Surgical History:  Procedure Laterality Date  . COLONOSCOPY    . FLEXIBLE BRONCHOSCOPY [name] 03/21/2018   Procedure: FLEXIBLE BRONCHOSCOPY;  Surgeon: Marshell Garfinkel, MD;  Location: [facility];  Service: Pulmonary;  Laterality: [name];  . NOSE SURGERY  90's  . VIDEO  BRONCHOSCOPY WITH ENDOBRONCHIAL ULTRASOUND [name] 03/21/2018   Procedure: VIDEO BRONCHOSCOPY WITH ENDOBRONCHIAL ULTRASOUND;  Surgeon: Marshell Garfinkel, MD;  Location: [facility];  Service: Pulmonary;  Laterality: [name];    SOCIAL HISTORY: Social History   Socioeconomic History  . Marital status: Married    Spouse name: Not on file  . Number of children: Not on file  . Years of education: Not on file  . Highest education level: Not on file  Occupational History  . Not on file  Social Needs  . Financial resource strain: Not on file  . Food insecurity:    Worry: Not on file    Inability: Not on file  . Transportation needs:    Medical: Not on file    Non-medical: Not on file  Tobacco Use  . Smoking status: Former Smoker    Packs/day: 0.25    Years: 50.00    Pack years: 12.50    Types: Cigarettes    Last attempt to quit: 07/2017    Years since quitting: 1.0  . Smokeless tobacco: Never Used  . Tobacco comment: quit smoking 07/2017  Substance and Sexual Activity  . Alcohol use: Yes    Alcohol/week: 14.0 - 21.0 standard drinks    Types: 14 - 21 Cans of beer per week  . Drug use: No  . Sexual activity: Not on file  Lifestyle  . Physical activity:    Days per week: Not on file    Minutes per session: Not on file  . Stress: Not on file  Relationships  . Social connections:    Talks on phone: Not on file    Gets together: Not on file    Attends religious service: Not on file    Active member of club or organization: Not on file    Attends meetings of clubs or organizations: Not on file    Relationship status: Not on file  . Intimate partner violence:    Fear of current or ex partner: Not on file    Emotionally abused: Not on file    Physically abused: Not on file    Forced sexual activity: Not on file  Other Topics Concern  . Not on file  Social History Narrative  . Not on file    FAMILY HISTORY: Family History  Problem Relation Age of Onset  . Arthritis Mother   . Cancer  Father   . Cancer Sister   . Heart disease Maternal Uncle   . Colon cancer Neg Hx     ALLERGIES:  is allergic to neomycin-bacitracin zn-polymyx.  MEDICATIONS:  Current Outpatient Medications  Medication Sig Dispense Refill  . aspirin 81 MG tablet Take 81 mg by mouth daily.    [name] atorvastatin (LIPITOR) 40 MG tablet Take 40 mg by mouth daily.     . folic acid (FOLVITE) 409 MCG tablet Take 400 mcg by mouth daily.    . Ipratropium-Albuterol (COMBIVENT IN) Inhale 2 puffs into the lungs every 6 (six) hours as needed (for shortness of breath/wheezing.).    [name] lidocaine (XYLOCAINE) 2 % solution Use as directed 15 mLs in the mouth or throat every 4 (four) hours as needed (esophagitis). Swish and swallow 100 mL 1  .  metFORMIN (GLUCOPHAGE) 500 MG tablet Take 500 mg by mouth every morning.    . potassium chloride SA (K-DUR,KLOR-CON) 20 MEQ tablet Take 20 mEq by mouth daily.     . sucralfate (CARAFATE) 1 g tablet Take 1 tablet (1 g total) by mouth 4 (four) times daily -  with meals and at bedtime. 5 min before meals for radiation induced esophagitis 120 tablet 2  . umeclidinium-vilanterol (ANORO ELLIPTA) 62.5-25 MCG/INH AEPB Inhale 1 puff into the lungs daily.      No current facility-administered medications for this visit.     REVIEW OF SYSTEMS:    A 10+ POINT REVIEW OF SYSTEMS WAS OBTAINED including neurology, dermatology, psychiatry, cardiac, respiratory, lymph, extremities, GI, GU, Musculoskeletal, constitutional, breasts, reproductive, HEENT.  All pertinent positives are noted in the HPI.  All others are negative.   PHYSICAL EXAMINATION: ECOG PERFORMANCE STATUS: 1 - Symptomatic but completely ambulatory  Vitals:   07/18/18 0840  BP: (!) 140/91  Pulse: 91  Resp: 18  Temp: 97.7 F (36.5 C)  SpO2: 98%   Filed Weights   07/18/18 0840  Weight: 219 lb (99.3 kg)   .Body mass index is 31.42 kg/m.  GENERAL:alert, in no acute distress and comfortable SKIN: no acute rashes, no significant  lesions EYES: conjunctiva are pink and non-injected, sclera anicteric OROPHARYNX: MMM, no exudates, no oropharyngeal erythema or ulceration NECK: supple, no JVD LYMPH:  no palpable lymphadenopathy in the cervical, axillary or inguinal regions LUNGS: decreased air entry right upper and mid zones HEART: regular rate & rhythm ABDOMEN:  normoactive bowel sounds , non tender, not distended. No palpable hepatosplenomegaly.  Extremity: no pedal edema PSYCH: alert & oriented x 3 with fluent speech NEURO: no focal motor/sensory deficits   LABORATORY DATA:  I have reviewed the data as listed . CBC Latest Ref Rng & Units 07/18/2018 07/01/2018 05/19/2018  WBC 4.0 - 10.5 K/uL 7.2 8.5 5.1  Hemoglobin 13.0 - 17.0 g/dL 11.8(L) 11.5(L) 11.4(L)  Hematocrit 39.0 - 52.0 % 37.2(L) 35.0(L) 35.9(L)  Platelets 150 - 400 K/uL 157 199 122(L)    . CMP Latest Ref Rng & Units 07/18/2018 07/01/2018 05/19/2018  Glucose 70 - 99 mg/dL 117(H) 88 160(H)  BUN 8 - 23 mg/dL _0 Creatinine 0.61 - 1.24 mg/dL 0.79 0.86 0.96  Sodium 135 - 145 mmol/L 141 141 141  Potassium 3.5 - 5.1 mmol/L 4.2 4.2 5.0  Chloride 98 - 111 mmol/L 105 107 106  CO2 22 - 32 mmol/L _1 Calcium 8.9 - 10.3 mg/dL 9.7 9.6 9.3  Total Protein 6.5 - 8.1 g/dL 7.6 7.5 6.9  Total Bilirubin 0.3 - 1.2 mg/dL 0.3 0.3 0.4  Alkaline Phos 38 - 126 U/L 68 68 77  AST 15 - 41 U/L 16 15 14(L)  ALT 0 - 44 U/L _2 03/21/18   03/25/18 Endobronchial bx:     RADIOGRAPHIC STUDIES: I have personally reviewed the radiological images as listed and agreed with the findings in the report. Nm Pet Image Restag (ps) Skull Base To Thigh  Result Date: 06/23/2018 CLINICAL DATA:  Subsequent treatment strategy for lung cancer. EXAM: NUCLEAR MEDICINE PET SKULL BASE TO THIGH TECHNIQUE: 9.6 mCi F-18 FDG was injected intravenously. Full-ring PET imaging was performed from the skull base to thigh after the radiotracer. CT data was obtained and used for attenuation  correction and anatomic localization. Fasting blood glucose: 129 mg/dl COMPARISON:  Multiple exams, including 03/10/2018 FINDINGS: Mediastinal blood  pool activity: SUV max 2.6 NECK: Symmetric activity along the palatine tonsils and anterior tongue without CT correlate, likely physiologic. Incidental CT findings: Bilateral common carotid atherosclerotic calcification. Mucous retention cyst in the left maxillary sinus. Chronic ethmoid sinusitis. CHEST: Notably reduced activity associated with the right upper lobe mass encasing the right upper lobe bronchus and extending into the mediastinum around the bronchus intermedius, maximum SUV currently 3.4 and previously 15.3. There is still severe narrowing of the right upper lobe bronchus as shown on image 25/8. The mass measures approximately 2.8 by 3.3 cm on image 70/4. There are patchy airspace opacities in the right upper lobe, right middle lobe, right lower lobe, and lingula. In particular the lingular airspace opacity is primarily of ground-glass density and has some associated marginal volume loss, but is new compared to the prior exam, with maximum SUV 6.2. Bandlike airspace opacity peripherally in the right lower lobe and a maximum SUV of 5.7. Small to moderate right pleural effusion with accentuated internal metabolic activity, maximum SUV 2.5. There is lower grade activity associated with the remaining airspace opacities noted above. Given the severe narrowing of the right upper lobe and right middle lobe bronchi as well as the moderate narrowing of the right lower lobe tracheobronchial tree centrally, some of these findings may be due to postobstructive pneumonitis. Bilateral airway thickening is present. The cause of the primarily ground-glass opacity infiltrate in the lingula is not entirely certain, but pneumonia is a distinct possibility. Incidental CT findings: Coronary, aortic arch, and branch vessel atherosclerotic vascular disease. ABDOMEN/PELVIS: No  significant abnormal hypermetabolic activity in this region. Incidental CT findings: Hepatic cirrhosis. No splenomegaly. Small bilateral renal cysts. Ventral hernia contains adipose tissue and a loop of small bowel. Infrarenal abdominal aortic aneurysm 3.8 cm in diameter transversely, previously the same. Left posterolateral bladder diverticulum adjacent to the left ureter. Aortoiliac atherosclerotic vascular disease. SKELETON: Mild irregularity of the right eighth rib anteriorly, possibly due to a late phase healing fracture. No findings suspicious for osseous metastatic disease. Incidental CT findings: Bridging spurring of the left sacroiliac joint. IMPRESSION: 1. Marked reduction in activity associated with the dominant right upper lobe central mass, maximum SUV 3.4 and previously 15.3. Please note that this lesion does still in case and markedly narrows the right upper lobe bronchus. 2. There is some patchy ground-glass opacities with small airspace opacity components in the right upper lobe, right middle lobe, right lower lobe, and lingula. Some of these are hypermetabolic, for example lingular opacity has a maximum SUV of 6.2 and the right lower lobe bandlike opacity 5.7. These are probably inflammatory and merit surveillance. 3. Small to moderate right pleural effusion with accentuated metabolic activity. Malignant effusion not excluded. 4. No findings of metastatic disease to the neck, abdomen/pelvis, or skeleton. 5. Aortic aneurysm NOS (ICD10-I71.9). Infrarenal aortic aneurysm 3.8 cm in diameter. Recommend followup by [place] in 2 years. This recommendation follows ACR consensus guidelines: White Paper of the ACR Incidental Findings Committee II on Vascular Findings. J Am Coll Radiol 2013; 10:789-794. 6. Other imaging findings of potential clinical significance: Chronic paranasal sinusitis. Aortic Atherosclerosis (ICD10-I70.0). Coronary atherosclerosis. Hepatic cirrhosis. Ventral hernia containing a loop of  small bowel. Suspected late phase healing of the right anterior eighth rib. Electronically Signed   By: Van Clines M.D.   On: 06/23/2018 09:27    ASSESSMENT & PLAN:   72 y.o.  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 Post obstructive collapse of the Right upper lobe. No hypermetabolic mediastinal or supraclavicular lymph nodes. No evidence of distant mets.  06/23/18 PET/CT revealed  Marked reduction in activity associated with the dominant right upper lobe central mass, maximum SUV 3.4 and previously 15.3. Please note that this lesion does still in case and markedly narrows the right upper lobe bronchus. 2. There is some patchy ground-glass opacities with small airspace opacity components in the right upper lobe, right middle lobe, right lower lobe, and lingula. Some of these are hypermetabolic, for example lingular opacity has a maximum SUV of 6.2 and the right lower lobe bandlike opacity 5.7. These are probably inflammatory and merit surveillance. 3. Small to moderate right pleural effusion with accentuated metabolic activity. Malignant effusion not excluded. 4. No findings of metastatic disease to the neck, abdomen/pelvis, or skeleton. 5. Aortic aneurysm NOS. Infrarenal aortic aneurysm 3.8 cm in diameter. Recommend followup by [place] in 2 years. 6. Other imaging findings of potential clinical significance: Chronic paranasal sinusitis. Aortic Atherosclerosis. Coronary atherosclerosis. Hepatic cirrhosis. Ventral hernia containing a loop of small bowel. Suspected late phase healing of the right anterior eighth rib.   2)  RUL airway obstruction with RUL lung collapse. 3) Concern for severe narrowing of the right main pulmonary artery as well as right upper lobe and right middle lobe arteries.   PLAN:  -Advised that the pt follow up with his PCP for management of his BP  -Discussed pt labwork today, 07/18/18; blood counts improved with HGB up to 11.8, blood chemistries are stable  -TSH WNL on 07/01/2018 -- pending from today --will monitor on PD1 inhibitor therapy -The pt has no prohibitive toxicities from continuing maintenance Durvalumab every 2 weeks at this time.   -Discussed the plan to complete maintenance Durvalumab for up to one year -Counseled the pt towards complete smoking cessation -Will see the pt back in 4 weeks    Please schedule Immunotherapy(Durvalumab) q2weeks with labs as ordered ()plz schedule next 4 treatments) Does not need labs with next treatment on 07/25/2018. RTC with Dr Irene Limbo with treatment on 08/22/2018.    All of the patients questions were answered with apparent satisfaction. The patient knows to call the clinic with any problems, questions or concerns.  The total time spent in the appt was 25 minutes and more than 50% was on counseling and direct patient cares.     Sullivan Lone MD MS AAHIVMS [facility] Hematology/Oncology Physician [facility]  (Office):       [phone] (Work cell):  [phone] (Fax):           [phone]  I, Baldwin [place], am acting as a scribe for Dr. Irene Limbo  .I have reviewed the above documentation for accuracy and completeness, and I agree with the above. Brunetta Genera MD

## 2018-07-18 ENCOUNTER — Inpatient Hospital Stay (HOSPITAL_BASED_OUTPATIENT_CLINIC_OR_DEPARTMENT_OTHER): Payer: Medicare HMO | Admitting: Hematology

## 2018-07-18 ENCOUNTER — Encounter: Payer: Self-pay | Admitting: Hematology

## 2018-07-18 ENCOUNTER — Telehealth: Payer: Self-pay | Admitting: Hematology

## 2018-07-18 ENCOUNTER — Inpatient Hospital Stay: Payer: Medicare HMO

## 2018-07-18 VITALS — BP 140/91 | HR 91 | Temp 97.7°F | Resp 18 | Ht 70.0 in | Wt 219.0 lb

## 2018-07-18 DIAGNOSIS — Z5112 Encounter for antineoplastic immunotherapy: Secondary | ICD-10-CM | POA: Diagnosis not present

## 2018-07-18 DIAGNOSIS — C3491 Malignant neoplasm of unspecified part of right bronchus or lung: Secondary | ICD-10-CM

## 2018-07-18 DIAGNOSIS — I10 Essential (primary) hypertension: Secondary | ICD-10-CM | POA: Diagnosis not present

## 2018-07-18 DIAGNOSIS — J90 Pleural effusion, not elsewhere classified: Secondary | ICD-10-CM

## 2018-07-18 DIAGNOSIS — C3401 Malignant neoplasm of right main bronchus: Secondary | ICD-10-CM

## 2018-07-18 DIAGNOSIS — Z79899 Other long term (current) drug therapy: Secondary | ICD-10-CM

## 2018-07-18 DIAGNOSIS — E119 Type 2 diabetes mellitus without complications: Secondary | ICD-10-CM

## 2018-07-18 DIAGNOSIS — C3411 Malignant neoplasm of upper lobe, right bronchus or lung: Secondary | ICD-10-CM

## 2018-07-18 DIAGNOSIS — Z5111 Encounter for antineoplastic chemotherapy: Secondary | ICD-10-CM

## 2018-07-18 LAB — CMP (CANCER CENTER ONLY)
ALBUMIN: 3.3 g/dL — AB (ref 3.5–5.0)
ALT: 10 U/L (ref 0–44)
ANION GAP: 10 (ref 5–15)
AST: 16 U/L (ref 15–41)
Alkaline Phosphatase: 68 U/L (ref 38–126)
BUN: 12 mg/dL (ref 8–23)
CHLORIDE: 105 mmol/L (ref 98–111)
CO2: 26 mmol/L (ref 22–32)
Calcium: 9.7 mg/dL (ref 8.9–10.3)
Creatinine: 0.79 mg/dL (ref 0.61–1.24)
GFR, Est AFR Am: >60 mL/min (ref >60)
GFR, Estimated: >60 mL/min (ref >60)
GLUCOSE: 117 mg/dL — AB (ref 70–99)
POTASSIUM: 4.2 mmol/L (ref 3.5–5.1)
Sodium: 141 mmol/L (ref 135–145)
TOTAL PROTEIN: 7.6 g/dL (ref 6.5–8.1)
Total Bilirubin: 0.3 mg/dL (ref 0.3–1.2)

## 2018-07-18 LAB — CBC WITH DIFFERENTIAL/PLATELET
Abs Immature Granulocytes: 0.04 10*3/uL (ref 0.00–0.07)
BASOS ABS: 0 10*3/uL (ref 0.0–0.1)
Basophils Relative: 0 %
EOS ABS: 0.2 10*3/uL (ref 0.0–0.5)
EOS PCT: 3 %
HCT: 37.2 % — ABNORMAL LOW (ref 39.0–52.0)
Hemoglobin: 11.8 g/dL — ABNORMAL LOW (ref 13.0–17.0)
Immature Granulocytes: 1 %
Lymphocytes Relative: 9 %
Lymphs Abs: 0.7 10*3/uL (ref 0.7–4.0)
MCH: 28.8 pg (ref 26.0–34.0)
MCHC: 31.7 g/dL (ref 30.0–36.0)
MCV: 90.7 fL (ref 80.0–100.0)
Monocytes Absolute: 0.9 10*3/uL (ref 0.1–1.0)
Monocytes Relative: 13 %
NEUTROS PCT: 74 %
NRBC: 0 % (ref 0.0–0.2)
Neutro Abs: 5.3 10*3/uL (ref 1.7–7.7)
PLATELETS: 157 10*3/uL (ref 150–400)
RBC: 4.1 MIL/uL — AB (ref 4.22–5.81)
RDW: 15.2 % (ref 11.5–15.5)
WBC: 7.2 10*3/uL (ref 4.0–10.5)

## 2018-07-18 LAB — TSH: TSH: 4.445 u[IU]/mL — ABNORMAL HIGH (ref 0.320–4.118)

## 2018-07-18 NOTE — Telephone Encounter (Signed)
Scheduled appt per 10/18 los - gave patient aVS and calender per los.

## 2018-07-25 ENCOUNTER — Inpatient Hospital Stay: Payer: Medicare HMO

## 2018-07-25 VITALS — BP 147/93 | HR 93 | Temp 97.6°F | Resp 20

## 2018-07-25 DIAGNOSIS — C3491 Malignant neoplasm of unspecified part of right bronchus or lung: Secondary | ICD-10-CM

## 2018-07-25 DIAGNOSIS — Z5112 Encounter for antineoplastic immunotherapy: Secondary | ICD-10-CM | POA: Diagnosis not present

## 2018-07-25 DIAGNOSIS — Z7189 Other specified counseling: Secondary | ICD-10-CM

## 2018-07-25 MED ORDER — SODIUM CHLORIDE 0.9 % IV SOLN
Freq: Once | INTRAVENOUS | Status: AC
  Administered 2018-07-25: 09:00:00 via INTRAVENOUS
  Filled 2018-07-25: qty 250

## 2018-07-25 MED ORDER — SODIUM CHLORIDE 0.9 % IV SOLN
10.5000 mg/kg | Freq: Once | INTRAVENOUS | Status: AC
  Administered 2018-07-25: 1000 mg via INTRAVENOUS
  Filled 2018-07-25: qty 20

## 2018-07-25 NOTE — Progress Notes (Signed)
Dr. Irene Limbo ok to use labs from 07/18/18 and proceed with tx today.

## 2018-07-25 NOTE — Patient Instructions (Signed)
[facility] Discharge Instructions for Patients Receiving Chemotherapy  Today you received the following chemotherapy agents: Imfinzi.  To help prevent nausea and vomiting after your treatment, we encourage you to take your nausea medication as directed.   If you develop nausea and vomiting that is not controlled by your nausea medication, call the clinic.   BELOW ARE SYMPTOMS THAT SHOULD BE REPORTED IMMEDIATELY:  *FEVER GREATER THAN 100.5 F  *CHILLS WITH OR WITHOUT FEVER  NAUSEA AND VOMITING THAT IS NOT CONTROLLED WITH YOUR NAUSEA MEDICATION  *UNUSUAL SHORTNESS OF BREATH  *UNUSUAL BRUISING OR BLEEDING  TENDERNESS IN MOUTH AND THROAT WITH OR WITHOUT PRESENCE OF ULCERS  *URINARY PROBLEMS  *BOWEL PROBLEMS  UNUSUAL RASH Items with * indicate a potential emergency and should be followed up as soon as possible.  Feel free to call the clinic should you have any questions or concerns. The clinic phone number is [phone].  Please show the CHEMO ALERT CARD at check-in to the Emergency Department and triage nurse.   

## 2018-07-30 ENCOUNTER — Other Ambulatory Visit: Payer: Self-pay | Admitting: Hematology

## 2018-07-30 DIAGNOSIS — Z7189 Other specified counseling: Secondary | ICD-10-CM

## 2018-07-30 DIAGNOSIS — C3491 Malignant neoplasm of unspecified part of right bronchus or lung: Secondary | ICD-10-CM

## 2018-08-08 ENCOUNTER — Inpatient Hospital Stay (HOSPITAL_BASED_OUTPATIENT_CLINIC_OR_DEPARTMENT_OTHER): Payer: Medicare HMO | Admitting: Medical

## 2018-08-08 ENCOUNTER — Other Ambulatory Visit: Payer: Self-pay | Admitting: Medical

## 2018-08-08 ENCOUNTER — Inpatient Hospital Stay: Payer: Medicare HMO

## 2018-08-08 ENCOUNTER — Inpatient Hospital Stay: Payer: Medicare HMO | Attending: Hematology

## 2018-08-08 VITALS — BP 114/75 | HR 82 | Temp 97.9°F | Resp 20

## 2018-08-08 DIAGNOSIS — C3401 Malignant neoplasm of right main bronchus: Secondary | ICD-10-CM | POA: Insufficient documentation

## 2018-08-08 DIAGNOSIS — C3491 Malignant neoplasm of unspecified part of right bronchus or lung: Secondary | ICD-10-CM

## 2018-08-08 DIAGNOSIS — R079 Chest pain, unspecified: Secondary | ICD-10-CM | POA: Diagnosis not present

## 2018-08-08 DIAGNOSIS — E119 Type 2 diabetes mellitus without complications: Secondary | ICD-10-CM | POA: Insufficient documentation

## 2018-08-08 DIAGNOSIS — J449 Chronic obstructive pulmonary disease, unspecified: Secondary | ICD-10-CM | POA: Diagnosis not present

## 2018-08-08 DIAGNOSIS — F1010 Alcohol abuse, uncomplicated: Secondary | ICD-10-CM | POA: Diagnosis not present

## 2018-08-08 DIAGNOSIS — Z7189 Other specified counseling: Secondary | ICD-10-CM

## 2018-08-08 DIAGNOSIS — R630 Anorexia: Secondary | ICD-10-CM | POA: Insufficient documentation

## 2018-08-08 DIAGNOSIS — Z5112 Encounter for antineoplastic immunotherapy: Secondary | ICD-10-CM | POA: Insufficient documentation

## 2018-08-08 DIAGNOSIS — I10 Essential (primary) hypertension: Secondary | ICD-10-CM | POA: Insufficient documentation

## 2018-08-08 DIAGNOSIS — R634 Abnormal weight loss: Secondary | ICD-10-CM | POA: Diagnosis not present

## 2018-08-08 LAB — CMP (CANCER CENTER ONLY)
ALBUMIN: 3.2 g/dL — AB (ref 3.5–5.0)
ALK PHOS: 77 U/L (ref 38–126)
ALT: 8 U/L (ref 0–44)
ANION GAP: 10 (ref 5–15)
AST: 11 U/L — AB (ref 15–41)
BUN: 8 mg/dL (ref 8–23)
CO2: 25 mmol/L (ref 22–32)
Calcium: 9.4 mg/dL (ref 8.9–10.3)
Chloride: 106 mmol/L (ref 98–111)
Creatinine: 0.81 mg/dL (ref 0.61–1.24)
GFR, Est AFR Am: >60 mL/min (ref >60)
GFR, Estimated: >60 mL/min (ref >60)
GLUCOSE: 148 mg/dL — AB (ref 70–99)
POTASSIUM: 4 mmol/L (ref 3.5–5.1)
SODIUM: 141 mmol/L (ref 135–145)
Total Bilirubin: 0.3 mg/dL (ref 0.3–1.2)
Total Protein: 7.6 g/dL (ref 6.5–8.1)

## 2018-08-08 LAB — CBC WITH DIFFERENTIAL/PLATELET
Abs Immature Granulocytes: 0.03 10*3/uL (ref 0.00–0.07)
BASOS ABS: 0 10*3/uL (ref 0.0–0.1)
Basophils Relative: 0 %
Eosinophils Absolute: 0.1 10*3/uL (ref 0.0–0.5)
Eosinophils Relative: 2 %
HCT: 38.4 % — ABNORMAL LOW (ref 39.0–52.0)
HEMOGLOBIN: 12.5 g/dL — AB (ref 13.0–17.0)
Immature Granulocytes: 0 %
Lymphocytes Relative: 10 %
Lymphs Abs: 0.7 10*3/uL (ref 0.7–4.0)
MCH: 29.1 pg (ref 26.0–34.0)
MCHC: 32.6 g/dL (ref 30.0–36.0)
MCV: 89.5 fL (ref 80.0–100.0)
Monocytes Absolute: 0.6 10*3/uL (ref 0.1–1.0)
Monocytes Relative: 9 %
NEUTROS ABS: 5.7 10*3/uL (ref 1.7–7.7)
NEUTROS PCT: 79 %
Platelets: 153 10*3/uL (ref 150–400)
RBC: 4.29 MIL/uL (ref 4.22–5.81)
RDW: 13.5 % (ref 11.5–15.5)
WBC: 7.2 10*3/uL (ref 4.0–10.5)
nRBC: 0 % (ref 0.0–0.2)

## 2018-08-08 MED ORDER — HYDROCODONE-ACETAMINOPHEN 5-325 MG PO TABS: ORAL_TABLET | ORAL | 0 refills | Status: DC

## 2018-08-08 MED ORDER — CLONIDINE HCL 0.1 MG PO TABS
0.2000 mg | ORAL_TABLET | Freq: Once | ORAL | Status: AC
  Administered 2018-08-08: 0.2 mg via ORAL

## 2018-08-08 MED ORDER — CLONIDINE HCL 0.1 MG PO TABS
ORAL_TABLET | ORAL | Status: AC
  Filled 2018-08-08: qty 2

## 2018-08-08 MED ORDER — SODIUM CHLORIDE 0.9 % IV SOLN
Freq: Once | INTRAVENOUS | Status: AC
  Administered 2018-08-08: 11:00:00 via INTRAVENOUS
  Filled 2018-08-08: qty 250

## 2018-08-08 MED ORDER — SODIUM CHLORIDE 0.9 % IV SOLN
10.5000 mg/kg | Freq: Once | INTRAVENOUS | Status: AC
  Administered 2018-08-08: 1000 mg via INTRAVENOUS
  Filled 2018-08-08: qty 20

## 2018-08-08 NOTE — Patient Instructions (Signed)
[facility] Discharge Instructions for Patients Receiving Chemotherapy  Today you received the following chemotherapy agents :  Durvalumab.  To help prevent nausea and vomiting after your treatment, we encourage you to take your nausea medication as prescribed.   If you develop nausea and vomiting that is not controlled by your nausea medication, call the clinic.   BELOW ARE SYMPTOMS THAT SHOULD BE REPORTED IMMEDIATELY:  *FEVER GREATER THAN 100.5 F  *CHILLS WITH OR WITHOUT FEVER  NAUSEA AND VOMITING THAT IS NOT CONTROLLED WITH YOUR NAUSEA MEDICATION  *UNUSUAL SHORTNESS OF BREATH  *UNUSUAL BRUISING OR BLEEDING  TENDERNESS IN MOUTH AND THROAT WITH OR WITHOUT PRESENCE OF ULCERS  *URINARY PROBLEMS  *BOWEL PROBLEMS  UNUSUAL RASH Items with * indicate a potential emergency and should be followed up as soon as possible.  Feel free to call the clinic should you have any questions or concerns. The clinic phone number is [phone].  Please show the [facility] at check-in to the Emergency Department and triage nurse.

## 2018-08-08 NOTE — Progress Notes (Signed)
Lucianne Lei, PA in [facility] saw pt today.  Proceed with chemo as ordered.  Clonidine 0.2 mg po given per order from PA.

## 2018-08-12 NOTE — Progress Notes (Signed)
Symptoms Management Clinic Progress Note   Barry Booker [id number] [birth year] 72 y.o.  Barry Booker is managed by Dr. Sullivan Lone  Actively treated with chemotherapy/immunotherapy: yes  Current Therapy: Imfinzi  Last Treated:  08/08/2018 (Cycle 3, Day 1)  Assessment: Plan:    Essential hypertension - Plan: cloNIDine (CATAPRES) tablet 0.2 mg  Chest pain, unspecified type  Squamous cell lung cancer, right ([place])   Essential hypertension: The patient was noted to have a blood pressure of 148/103 and 164/112.  He has been told to stop his benazepril given that his blood pressure was better.  He was given clonidine 0.2 mg x 1 with improvement in his blood pressure.  He was told to restart his benazepril 20 mg once daily and continue to hold hydralazine 10 mg once daily.  Right chest pain: The patient reported having right chest pain last evening.  He believes it may be associated with the fact that he was not sleeping well.  An EKG was completed which showed normal sinus rhythm at 88 bpm.  No acute or ischemic findings were noted.  Squamous cell carcinoma of the lung: The patient is receiving cycle 3, day 1 of Imfinzi today.  He will follow-up with Dr. Sullivan Lone on 08/22/2018.  Please see After Visit Summary for patient specific instructions.  Future Appointments  Date Time Provider [place]  08/13/2018 10:45 AM Gardiner Barefoot, DPM [facility]  08/22/2018  8:30 AM [facility] LAB 2 [facility] None  08/22/2018  9:00 AM Brunetta Genera, MD [facility] None  08/22/2018  9:30 AM [facility] INFUSION [facility] None  09/05/2018  8:00 AM [facility] LAB 2 [facility] None  09/05/2018  9:00 AM [facility] INFUSION [facility] None    No orders of the defined types were placed in this encounter.      Subjective:   Patient ID:  Barry Booker is a 72 y.o. (DOB [birth year]) male.  Chief Complaint: No chief complaint on file.   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  The patient was seen in the infusion room today for elevated blood pressure.  He is receiving cycle 3 of Imfinzi today.  He reports that he had previously been on benazepril 20 mg once daily and hydralazine 10 mg once daily but was told to stop these medications as his blood pressure had improved.  His blood pressure today was noted to be at 148/103 and repeated at 164/112.  He reports that he was having difficulty sleeping last night and also had right-sided chest pain.  This is resolved.  The patient has a history of diabetes.  He denies any other issues of concern.  Medications: I have reviewed the patient's current medications.  Allergies:  Allergies  Allergen Reactions  . Neomycin-Bacitracin Zn-Polymyx Rash    Past Medical History:  Diagnosis Date  . Alcohol abuse   . COPD (chronic obstructive pulmonary disease) ([place])   . Diabetes mellitus without complication ([place])   . Gout   . High cholesterol   . Hypertension   . Malignant neoplasm of right upper lobe of lung ([place]) 03/29/2018    Past Surgical History:  Procedure Laterality Date  . COLONOSCOPY    . FLEXIBLE BRONCHOSCOPY [name] 03/21/2018   Procedure: FLEXIBLE BRONCHOSCOPY;  Surgeon: Marshell Garfinkel, MD;  Location: [facility];  Service: Pulmonary;  Laterality: [name];  .  NOSE SURGERY  90's  . VIDEO BRONCHOSCOPY WITH ENDOBRONCHIAL ULTRASOUND [name] 03/21/2018   Procedure: VIDEO BRONCHOSCOPY WITH ENDOBRONCHIAL ULTRASOUND;  Surgeon: Marshell Garfinkel, MD;  Location: [facility];  Service: Pulmonary;  Laterality: [name];    Family History  Problem Relation Age of Onset  . Arthritis Mother   . Cancer Father   . Cancer Sister   . Heart disease Maternal Uncle   . Colon cancer Neg Hx     Social History   Socioeconomic History  . Marital status: Married    Spouse name: Not on file  . Number of  children: Not on file  . Years of education: Not on file  . Highest education level: Not on file  Occupational History  . Not on file  Social Needs  . Financial resource strain: Not on file  . Food insecurity:    Worry: Not on file    Inability: Not on file  . Transportation needs:    Medical: Not on file    Non-medical: Not on file  Tobacco Use  . Smoking status: Former Smoker    Packs/day: 0.25    Years: 50.00    Pack years: 12.50    Types: Cigarettes    Last attempt to quit: 07/2017    Years since quitting: 1.1  . Smokeless tobacco: Never Used  . Tobacco comment: quit smoking 07/2017  Substance and Sexual Activity  . Alcohol use: Yes    Alcohol/week: 14.0 - 21.0 standard drinks    Types: 14 - 21 Cans of beer per week  . Drug use: No  . Sexual activity: Not on file  Lifestyle  . Physical activity:    Days per week: Not on file    Minutes per session: Not on file  . Stress: Not on file  Relationships  . Social connections:    Talks on phone: Not on file    Gets together: Not on file    Attends religious service: Not on file    Active member of club or organization: Not on file    Attends meetings of clubs or organizations: Not on file    Relationship status: Not on file  . Intimate partner violence:    Fear of current or ex partner: Not on file    Emotionally abused: Not on file    Physically abused: Not on file    Forced sexual activity: Not on file  Other Topics Concern  . Not on file  Social History Narrative  . Not on file    Past Medical History, Surgical history, Social history, and Family history were reviewed and updated as appropriate.   Please see review of systems for further details on the patient's review from today.   Review of Systems:  Review of Systems  Constitutional: Negative for chills, diaphoresis and fever.  HENT: Negative for trouble swallowing and voice change.   Respiratory: Negative for cough, chest tightness, shortness of breath  and wheezing.   Cardiovascular: Positive for chest pain. Negative for palpitations.  Gastrointestinal: Negative for abdominal pain, constipation, diarrhea, nausea and vomiting.  Musculoskeletal: Negative for back pain and myalgias.  Neurological: Negative for dizziness, light-headedness and headaches.  Psychiatric/Behavioral: Positive for sleep disturbance.    Objective:   Physical Exam:  There were no vitals taken for this visit. ECOG: 0  Physical Exam  Constitutional: No distress.  HENT:  Head: Normocephalic and atraumatic.  Cardiovascular: Normal rate, regular rhythm and normal heart sounds. Exam reveals no gallop and no  friction rub.  No murmur heard. Pulmonary/Chest: Effort normal and breath sounds normal. No respiratory distress. He has no wheezes. He has no rales.  Neurological: He is alert.  Skin: Skin is warm and dry. No rash noted. He is not diaphoretic. No erythema.    Lab Review:     Component Value Date/Time   NA 141 08/08/2018 0802   K 4.0 08/08/2018 0802   CL 106 08/08/2018 0802   CO2 25 08/08/2018 0802   GLUCOSE 148 (H) 08/08/2018 0802   BUN 8 08/08/2018 0802   CREATININE 0.81 08/08/2018 0802   CREATININE 0.88 02/09/2016 1014   CALCIUM 9.4 08/08/2018 0802   PROT 7.6 08/08/2018 0802   ALBUMIN 3.2 (L) 08/08/2018 0802   AST 11 (L) 08/08/2018 0802   ALT 8 08/08/2018 0802   ALKPHOS 77 08/08/2018 0802   BILITOT 0.3 08/08/2018 0802   GFRNONAA >60 08/08/2018 0802   GFRAA >60 08/08/2018 0802       Component Value Date/Time   WBC 7.2 08/08/2018 0802   RBC 4.29 08/08/2018 0802   HGB 12.5 (L) 08/08/2018 0802   HGB 10.2 (L) 04/07/2018 0855   HCT 38.4 (L) 08/08/2018 0802   PLT 153 08/08/2018 0802   PLT 308 04/07/2018 0855   MCV 89.5 08/08/2018 0802   MCH 29.1 08/08/2018 0802   MCHC 32.6 08/08/2018 0802   RDW 13.5 08/08/2018 0802   LYMPHSABS 0.7 08/08/2018 0802   MONOABS 0.6 08/08/2018 0802   EOSABS 0.1 08/08/2018 0802   BASOSABS 0.0 08/08/2018 0802    -------------------------------  Imaging from last 24 hours (if applicable):  Radiology interpretation: No results found.

## 2018-08-13 ENCOUNTER — Encounter: Payer: Self-pay | Admitting: Podiatry

## 2018-08-13 ENCOUNTER — Ambulatory Visit: Payer: Medicare HMO | Admitting: Podiatry

## 2018-08-13 DIAGNOSIS — E119 Type 2 diabetes mellitus without complications: Secondary | ICD-10-CM | POA: Diagnosis not present

## 2018-08-13 DIAGNOSIS — M79676 Pain in unspecified toe(s): Secondary | ICD-10-CM

## 2018-08-13 DIAGNOSIS — B351 Tinea unguium: Secondary | ICD-10-CM

## 2018-08-13 NOTE — Progress Notes (Signed)
Patient ID: Barry Booker, male   DOB: 03/23/1946, 72 y.o.   MRN: [id number] Complaint:  Visit Type: Patient returns to my office for continued preventative foot care services. Complaint: Patient states" my nails have grown long and thick and become painful to walk and wear shoes" . The patient presents for preventative foot care services. No changes to ROS  Podiatric Exam: Vascular: dorsalis pedis and posterior tibial pulses are palpable bilateral. Capillary return is immediate. Temperature gradient is WNL. Skin turgor WNL  Sensorium: Normal Semmes Weinstein monofilament test. Normal tactile sensation bilaterally. Nail Exam: Pt has thick disfigured discolored nails with subungual debris noted bilateral entire nail hallux through fifth toenails Ulcer Exam: There is no evidence of ulcer or pre-ulcerative changes or infection. Orthopedic Exam: Muscle tone and strength are WNL. No limitations in general ROM. No crepitus or effusions noted. Foot type and digits show no abnormalities. Bony prominences are unremarkable. Skin: No Porokeratosis. No infection or ulcers  Diagnosis:  Onychomycosis, , Pain in right toe, pain in left toes  Treatment & Plan Procedures and Treatment: Consent by patient was obtained for treatment procedures. The patient understood the discussion of treatment and procedures well. All questions were answered thoroughly reviewed. Debridement of mycotic and hypertrophic toenails, 1 through 5 bilateral and clearing of subungual debris. No ulceration, no infection noted.  Return Visit-Office Procedure: Patient instructed to return to the office for a follow up visit 3 months for continued evaluation and treatment.  Gardiner Barefoot DPM

## 2018-08-21 ENCOUNTER — Telehealth: Payer: Self-pay | Admitting: *Deleted

## 2018-08-21 NOTE — Telephone Encounter (Signed)
Received call from Ms. Owens Shark. She requested refill of patient's Percocet (hydrocodone) be sent to [facility] on [address].

## 2018-08-21 NOTE — Progress Notes (Signed)
[name]    HEMATOLOGY/ONCOLOGY CLINIC NOTE  Date of Service: 08/22/2018  Patient Care Team: Bernerd Limbo, MD as PCP - General (Family Medicine)  CHIEF COMPLAINTS/PURPOSE OF CONSULTATION:  F/u for mx of lung cancer  HISTORY OF PRESENTING ILLNESS:   Barry Booker is a wonderful 72 y.o. male who has been referred to [place] by Dr Marshell Garfinkel for evaluation and management of presumed newly diagnosed rt sided lung cancer with concern for bronchial obstruction.  Patient has a history of hypertension, dyslipidemia, diabetes, COPD, alcohol abuse and previously had a chest x-ray on 02/20/2018 at [facility] which showed Right upper lobe consolidation/atelectasis may relate to post obstructive pneumonitis/pneumonia. An underlying obstructing lesion should be considered. Consider CT for further assessment.  H subsequently then had a CT of the chest on 02/27/2018 which showed -5.8 x 4.9 cm mass in the right mediastinum and hilum with associated severe narrowing of the right main pulmonary artery as well as right upper lobe and right middle lobe arteries. Obstructed right main stem bronchus and collapse of the right upper lobe and right middle lobe. Small right effusion. The appearance is similar to the patient's chest radiograph from 02/20/2018.  Patient subsequently had a PET CT on 03/10/2018 by Dr. Vaughan Browner which showed Large intensely hypermetabolic RIGHT hilar mass obstructs the RIGHT upper lobe bronchus and surrounds the bronchus intermedius. Post obstructive collapse of the RIGHT upper lobe.  No hypermetabolic mediastinal or supraclavicular lymph nodes. No evidence distant metastatic disease.  Patient had a flexible video fiberoptic bronchoscopy done on 03/21/2018 by Dr. Vaughan Browner which showed which showed some nodularity of the right mainstem bronchial mucosa with near complete occlusion of right main stem. Unable to traverse the occluded part of right main stem. The mucosa was friable with propensity to bleed.  Endobronchial biopsies and brushings were taken from the R mainstem and right upper lobe for pathology.  Preliminary results showed atypical cells however final cytology/pathology is currently pending.  Patient report rt sided chest pain with deep inspiration, anorexia and weight loss of about 30lbs in the last 6 months.  Interval History:   Barry Booker returns to clinic today for management and evaluation of his lung cancer, and C4 Durvalumab. The patient's last visit with [place] was on 07/18/18. He is accompanied today by his partner. The pt reports that he is doing well overall.   The pt reports that he has had some intermittent pain in his outer right chest, right arm, and right, upper side of his back. He notes that this pain presents more when he is sitting still. He is not sure if this pain or new or not. The pt notes that he feels this pain in the middle of the night. The pt is not currently having pain. He also notes that the pain moves, and is sensitive to being pushed on. He denies pain in the center of his back. He denies feeling more SOB.   The pt has gained weight, and has been walking around each day. He notes that he has not had any diarrhea or skin rashes and is tolerating the Durvalumab very well.   Lab results today (08/22/18) of CBC w/diff, CMP is as follows: all values are WNL except for HGB at 12.5, PLT at 142k, Glucose at 158, Albumin at 3.4.  On review of systems, pt reports intermittent moving upper back pain, intermittent right sided chest pain, intermittent upper right arm pain, moving his bowels, and denies SOB, pain in center of back ,  diarrhea, skin rashes, abdominal pains, and any other symptoms.   MEDICAL HISTORY:  Past Medical History:  Diagnosis Date  . Alcohol abuse   . COPD (chronic obstructive pulmonary disease) ([place])   . Diabetes mellitus without complication ([place])   . Gout   . High cholesterol   . Hypertension   . Malignant neoplasm of right upper lobe  of lung ([place]) 03/29/2018    SURGICAL HISTORY: Past Surgical History:  Procedure Laterality Date  . COLONOSCOPY    . FLEXIBLE BRONCHOSCOPY [name] 03/21/2018   Procedure: FLEXIBLE BRONCHOSCOPY;  Surgeon: Marshell Garfinkel, MD;  Location: [place];  Service: Pulmonary;  Laterality: [name];  . NOSE SURGERY  90's  . VIDEO BRONCHOSCOPY WITH ENDOBRONCHIAL ULTRASOUND [name] 03/21/2018   Procedure: VIDEO BRONCHOSCOPY WITH ENDOBRONCHIAL ULTRASOUND;  Surgeon: Marshell Garfinkel, MD;  Location: [place];  Service: Pulmonary;  Laterality: [name];    SOCIAL HISTORY: Social History   Socioeconomic History  . Marital status: Married    Spouse name: Not on file  . Number of children: Not on file  . Years of education: Not on file  . Highest education level: Not on file  Occupational History  . Not on file  Social Needs  . Financial resource strain: Not on file  . Food insecurity:    Worry: Not on file    Inability: Not on file  . Transportation needs:    Medical: Not on file    Non-medical: Not on file  Tobacco Use  . Smoking status: Former Smoker    Packs/day: 0.25    Years: 50.00    Pack years: 12.50    Types: Cigarettes    Last attempt to quit: 07/2017    Years since quitting: 1.1  . Smokeless tobacco: Never Used  . Tobacco comment: quit smoking 07/2017  Substance and Sexual Activity  . Alcohol use: Yes    Alcohol/week: 14.0 - 21.0 standard drinks    Types: 14 - 21 Cans of beer per week  . Drug use: No  . Sexual activity: Not on file  Lifestyle  . Physical activity:    Days per week: Not on file    Minutes per session: Not on file  . Stress: Not on file  Relationships  . Social connections:    Talks on phone: Not on file    Gets together: Not on file    Attends religious service: Not on file    Active member of club or organization: Not on file    Attends meetings of clubs or organizations: Not on file    Relationship status: Not on file  . Intimate partner violence:    Fear of current or ex  partner: Not on file    Emotionally abused: Not on file    Physically abused: Not on file    Forced sexual activity: Not on file  Other Topics Concern  . Not on file  Social History Narrative  . Not on file    FAMILY HISTORY: Family History  Problem Relation Age of Onset  . Arthritis Mother   . Cancer Father   . Cancer Sister   . Heart disease Maternal Uncle   . Colon cancer Neg Hx     ALLERGIES:  is allergic to neomycin-bacitracin zn-polymyx.  MEDICATIONS:  Current Outpatient Medications  Medication Sig Dispense Refill  . aspirin 81 MG tablet Take 81 mg by mouth daily.    [name] atorvastatin (LIPITOR) 40 MG tablet Take 40 mg by mouth daily.     [name]  benazepril (LOTENSIN) 20 MG tablet     . Calcium Carbonate-Vit D-Min (CALCIUM 600+D PLUS MINERALS) 600-400 MG-UNIT TABS Take by mouth.    . folic acid (FOLVITE) 371 MCG tablet Take 400 mcg by mouth daily.    . furosemide (LASIX) 40 MG tablet TAKE 1 TABLET TWICE DAILY    . hydrALAZINE (APRESOLINE) 10 MG tablet Take by mouth.    [name] HYDROcodone-acetaminophen (NORCO) 5-325 MG tablet 1/2 to 1 tablet 4 times daily as needed for pain. 30 tablet 0  . Ipratropium-Albuterol (COMBIVENT IN) Inhale 2 puffs into the lungs every 6 (six) hours as needed (for shortness of breath/wheezing.).    [name] lidocaine (XYLOCAINE) 2 % solution Use as directed 15 mLs in the mouth or throat every 4 (four) hours as needed (esophagitis). Swish and swallow 100 mL 1  . metFORMIN (GLUCOPHAGE) 500 MG tablet Take 500 mg by mouth every morning.    . potassium chloride SA (K-DUR,KLOR-CON) 20 MEQ tablet Take 20 mEq by mouth daily.     . prochlorperazine (COMPAZINE) 10 MG tablet TAKE 1 TABLET BY MOUTH EVERY 6 HOURS AS NEEDED FOR NAUSEA AND VOMITING 30 tablet 1  . sucralfate (CARAFATE) 1 g tablet Take 1 tablet (1 g total) by mouth 4 (four) times daily -  with meals and at bedtime. 5 min before meals for radiation induced esophagitis 120 tablet 2  . umeclidinium-vilanterol (ANORO  ELLIPTA) 62.5-25 MCG/INH AEPB Inhale 1 puff into the lungs daily.      No current facility-administered medications for this visit.     REVIEW OF SYSTEMS:    A 10+ POINT REVIEW OF SYSTEMS WAS OBTAINED including neurology, dermatology, psychiatry, cardiac, respiratory, lymph, extremities, GI, GU, Musculoskeletal, constitutional, breasts, reproductive, HEENT.  All pertinent positives are noted in the HPI.  All others are negative.   PHYSICAL EXAMINATION: ECOG PERFORMANCE STATUS: 1 - Symptomatic but completely ambulatory  Vitals:   08/22/18 0944  BP: (!) 162/90  Pulse: 100  Resp: 18  Temp: 97.6 F (36.4 C)  SpO2: 97%   Filed Weights   08/22/18 0944  Weight: 224 lb 8 oz (101.8 kg)   .Body mass index is 32.21 kg/m.  GENERAL:alert, in no acute distress and comfortable SKIN: no acute rashes, no significant lesions EYES: conjunctiva are pink and non-injected, sclera anicteric OROPHARYNX: MMM, no exudates, no oropharyngeal erythema or ulceration NECK: supple, no JVD LYMPH:  no palpable lymphadenopathy in the cervical, axillary or inguinal regions LUNGS: decreased air entry right upper and mid zones HEART: regular rate & rhythm ABDOMEN:  normoactive bowel sounds , non tender, not distended. No palpable hepatosplenomegaly.  Extremity: no pedal edema PSYCH: alert & oriented x 3 with fluent speech NEURO: no focal motor/sensory deficits   LABORATORY DATA:  I have reviewed the data as listed . CBC Latest Ref Rng & Units 08/22/2018 08/08/2018 07/18/2018  WBC 4.0 - 10.5 K/uL 7.2 7.2 7.2  Hemoglobin 13.0 - 17.0 g/dL 12.5(L) 12.5(L) 11.8(L)  Hematocrit 39.0 - 52.0 % 39.1 38.4(L) 37.2(L)  Platelets 150 - 400 K/uL 142(L) 153 157    . CMP Latest Ref Rng & Units 08/22/2018 08/08/2018 07/18/2018  Glucose 70 - 99 mg/dL 158(H) 148(H) 117(H)  BUN 8 - 23 mg/dL _0 Creatinine 0.61 - 1.24 mg/dL 0.89 0.81 0.79  Sodium 135 - 145 mmol/L 139 141 141  Potassium 3.5 - 5.1 mmol/L 4.0 4.0 4.2    Chloride 98 - 111 mmol/L 105 106 105  CO2 22 - 32 mmol/L  _0 Calcium 8.9 - 10.3 mg/dL 9.4 9.4 9.7  Total Protein 6.5 - 8.1 g/dL 7.5 7.6 7.6  Total Bilirubin 0.3 - 1.2 mg/dL 0.3 0.3 0.3  Alkaline Phos 38 - 126 U/L 70 77 68  AST 15 - 41 U/L 15 11(L) 16  ALT 0 - 44 U/L _1 03/21/18   03/25/18 Endobronchial bx:     RADIOGRAPHIC STUDIES: I have personally reviewed the radiological images as listed and agreed with the findings in the report. No results found.  ASSESSMENT & PLAN:   72 y.o.  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 Post obstructive collapse of the Right upper lobe. No hypermetabolic mediastinal or supraclavicular lymph nodes. No evidence of distant mets.  06/23/18 PET/CT revealed  Marked reduction in activity associated with the dominant right upper lobe central mass, maximum SUV 3.4 and previously 15.3. Please note that this lesion does still in case and markedly narrows the right upper lobe bronchus. 2. There is some patchy ground-glass opacities with small airspace opacity components in the right upper lobe, right middle lobe, right lower lobe, and lingula. Some of these are hypermetabolic, for example lingular opacity has a maximum SUV of 6.2 and the right lower lobe bandlike opacity 5.7. These are probably inflammatory and merit surveillance. 3. Small to moderate right pleural effusion with accentuated metabolic activity. Malignant effusion not excluded. 4. No findings of metastatic disease to the neck, abdomen/pelvis, or skeleton. 5. Aortic aneurysm NOS. Infrarenal aortic aneurysm  3.8 cm in diameter. Recommend followup by [place] in 2 years. 6. Other imaging findings of potential clinical significance: Chronic paranasal sinusitis. Aortic Atherosclerosis. Coronary atherosclerosis. Hepatic cirrhosis. Ventral hernia containing a loop of small bowel. Suspected late phase healing of the right anterior eighth rib.   2) RUL airway obstruction with RUL lung collapse. 3) Concern for severe narrowing of the right main pulmonary artery as well as right upper lobe and right middle lobe arteries.   PLAN:  -Advised that the pt follow up with his PCP for management of his BP  -TSH WNL on 07/01/2018 -- pending from today --will monitor on PD1 inhibitor therapy -Discussed the plan to complete maintenance Durvalumab for up to one year -Counseled the pt towards complete smoking cessation -Discussed pt labwork today, 08/22/18; blood counts and chemistries are stable  -Recommend Senna S for occasional constipation  -The pt has no prohibitive toxicities from continuing Durvalumab every other week at this time.   -Will repeat CT Chest in 3 weeks, given intermittent upper right back, chest and arm pain -Pt will let me know if pain worsens in the interim -Will see the pt back in 4 weeks    -continue Durvalumab q2weeks with labs - please schedule next 4 doses -CT chest in 3 weeks -RTC with Dr Irene Limbo in 4 weeks   All of the patients questions were answered with apparent satisfaction. The patient knows to call the clinic with any problems, questions or concerns.  The total time spent in the appt was 30 minutes and more than 50% was on counseling and direct patient cares.     Sullivan Lone MD MS AAHIVMS  [facility] Hematology/Oncology Physician [place]  (Office):       [phone] (Work cell):  [phone] (Fax):           [phone]  I, Baldwin Jamaica, am acting as a scribe for Dr. Sullivan Lone.   .I have reviewed the above documentation for accuracy and completeness, and I  agree with the above. Brunetta Genera MD

## 2018-08-22 ENCOUNTER — Inpatient Hospital Stay (HOSPITAL_BASED_OUTPATIENT_CLINIC_OR_DEPARTMENT_OTHER): Payer: Medicare HMO | Admitting: Hematology

## 2018-08-22 ENCOUNTER — Inpatient Hospital Stay: Payer: Medicare HMO

## 2018-08-22 ENCOUNTER — Other Ambulatory Visit: Payer: Self-pay | Admitting: Hematology

## 2018-08-22 VITALS — BP 162/90 | HR 100 | Temp 97.6°F | Resp 18 | Ht 70.0 in | Wt 224.5 lb

## 2018-08-22 DIAGNOSIS — R079 Chest pain, unspecified: Secondary | ICD-10-CM | POA: Diagnosis not present

## 2018-08-22 DIAGNOSIS — C3401 Malignant neoplasm of right main bronchus: Secondary | ICD-10-CM

## 2018-08-22 DIAGNOSIS — E119 Type 2 diabetes mellitus without complications: Secondary | ICD-10-CM | POA: Diagnosis not present

## 2018-08-22 DIAGNOSIS — Z5112 Encounter for antineoplastic immunotherapy: Secondary | ICD-10-CM | POA: Diagnosis not present

## 2018-08-22 DIAGNOSIS — F1010 Alcohol abuse, uncomplicated: Secondary | ICD-10-CM

## 2018-08-22 DIAGNOSIS — R634 Abnormal weight loss: Secondary | ICD-10-CM

## 2018-08-22 DIAGNOSIS — R7989 Other specified abnormal findings of blood chemistry: Secondary | ICD-10-CM

## 2018-08-22 DIAGNOSIS — I10 Essential (primary) hypertension: Secondary | ICD-10-CM | POA: Diagnosis not present

## 2018-08-22 DIAGNOSIS — C3491 Malignant neoplasm of unspecified part of right bronchus or lung: Secondary | ICD-10-CM

## 2018-08-22 DIAGNOSIS — R630 Anorexia: Secondary | ICD-10-CM

## 2018-08-22 DIAGNOSIS — Z7189 Other specified counseling: Secondary | ICD-10-CM

## 2018-08-22 DIAGNOSIS — J449 Chronic obstructive pulmonary disease, unspecified: Secondary | ICD-10-CM

## 2018-08-22 LAB — CMP (CANCER CENTER ONLY)
ALK PHOS: 70 U/L (ref 38–126)
ALT: 10 U/L (ref 0–44)
ANION GAP: 8 (ref 5–15)
AST: 15 U/L (ref 15–41)
Albumin: 3.4 g/dL — ABNORMAL LOW (ref 3.5–5.0)
BILIRUBIN TOTAL: 0.3 mg/dL (ref 0.3–1.2)
BUN: 13 mg/dL (ref 8–23)
CALCIUM: 9.4 mg/dL (ref 8.9–10.3)
CO2: 26 mmol/L (ref 22–32)
CREATININE: 0.89 mg/dL (ref 0.61–1.24)
Chloride: 105 mmol/L (ref 98–111)
GFR, Estimated: >60 mL/min (ref >60)
Glucose, Bld: 158 mg/dL — ABNORMAL HIGH (ref 70–99)
Potassium: 4 mmol/L (ref 3.5–5.1)
Sodium: 139 mmol/L (ref 135–145)
Total Protein: 7.5 g/dL (ref 6.5–8.1)

## 2018-08-22 LAB — CBC WITH DIFFERENTIAL/PLATELET
Abs Immature Granulocytes: 0.02 10*3/uL (ref 0.00–0.07)
Basophils Absolute: 0 10*3/uL (ref 0.0–0.1)
Basophils Relative: 0 %
EOS ABS: 0.2 10*3/uL (ref 0.0–0.5)
EOS PCT: 3 %
HCT: 39.1 % (ref 39.0–52.0)
HEMOGLOBIN: 12.5 g/dL — AB (ref 13.0–17.0)
Immature Granulocytes: 0 %
LYMPHS PCT: 9 %
Lymphs Abs: 0.7 10*3/uL (ref 0.7–4.0)
MCH: 28.6 pg (ref 26.0–34.0)
MCHC: 32 g/dL (ref 30.0–36.0)
MCV: 89.5 fL (ref 80.0–100.0)
MONO ABS: 0.7 10*3/uL (ref 0.1–1.0)
Monocytes Relative: 10 %
Neutro Abs: 5.6 10*3/uL (ref 1.7–7.7)
Neutrophils Relative %: 78 %
Platelets: 142 10*3/uL — ABNORMAL LOW (ref 150–400)
RBC: 4.37 MIL/uL (ref 4.22–5.81)
RDW: 13.2 % (ref 11.5–15.5)
WBC: 7.2 10*3/uL (ref 4.0–10.5)
nRBC: 0 % (ref 0.0–0.2)

## 2018-08-22 MED ORDER — HYDROCODONE-ACETAMINOPHEN 5-325 MG PO TABS: ORAL_TABLET | ORAL | 0 refills | Status: DC

## 2018-08-22 MED ORDER — SODIUM CHLORIDE 0.9 % IV SOLN
10.5000 mg/kg | Freq: Once | INTRAVENOUS | Status: AC
  Administered 2018-08-22: 1000 mg via INTRAVENOUS
  Filled 2018-08-22: qty 20

## 2018-08-22 MED ORDER — SODIUM CHLORIDE 0.9 % IV SOLN
Freq: Once | INTRAVENOUS | Status: AC
  Administered 2018-08-22: 11:00:00 via INTRAVENOUS
  Filled 2018-08-22: qty 250

## 2018-08-22 NOTE — Progress Notes (Signed)
Pt has a hx of HTN, He took PB meds this morning

## 2018-08-22 NOTE — Patient Instructions (Signed)
[facility] Discharge Instructions for Patients Receiving Chemotherapy  Today you received the following chemotherapy agents :  Durvalumab.  To help prevent nausea and vomiting after your treatment, we encourage you to take your nausea medication as prescribed.   If you develop nausea and vomiting that is not controlled by your nausea medication, call the clinic.   BELOW ARE SYMPTOMS THAT SHOULD BE REPORTED IMMEDIATELY:  *FEVER GREATER THAN 100.5 F  *CHILLS WITH OR WITHOUT FEVER  NAUSEA AND VOMITING THAT IS NOT CONTROLLED WITH YOUR NAUSEA MEDICATION  *UNUSUAL SHORTNESS OF BREATH  *UNUSUAL BRUISING OR BLEEDING  TENDERNESS IN MOUTH AND THROAT WITH OR WITHOUT PRESENCE OF ULCERS  *URINARY PROBLEMS  *BOWEL PROBLEMS  UNUSUAL RASH Items with * indicate a potential emergency and should be followed up as soon as possible.  Feel free to call the clinic should you have any questions or concerns. The clinic phone number is [phone].  Please show the [facility] at check-in to the Emergency Department and triage nurse.

## 2018-08-29 ENCOUNTER — Emergency Department (HOSPITAL_COMMUNITY)
Admission: EM | Admit: 2018-08-29 | Discharge: 2018-08-30 | Disposition: A | Discharge status: Home / Self Care | Payer: Medicare HMO | Attending: Emergency Medicine | Admitting: Emergency Medicine

## 2018-08-29 ENCOUNTER — Other Ambulatory Visit: Payer: Self-pay

## 2018-08-29 ENCOUNTER — Emergency Department (HOSPITAL_COMMUNITY): Payer: Medicare HMO

## 2018-08-29 ENCOUNTER — Encounter (HOSPITAL_COMMUNITY): Payer: Self-pay | Admitting: Emergency Medicine

## 2018-08-29 DIAGNOSIS — Z79899 Other long term (current) drug therapy: Secondary | ICD-10-CM | POA: Insufficient documentation

## 2018-08-29 DIAGNOSIS — R0602 Shortness of breath: Secondary | ICD-10-CM | POA: Diagnosis present

## 2018-08-29 DIAGNOSIS — Z87891 Personal history of nicotine dependence: Secondary | ICD-10-CM | POA: Insufficient documentation

## 2018-08-29 DIAGNOSIS — C3490 Malignant neoplasm of unspecified part of unspecified bronchus or lung: Secondary | ICD-10-CM | POA: Insufficient documentation

## 2018-08-29 DIAGNOSIS — J441 Chronic obstructive pulmonary disease with (acute) exacerbation: Secondary | ICD-10-CM | POA: Diagnosis not present

## 2018-08-29 DIAGNOSIS — I10 Essential (primary) hypertension: Secondary | ICD-10-CM | POA: Diagnosis not present

## 2018-08-29 DIAGNOSIS — E119 Type 2 diabetes mellitus without complications: Secondary | ICD-10-CM | POA: Diagnosis not present

## 2018-08-29 LAB — COMPREHENSIVE METABOLIC PANEL
ALT: 13 U/L (ref 0–44)
AST: 19 U/L (ref 15–41)
Albumin: 3.9 g/dL (ref 3.5–5.0)
Alkaline Phosphatase: 59 U/L (ref 38–126)
Anion gap: 9 (ref 5–15)
BUN: 17 mg/dL (ref 8–23)
CHLORIDE: 106 mmol/L (ref 98–111)
CO2: 25 mmol/L (ref 22–32)
Calcium: 9.5 mg/dL (ref 8.9–10.3)
Creatinine, Ser: 0.89 mg/dL (ref 0.61–1.24)
GFR calc Af Amer: >60 mL/min (ref >60)
GFR calc non Af Amer: >60 mL/min (ref >60)
Glucose, Bld: 172 mg/dL — ABNORMAL HIGH (ref 70–99)
Potassium: 4.2 mmol/L (ref 3.5–5.1)
Sodium: 140 mmol/L (ref 135–145)
Total Bilirubin: 0.2 mg/dL — ABNORMAL LOW (ref 0.3–1.2)
Total Protein: 7.9 g/dL (ref 6.5–8.1)

## 2018-08-29 LAB — CBC WITH DIFFERENTIAL/PLATELET
ABS IMMATURE GRANULOCYTES: 0.05 10*3/uL (ref 0.00–0.07)
BASOS PCT: 0 %
Basophils Absolute: 0 10*3/uL (ref 0.0–0.1)
Eosinophils Absolute: 0.1 10*3/uL (ref 0.0–0.5)
Eosinophils Relative: 1 %
HCT: 40.3 % (ref 39.0–52.0)
Hemoglobin: 12.6 g/dL — ABNORMAL LOW (ref 13.0–17.0)
Immature Granulocytes: 1 %
Lymphocytes Relative: 7 %
Lymphs Abs: 0.7 10*3/uL (ref 0.7–4.0)
MCH: 28.6 pg (ref 26.0–34.0)
MCHC: 31.3 g/dL (ref 30.0–36.0)
MCV: 91.6 fL (ref 80.0–100.0)
Monocytes Absolute: 0.7 10*3/uL (ref 0.1–1.0)
Monocytes Relative: 7 %
NEUTROS ABS: 8.6 10*3/uL — AB (ref 1.7–7.7)
Neutrophils Relative %: 84 %
Platelets: 188 10*3/uL (ref 150–400)
RBC: 4.4 MIL/uL (ref 4.22–5.81)
RDW: 13.2 % (ref 11.5–15.5)
WBC: 10.2 10*3/uL (ref 4.0–10.5)
nRBC: 0 % (ref 0.0–0.2)

## 2018-08-29 LAB — URINALYSIS, ROUTINE W REFLEX MICROSCOPIC
Bilirubin Urine: NEGATIVE
Glucose, UA: NEGATIVE mg/dL
Hgb urine dipstick: NEGATIVE
Ketones, ur: NEGATIVE mg/dL
Leukocytes, UA: NEGATIVE
NITRITE: NEGATIVE
Protein, ur: NEGATIVE mg/dL
Specific Gravity, Urine: 1.019 (ref 1.005–1.030)
pH: 5 (ref 5.0–8.0)

## 2018-08-29 MED ORDER — DOXYCYCLINE HYCLATE 100 MG PO TABS
100.0000 mg | ORAL_TABLET | Freq: Once | ORAL | Status: AC
  Administered 2018-08-29: 100 mg via ORAL
  Filled 2018-08-29: qty 1

## 2018-08-29 MED ORDER — ALBUTEROL SULFATE HFA 108 (90 BASE) MCG/ACT IN AERS
2.0000 | INHALATION_SPRAY | RESPIRATORY_TRACT | Status: DC
  Administered 2018-08-30: 2 via RESPIRATORY_TRACT
  Filled 2018-08-29: qty 6.7

## 2018-08-29 MED ORDER — ALBUTEROL SULFATE (2.5 MG/3ML) 0.083% IN NEBU
5.0000 mg | INHALATION_SOLUTION | Freq: Once | RESPIRATORY_TRACT | Status: AC
  Administered 2018-08-29: 5 mg via RESPIRATORY_TRACT
  Filled 2018-08-29: qty 6

## 2018-08-29 MED ORDER — DOXYCYCLINE HYCLATE 100 MG PO CAPS: 100.0000 mg | ORAL_CAPSULE | Freq: Two times a day (BID) | ORAL | 0 refills | Status: DC

## 2018-08-29 MED ORDER — IPRATROPIUM-ALBUTEROL 0.5-2.5 (3) MG/3ML IN SOLN
3.0000 mL | Freq: Once | RESPIRATORY_TRACT | Status: AC
  Administered 2018-08-29: 3 mL via RESPIRATORY_TRACT
  Filled 2018-08-29: qty 3

## 2018-08-29 MED ORDER — HYDROCODONE-ACETAMINOPHEN 5-325 MG PO TABS
2.0000 | ORAL_TABLET | Freq: Once | ORAL | Status: AC
  Administered 2018-08-29: 2 via ORAL
  Filled 2018-08-29: qty 2

## 2018-08-29 NOTE — ED Provider Notes (Signed)
[place] DEPT Provider Note   CSN: [id number] Arrival date & time: 08/29/18  1924     History   Chief Complaint Chief Complaint  Patient presents with  . Shortness of Breath    HPI Barry Booker is a 72 y.o. male.  The history is provided by the patient. No language interpreter was used.  Shortness of Breath  This is a new problem. The problem occurs continuously.The current episode started less than 1 hour ago. The problem has been gradually worsening. Pertinent negatives include no fever. He has tried nothing for the symptoms. The treatment provided no relief. He has had no prior hospitalizations. Associated medical issues include pneumonia and chronic lung disease.  Pt has Lung cancer,  Pt was eating soup tonight and got choked.  Pt has been coughing since.  Pt complains of shortness of breath.   Past Medical History:  Diagnosis Date  . Alcohol abuse   . COPD (chronic obstructive pulmonary disease) ([place])   . Diabetes mellitus without complication ([place])   . Gout   . High cholesterol   . Hypertension   . Malignant neoplasm of right upper lobe of lung ([place]) 03/29/2018    Patient Active Problem List   Diagnosis Date Noted  . Squamous cell lung cancer, right ([place]) 04/04/2018  . Counseling regarding advance care planning and goals of care 04/04/2018  . Malignant neoplasm of right upper lobe of lung ([place]) 03/29/2018  . Anorexia nervosa   . Airway obstruction, anatomic   . Loss of weight   . Respiratory distress 03/21/2018  . Lung mass 03/14/2018  . Chronic diastolic CHF (congestive heart failure) ([place]) 02/09/2016  . History of pulmonary embolism 02/09/2016  . Fecal occult blood test positive 07/05/2015  . Local edema 06/28/2015  . Type 2 diabetes mellitus ([place]) 01/07/2015  . Excessive urination at night 06/22/2014  . Encounter for screening for cardiovascular disorders 01/29/2014  . Adiposity 01/19/2014  . Chronic obstructive  pulmonary disease ([place]) 07/07/2013  . Hypertensive heart disease 06/22/2013  . Disease of skin and subcutaneous tissue 12/25/2012  . Disease of nail 11/25/2012  . Encounter for screening for eye and ear disorders 07/29/2012  . Abnormal blood chemistry 12/10/2011  . 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 Nondependent alcohol abuse 06/05/2011  . Chronic nonalcoholic liver disease 60/07/9322  . Thrombocytopenia ([place]) 07/12/2010  . ABNORMAL ABDOMINAL ULTRASOUND 09/14/2008  . HLD (hyperlipidemia) 09/08/2007  . ALCOHOL ABUSE 02/25/2007  . TOBACCO ABUSE 02/25/2007  . INSOMNIA 02/25/2007  . DEPENDENCE, ALCOHOL NEC/NOS, IN REMISSION 02/19/2007  . HYPERTENSION 02/19/2007  . COPD 02/19/2007    Past Surgical History:  Procedure Laterality Date  . COLONOSCOPY    . FLEXIBLE BRONCHOSCOPY [name] 03/21/2018   Procedure: FLEXIBLE BRONCHOSCOPY;  Surgeon: Marshell Garfinkel, MD;  Location: [place];  Service: Pulmonary;  Laterality: [name];  . NOSE SURGERY  90's  . VIDEO BRONCHOSCOPY WITH ENDOBRONCHIAL ULTRASOUND [name] 03/21/2018   Procedure: VIDEO BRONCHOSCOPY WITH ENDOBRONCHIAL ULTRASOUND;  Surgeon: Marshell Garfinkel, MD;  Location: [place];  Service: Pulmonary;  Laterality: [name];        Home Medications    Prior to Admission medications   Medication Sig Start Date End Date Taking? Authorizing Provider  aspirin 81 MG tablet Take 81 mg by mouth daily.    [provider]  atorvastatin (LIPITOR) 40 MG tablet Take 40 mg by mouth daily.  10/02/13   [provider]  benazepril (LOTENSIN) 20 MG tablet  05/29/18  [provider]  Calcium Carbonate-Vit D-Min (CALCIUM 600+D PLUS MINERALS) 600-400 MG-UNIT TABS Take by mouth.    [provider]  folic acid (FOLVITE) 657 MCG tablet Take 400 mcg by mouth daily.    [provider]  furosemide (LASIX) 40 MG tablet TAKE 1 TABLET TWICE DAILY 05/21/17   [provider]  hydrALAZINE (APRESOLINE) 10 MG tablet Take by mouth.  10/30/16   [provider]  HYDROcodone-acetaminophen (NORCO) 5-325 MG tablet 1/2 to 1 tablet 4 times daily as needed for pain. 08/22/18   Brunetta Genera, MD  Ipratropium-Albuterol (COMBIVENT IN) Inhale 2 puffs into the lungs every 6 (six) hours as needed (for shortness of breath/wheezing.).    [provider]  lidocaine (XYLOCAINE) 2 % solution Use as directed 15 mLs in the mouth or throat every 4 (four) hours as needed (esophagitis). Swish and swallow Patient not taking: Reported on 08/22/2018 05/08/18   Hayden Pedro, PA-C  metFORMIN (GLUCOPHAGE) 500 MG tablet Take 500 mg by mouth every morning.    [provider]  potassium chloride SA (K-DUR,KLOR-CON) 20 MEQ tablet Take 20 mEq by mouth daily.  02/02/16   [provider]  prochlorperazine (COMPAZINE) 10 MG tablet TAKE 1 TABLET BY MOUTH EVERY 6 HOURS AS NEEDED FOR NAUSEA AND VOMITING 07/31/18   Brunetta Genera, MD  sucralfate (CARAFATE) 1 g tablet Take 1 tablet (1 g total) by mouth 4 (four) times daily -  with meals and at bedtime. 5 min before meals for radiation induced esophagitis Patient not taking: Reported on 08/22/2018 05/02/18   Tyler Pita, MD  umeclidinium-vilanterol [facility] ELLIPTA) 62.5-25 MCG/INH AEPB Inhale 1 puff into the lungs daily.  10/31/16   [provider]    Family History Family History  Problem Relation Age of Onset  . Arthritis Mother   . Cancer Father   . Cancer Sister   . Heart disease Maternal Uncle   . Colon cancer Neg Hx     Social History Social History   Tobacco Use  . Smoking status: Former Smoker    Packs/day: 0.25    Years: 50.00    Pack years: 12.50    Types: Cigarettes    Last attempt to quit: 07/2017    Years since quitting: 1.1  . Smokeless tobacco: Never Used  . Tobacco comment: quit smoking 07/2017  Substance Use Topics  . Alcohol use: Yes    Alcohol/week: 14.0 - 21.0 standard drinks    Types: 14 - 21 Cans of beer per week    . Drug use: No     Allergies   Neomycin-bacitracin zn-polymyx   Review of Systems Review of Systems  Constitutional: Negative for fever.  Respiratory: Positive for shortness of breath.   All other systems reviewed and are negative.    Physical Exam Updated Vital Signs There were no vitals taken for this visit.  Physical Exam  Constitutional: He appears well-developed and well-nourished.  HENT:  Head: Normocephalic and atraumatic.  Mouth/Throat: Oropharynx is clear and moist.  Eyes: Pupils are equal, round, and reactive to light. Conjunctivae and EOM are normal.  Neck: Neck supple.  Cardiovascular: Normal rate and regular rhythm.  No murmur heard. Pulmonary/Chest: Effort normal. No respiratory distress. He has wheezes in the right upper field, the right middle field, the right lower field, the left upper field, the left middle field and the left lower field.  Abdominal: Soft. There is no tenderness.  Musculoskeletal: He exhibits no edema.  Right lower leg: Normal.       Left lower leg: Normal.  Neurological: He is alert.  Skin: Skin is warm and dry.  Psychiatric: He has a normal mood and affect.  Nursing note and vitals reviewed.    ED Treatments / Results  Labs (all labs ordered are listed, but only abnormal results are displayed) Labs Reviewed  CBC WITH DIFFERENTIAL/PLATELET  COMPREHENSIVE METABOLIC PANEL  URINALYSIS, ROUTINE W REFLEX MICROSCOPIC    EKG None  Radiology Dg Chest 2 View  Result Date: 08/29/2018 CLINICAL DATA:  Shortness of breath. EXAM: CHEST - 2 VIEW COMPARISON:  Single-view of the chest 03/22/2018. PET CT scan 06/23/2018. FINDINGS: Small, chronic right pleural effusion and volume loss in the right chest are seen. The patient's known right upper lobe mass lesion is difficult to visualize on this examination. The left lung is clear. Heart size is normal. No pneumothorax or pleural effusion. IMPRESSION: Volume loss in the right chest is  presumably related to treatment for lung carcinoma. Small, chronic right pleural effusion. Electronically Signed   By: Inge Rise M.D.   On: 08/29/2018 19:59    Procedures Procedures (including critical care time)  Medications Ordered in ED Medications  ipratropium-albuterol (DUONEB) 0.5-2.5 (3) MG/3ML nebulizer solution 3 mL (has no administration in time range)     Initial Impression / Assessment and Plan / ED Course  I have reviewed the triage vital signs and the nursing notes.  Pertinent labs & imaging results that were available during my care of the patient were reviewed by me and considered in my medical decision making (see chart for details).     MDM  Chest xray is unchanged.  Pt given albuterol neb. Pt reports he is feeling better,  Pt still wheezing.  Repeat neb.  Pt feels much better. Pt wants to go home.  Pt advised to return if fever or shortness of breath  Pt given albuterol inhaler and rx for doxycycline  Final Clinical Impressions(s) / ED Diagnoses   Final diagnoses:  Shortness of breath  COPD exacerbation [facility])    ED Discharge Orders         Ordered    doxycycline (VIBRAMYCIN) 100 MG capsule  2 times daily     08/29/18 2334        An After Visit Summary was printed and given to the patient.    Fransico Meadow, PA-C 08/30/18 0001    Francine Graven, DO 09/01/18 [phone]

## 2018-08-29 NOTE — Discharge Instructions (Addendum)
See your Physicain for recheck on Monday.  Return if any increase in shortness or breath or fever

## 2018-08-29 NOTE — ED Notes (Signed)
Pt from home with c/o sob after eating soup with large chunks of meat. Pt has slight wheeze in expiration. Pt states this was around 1400. Pt has hx of lung cancer and is undergoing chemo last of which was Friday before last.

## 2018-08-30 ENCOUNTER — Other Ambulatory Visit: Payer: Self-pay | Admitting: Hematology

## 2018-08-30 MED ORDER — HYDROCODONE-ACETAMINOPHEN 5-325 MG PO TABS: ORAL_TABLET | ORAL | 0 refills | Status: DC

## 2018-09-05 ENCOUNTER — Inpatient Hospital Stay: Payer: Medicare HMO

## 2018-09-05 ENCOUNTER — Other Ambulatory Visit: Payer: Self-pay | Admitting: Hematology

## 2018-09-05 ENCOUNTER — Inpatient Hospital Stay: Payer: Medicare HMO | Attending: Hematology

## 2018-09-05 VITALS — BP 163/96 | HR 95 | Temp 98.2°F | Resp 18

## 2018-09-05 DIAGNOSIS — C3401 Malignant neoplasm of right main bronchus: Secondary | ICD-10-CM | POA: Diagnosis present

## 2018-09-05 DIAGNOSIS — C3491 Malignant neoplasm of unspecified part of right bronchus or lung: Secondary | ICD-10-CM

## 2018-09-05 DIAGNOSIS — Z5112 Encounter for antineoplastic immunotherapy: Secondary | ICD-10-CM | POA: Insufficient documentation

## 2018-09-05 DIAGNOSIS — R7989 Other specified abnormal findings of blood chemistry: Secondary | ICD-10-CM

## 2018-09-05 DIAGNOSIS — Z79899 Other long term (current) drug therapy: Secondary | ICD-10-CM | POA: Insufficient documentation

## 2018-09-05 DIAGNOSIS — Z7189 Other specified counseling: Secondary | ICD-10-CM

## 2018-09-05 LAB — CBC WITH DIFFERENTIAL/PLATELET
ABS IMMATURE GRANULOCYTES: 0.03 10*3/uL (ref 0.00–0.07)
BASOS PCT: 0 %
Basophils Absolute: 0 10*3/uL (ref 0.0–0.1)
Eosinophils Absolute: 0.2 10*3/uL (ref 0.0–0.5)
Eosinophils Relative: 2 %
HCT: 38.6 % — ABNORMAL LOW (ref 39.0–52.0)
Hemoglobin: 12.4 g/dL — ABNORMAL LOW (ref 13.0–17.0)
Immature Granulocytes: 0 %
Lymphocytes Relative: 10 %
Lymphs Abs: 0.8 10*3/uL (ref 0.7–4.0)
MCH: 28.4 pg (ref 26.0–34.0)
MCHC: 32.1 g/dL (ref 30.0–36.0)
MCV: 88.5 fL (ref 80.0–100.0)
MONO ABS: 0.8 10*3/uL (ref 0.1–1.0)
Monocytes Relative: 10 %
Neutro Abs: 5.8 10*3/uL (ref 1.7–7.7)
Neutrophils Relative %: 78 %
Platelets: 158 10*3/uL (ref 150–400)
RBC: 4.36 MIL/uL (ref 4.22–5.81)
RDW: 13.2 % (ref 11.5–15.5)
WBC: 7.6 10*3/uL (ref 4.0–10.5)
nRBC: 0 % (ref 0.0–0.2)

## 2018-09-05 LAB — CMP (CANCER CENTER ONLY)
ALT: 11 U/L (ref 0–44)
AST: 14 U/L — ABNORMAL LOW (ref 15–41)
Albumin: 3.4 g/dL — ABNORMAL LOW (ref 3.5–5.0)
Alkaline Phosphatase: 69 U/L (ref 38–126)
Anion gap: 10 (ref 5–15)
BUN: 15 mg/dL (ref 8–23)
CO2: 26 mmol/L (ref 22–32)
Calcium: 9.5 mg/dL (ref 8.9–10.3)
Chloride: 106 mmol/L (ref 98–111)
Creatinine: 0.92 mg/dL (ref 0.61–1.24)
GFR, Est AFR Am: >60 mL/min (ref >60)
GFR, Estimated: >60 mL/min (ref >60)
Glucose, Bld: 106 mg/dL — ABNORMAL HIGH (ref 70–99)
POTASSIUM: 4 mmol/L (ref 3.5–5.1)
Sodium: 142 mmol/L (ref 135–145)
TOTAL PROTEIN: 7.4 g/dL (ref 6.5–8.1)
Total Bilirubin: 0.3 mg/dL (ref 0.3–1.2)

## 2018-09-05 MED ORDER — SODIUM CHLORIDE 0.9 % IV SOLN: 10.0000 mg/kg | Freq: Once | INTRAVENOUS | Status: DC

## 2018-09-05 MED ORDER — SODIUM CHLORIDE 0.9 % IV SOLN
10.4000 mg/kg | Freq: Once | INTRAVENOUS | Status: AC
  Administered 2018-09-05: 1000 mg via INTRAVENOUS
  Filled 2018-09-05: qty 20

## 2018-09-05 MED ORDER — SODIUM CHLORIDE 0.9 % IV SOLN
Freq: Once | INTRAVENOUS | Status: AC
  Administered 2018-09-05: 09:00:00 via INTRAVENOUS
  Filled 2018-09-05: qty 250

## 2018-09-05 NOTE — Patient Instructions (Signed)
[facility] Discharge Instructions for Patients Receiving Chemotherapy  Today you received the following chemotherapy agents :  Durvalumab.  To help prevent nausea and vomiting after your treatment, we encourage you to take your nausea medication as prescribed.   If you develop nausea and vomiting that is not controlled by your nausea medication, call the clinic.   BELOW ARE SYMPTOMS THAT SHOULD BE REPORTED IMMEDIATELY:  *FEVER GREATER THAN 100.5 F  *CHILLS WITH OR WITHOUT FEVER  NAUSEA AND VOMITING THAT IS NOT CONTROLLED WITH YOUR NAUSEA MEDICATION  *UNUSUAL SHORTNESS OF BREATH  *UNUSUAL BRUISING OR BLEEDING  TENDERNESS IN MOUTH AND THROAT WITH OR WITHOUT PRESENCE OF ULCERS  *URINARY PROBLEMS  *BOWEL PROBLEMS  UNUSUAL RASH Items with * indicate a potential emergency and should be followed up as soon as possible.  Feel free to call the clinic should you have any questions or concerns. The clinic phone number is [phone].  Please show the [facility] at check-in to the Emergency Department and triage nurse.

## 2018-09-05 NOTE — Progress Notes (Signed)
Ok to treat with elevated BP per Dr. Irene Limbo

## 2018-09-06 LAB — THYROID PANEL WITH TSH
Free Thyroxine Index: 1.7 (ref 1.2–4.9)
T3 UPTAKE RATIO: 26 % (ref 24–39)
T4, Total: 6.7 ug/dL (ref 4.5–12.0)
TSH: 3.84 u[IU]/mL (ref 0.450–4.500)

## 2018-09-14 ENCOUNTER — Encounter (HOSPITAL_COMMUNITY): Payer: Self-pay

## 2018-09-14 ENCOUNTER — Other Ambulatory Visit: Payer: Self-pay

## 2018-09-14 ENCOUNTER — Emergency Department (HOSPITAL_COMMUNITY)
Admission: EM | Admit: 2018-09-14 | Discharge: 2018-09-14 | Disposition: A | Discharge status: Home / Self Care | Payer: Medicare HMO | Attending: Emergency Medicine | Admitting: Emergency Medicine

## 2018-09-14 ENCOUNTER — Emergency Department (HOSPITAL_COMMUNITY): Payer: Medicare HMO

## 2018-09-14 DIAGNOSIS — R0602 Shortness of breath: Secondary | ICD-10-CM

## 2018-09-14 DIAGNOSIS — Z87891 Personal history of nicotine dependence: Secondary | ICD-10-CM | POA: Insufficient documentation

## 2018-09-14 DIAGNOSIS — Z7982 Long term (current) use of aspirin: Secondary | ICD-10-CM | POA: Diagnosis not present

## 2018-09-14 DIAGNOSIS — R410 Disorientation, unspecified: Secondary | ICD-10-CM | POA: Diagnosis not present

## 2018-09-14 DIAGNOSIS — Z79899 Other long term (current) drug therapy: Secondary | ICD-10-CM | POA: Diagnosis not present

## 2018-09-14 DIAGNOSIS — I110 Hypertensive heart disease with heart failure: Secondary | ICD-10-CM | POA: Insufficient documentation

## 2018-09-14 DIAGNOSIS — J449 Chronic obstructive pulmonary disease, unspecified: Secondary | ICD-10-CM | POA: Diagnosis not present

## 2018-09-14 DIAGNOSIS — Z7984 Long term (current) use of oral hypoglycemic drugs: Secondary | ICD-10-CM | POA: Diagnosis not present

## 2018-09-14 DIAGNOSIS — E119 Type 2 diabetes mellitus without complications: Secondary | ICD-10-CM | POA: Insufficient documentation

## 2018-09-14 DIAGNOSIS — I5032 Chronic diastolic (congestive) heart failure: Secondary | ICD-10-CM | POA: Insufficient documentation

## 2018-09-14 LAB — HEPATIC FUNCTION PANEL
ALT: 14 U/L (ref 0–44)
AST: 19 U/L (ref 15–41)
Albumin: 3.8 g/dL (ref 3.5–5.0)
Alkaline Phosphatase: 46 U/L (ref 38–126)
Bilirubin, Direct: 0.1 mg/dL (ref 0.0–0.2)
Indirect Bilirubin: 0.1 mg/dL — ABNORMAL LOW (ref 0.3–0.9)
Total Bilirubin: 0.2 mg/dL — ABNORMAL LOW (ref 0.3–1.2)
Total Protein: 7.2 g/dL (ref 6.5–8.1)

## 2018-09-14 LAB — URINALYSIS, ROUTINE W REFLEX MICROSCOPIC
Bilirubin Urine: NEGATIVE
Glucose, UA: NEGATIVE mg/dL
Hgb urine dipstick: NEGATIVE
Ketones, ur: NEGATIVE mg/dL
Leukocytes, UA: NEGATIVE
Nitrite: NEGATIVE
Protein, ur: NEGATIVE mg/dL
Specific Gravity, Urine: 1.025 (ref 1.005–1.030)
pH: 5 (ref 5.0–8.0)

## 2018-09-14 LAB — BASIC METABOLIC PANEL
Anion gap: 10 (ref 5–15)
BUN: 19 mg/dL (ref 8–23)
CO2: 27 mmol/L (ref 22–32)
Calcium: 8.8 mg/dL — ABNORMAL LOW (ref 8.9–10.3)
Chloride: 106 mmol/L (ref 98–111)
Creatinine, Ser: 0.95 mg/dL (ref 0.61–1.24)
GFR calc Af Amer: >60 mL/min (ref >60)
GFR calc non Af Amer: >60 mL/min (ref >60)
Glucose, Bld: 119 mg/dL — ABNORMAL HIGH (ref 70–99)
Potassium: 3.6 mmol/L (ref 3.5–5.1)
Sodium: 143 mmol/L (ref 135–145)

## 2018-09-14 LAB — BLOOD GAS, ARTERIAL
Acid-Base Excess: 1.8 mmol/L (ref 0.0–2.0)
Bicarbonate: 26.4 mmol/L (ref 20.0–28.0)
Drawn by: 232811
O2 Content: 2 L/min
O2 Saturation: 98 %
Patient temperature: 97.8
pCO2 arterial: 43 mmHg (ref 32.0–48.0)
pH, Arterial: 7.404 (ref 7.350–7.450)
pO2, Arterial: 110 mmHg — ABNORMAL HIGH (ref 83.0–108.0)

## 2018-09-14 LAB — CBC WITH DIFFERENTIAL/PLATELET
Abs Immature Granulocytes: 0.04 10*3/uL (ref 0.00–0.07)
Basophils Absolute: 0 10*3/uL (ref 0.0–0.1)
Basophils Relative: 0 %
Eosinophils Absolute: 0.2 10*3/uL (ref 0.0–0.5)
Eosinophils Relative: 3 %
HCT: 36.8 % — ABNORMAL LOW (ref 39.0–52.0)
Hemoglobin: 11.4 g/dL — ABNORMAL LOW (ref 13.0–17.0)
Immature Granulocytes: 1 %
Lymphocytes Relative: 11 %
Lymphs Abs: 1 10*3/uL (ref 0.7–4.0)
MCH: 28.6 pg (ref 26.0–34.0)
MCHC: 31 g/dL (ref 30.0–36.0)
MCV: 92.2 fL (ref 80.0–100.0)
Monocytes Absolute: 0.8 10*3/uL (ref 0.1–1.0)
Monocytes Relative: 9 %
Neutro Abs: 6.4 10*3/uL (ref 1.7–7.7)
Neutrophils Relative %: 76 %
Platelets: 138 10*3/uL — ABNORMAL LOW (ref 150–400)
RBC: 3.99 MIL/uL — ABNORMAL LOW (ref 4.22–5.81)
RDW: 13.6 % (ref 11.5–15.5)
WBC: 8.4 10*3/uL (ref 4.0–10.5)
nRBC: 0 % (ref 0.0–0.2)

## 2018-09-14 LAB — AMMONIA: Ammonia: 19 umol/L (ref 9–35)

## 2018-09-14 MED ORDER — PREDNISONE 10 MG PO TABS: 40.0000 mg | ORAL_TABLET | Freq: Every day | ORAL | 0 refills | Status: AC

## 2018-09-14 MED ORDER — IPRATROPIUM-ALBUTEROL 0.5-2.5 (3) MG/3ML IN SOLN
3.0000 mL | Freq: Once | RESPIRATORY_TRACT | Status: AC
  Administered 2018-09-14: 3 mL via RESPIRATORY_TRACT
  Filled 2018-09-14: qty 3

## 2018-09-14 MED ORDER — ALBUTEROL SULFATE HFA 108 (90 BASE) MCG/ACT IN AERS
1.0000 | INHALATION_SPRAY | Freq: Once | RESPIRATORY_TRACT | Status: AC
  Administered 2018-09-14: 1 via RESPIRATORY_TRACT
  Filled 2018-09-14: qty 6.7

## 2018-09-14 MED ORDER — ALBUTEROL SULFATE (2.5 MG/3ML) 0.083% IN NEBU: 5.0000 mg | INHALATION_SOLUTION | Freq: Once | RESPIRATORY_TRACT | Status: DC

## 2018-09-14 NOTE — ED Notes (Signed)
Patient ambulated down hall and back. SpO2 96% on RA with ambulation.

## 2018-09-14 NOTE — ED Triage Notes (Addendum)
Per family member, pt is having difficulty breathing and odd behavior over he last 2 days. She states that he will get up then sit down then go outside and come back in without reasons. He feels SOB, but oxygen saturations are in the upper 90s on room air. Pt states, "it would make sense if I was drunk, but I haven't been drunk in several years." A&Ox4. He answers questions appropriately. Immunotherapy appointment scheduled for Friday. No fevers noted.

## 2018-09-14 NOTE — ED Notes (Signed)
Urine culture sent down to lab with urinalysis. 

## 2018-09-14 NOTE — ED Notes (Signed)
Patient transported to CT 

## 2018-09-14 NOTE — ED Provider Notes (Signed)
[facility] DEPT Provider Note   CSN: [id number] Arrival date & time: 09/14/18  1842     History   Chief Complaint Chief Complaint  Patient presents with  . Shortness of Breath  . Odd Behavior    HPI Barry Booker is a 72 y.o. male with history of alcohol abuse in remission, COPD, diabetes mellitus, hypertension, hyperlipidemia, lung cancer currently undergoing treatment presents for evaluation of acute onset, persistent confusion for the last 2 to 3 days.  Patient was brought in by his significant other and primary caretaker who reports that for the last few days he has been behaving erratically, restless and pacing.  He has been answering questions inappropriately and appears confused.  He endorses shortness of breath which he reports has been ongoing for a long time but thinks it has been worsening more recently. He denies fevers, chills, nausea, vomiting, or abdominal pain.  He also endorses decreased urine output but denies dysuria or hematuria.  His significant other also notes worsening bilateral lower extremity edema over the last week or so.  No slurred speech, facial droop, or focal weakness though he has been generally fatigued and weak at home.  He has been taking his home medications but has not tried anything else for his symptoms.  The history is provided by the patient and a significant other.    Past Medical History:  Diagnosis Date  . Alcohol abuse   . COPD (chronic obstructive pulmonary disease) ([place])   . Diabetes mellitus without complication ([place])   . Gout   . High cholesterol   . Hypertension   . Malignant neoplasm of right upper lobe of lung ([place]) 03/29/2018    Patient Active Problem List   Diagnosis Date Noted  . 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 Counseling regarding advance care planning and goals of care 04/04/2018  . Malignant neoplasm of right upper lobe of lung ([place]) 03/29/2018  . Anorexia nervosa     . Airway obstruction, anatomic   . Loss of weight   . Respiratory distress 03/21/2018  . Lung mass 03/14/2018  . Chronic diastolic CHF (congestive heart failure) ([place]) 02/09/2016  . History of pulmonary embolism 02/09/2016  . Fecal occult blood test positive 07/05/2015  . Local edema 06/28/2015  . Type 2 diabetes mellitus ([place]) 01/07/2015  . Excessive urination at night 06/22/2014  . Encounter for screening for cardiovascular disorders 01/29/2014  . Adiposity 01/19/2014  . Chronic obstructive pulmonary disease ([place]) 07/07/2013  . Hypertensive heart disease 06/22/2013  . Disease of skin and subcutaneous tissue 12/25/2012  . Disease of nail 11/25/2012  . Encounter for screening for eye and ear disorders 07/29/2012  . Abnormal blood chemistry 12/10/2011  . 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 Nondependent alcohol abuse 06/05/2011  . Chronic nonalcoholic liver disease 02/54/2706  . Thrombocytopenia ([place]) 07/12/2010  . ABNORMAL ABDOMINAL ULTRASOUND 09/14/2008  . HLD (hyperlipidemia) 09/08/2007  . ALCOHOL ABUSE 02/25/2007  . TOBACCO ABUSE 02/25/2007  . INSOMNIA 02/25/2007  . DEPENDENCE, ALCOHOL NEC/NOS, IN REMISSION 02/19/2007  . HYPERTENSION 02/19/2007  . COPD 02/19/2007    Past Surgical History:  Procedure Laterality Date  . COLONOSCOPY    . FLEXIBLE BRONCHOSCOPY [name] 03/21/2018   Procedure: FLEXIBLE BRONCHOSCOPY;  Surgeon: Marshell Garfinkel, MD;  Location: [facility];  Service: Pulmonary;  Laterality: [name];  . NOSE SURGERY  90's  . VIDEO BRONCHOSCOPY WITH ENDOBRONCHIAL ULTRASOUND [name] 03/21/2018   Procedure: VIDEO BRONCHOSCOPY WITH ENDOBRONCHIAL ULTRASOUND;  Surgeon: Marshell Garfinkel, MD;  Location: [facility];  Service: Pulmonary;  Laterality: [name];        Home Medications    Prior to Admission medications   Medication Sig Start Date End Date Taking? Authorizing Provider  aspirin 81 MG tablet Take 81 mg by mouth daily.   Yes [provider]  atorvastatin (LIPITOR) 40  MG tablet Take 40 mg by mouth daily.  10/02/13  Yes [provider]  benazepril (LOTENSIN) 20 MG tablet Take 20 mg by mouth 2 (two) times daily.  05/29/18  Yes [provider]  folic acid (FOLVITE) 263 MCG tablet Take 400 mcg by mouth daily.   Yes [provider]  HYDROcodone-acetaminophen (NORCO) 5-325 MG tablet 1/2 to 1 tablet 4 times daily as needed for pain. 08/30/18  Yes Brunetta Genera, MD  ibuprofen (ADVIL,MOTRIN) 200 MG tablet Take 400 mg by mouth every 6 (six) hours as needed for headache, mild pain or moderate pain.   Yes [provider]  metFORMIN (GLUCOPHAGE) 500 MG tablet Take 500 mg by mouth every morning.   Yes [provider]  prochlorperazine (COMPAZINE) 10 MG tablet TAKE 1 TABLET BY MOUTH EVERY 6 HOURS AS NEEDED FOR NAUSEA AND VOMITING Patient taking differently: Take 10 mg by mouth every 6 (six) hours as needed for nausea or vomiting.  07/31/18  Yes Brunetta Genera, MD  doxycycline (VIBRAMYCIN) 100 MG capsule Take 1 capsule (100 mg total) by mouth 2 (two) times daily. Patient not taking: Reported on 09/14/2018 08/29/18   Fransico Meadow, PA-C  lidocaine (XYLOCAINE) 2 % solution Use as directed 15 mLs in the mouth or throat every 4 (four) hours as needed (esophagitis). Swish and swallow Patient not taking: Reported on 08/22/2018 05/08/18   Hayden Pedro, PA-C  predniSONE (DELTASONE) 10 MG tablet Take 4 tablets (40 mg total) by mouth daily with breakfast for 5 days. 09/14/18 09/19/18  Rodell Perna A, PA-C  sucralfate (CARAFATE) 1 g tablet Take 1 tablet (1 g total) by mouth 4 (four) times daily -  with meals and at bedtime. 5 min before meals for radiation induced esophagitis Patient not taking: Reported on 08/22/2018 05/02/18   Tyler Pita, MD    Family History Family History  Problem Relation Age of Onset  . Arthritis Mother   . Cancer Father   . Cancer Sister   . Heart disease Maternal Uncle   . Colon cancer Neg  Hx     Social History Social History   Tobacco Use  . Smoking status: Former Smoker    Packs/day: 0.25    Years: 50.00    Pack years: 12.50    Types: Cigarettes    Last attempt to quit: 07/2017    Years since quitting: 1.2  . Smokeless tobacco: Never Used  . Tobacco comment: quit smoking 07/2017  Substance Use Topics  . Alcohol use: Yes    Alcohol/week: 14.0 - 21.0 standard drinks    Types: 14 - 21 Cans of beer per week  . Drug use: No     Allergies   Neomycin-bacitracin zn-polymyx   Review of Systems Review of Systems  Constitutional: Positive for fatigue. Negative for fever.  Respiratory: Positive for shortness of breath.   Cardiovascular: Positive for leg swelling. Negative for chest pain.  Gastrointestinal: Negative for abdominal pain, nausea and vomiting.  Genitourinary: Positive for decreased urine volume. Negative for dysuria and hematuria.  Neurological: Negative for syncope, weakness and numbness.  Psychiatric/Behavioral: Positive for  confusion.  All other systems reviewed and are negative.    Physical Exam Updated Vital Signs BP (!) 166/103 (BP Location: Left Arm)   Pulse 91   Temp (!) 97.5 F (36.4 C) (Oral)   Resp 19   SpO2 94%   Physical Exam Vitals signs and nursing note reviewed.  Constitutional:      General: He is not in acute distress.    Appearance: He is well-developed.  HENT:     Head: Normocephalic and atraumatic.  Eyes:     General:        Right eye: No discharge.        Left eye: No discharge.     Conjunctiva/sclera: Conjunctivae normal.  Neck:     Vascular: No JVD.     Trachea: No tracheal deviation.  Cardiovascular:     Rate and Rhythm: Normal rate and regular rhythm.     Comments: 3+ pitting edema of the right lower extremity, 2+ pitting edema of the left lower extremity, Homans sign absent bilaterally.  2+ radial and DP/PT pulses bilaterally.  Compartments are soft, no palpable cords Pulmonary:     Effort: Pulmonary  effort is normal.     Breath sounds: Wheezing present.     Comments: Diffuse expiratory wheezes, scattered crackles, worse on the right Chest:     Chest wall: No tenderness.  Abdominal:     General: Abdomen is protuberant. Bowel sounds are normal. There is no distension.     Palpations: Abdomen is soft.     Tenderness: There is no abdominal tenderness. There is no right CVA tenderness, left CVA tenderness, guarding or rebound.     Hernia: A hernia is present. Hernia is present in the umbilical area.     Comments: Chronic umbilical hernia, easily reduced.  Abdomen nontender.  Musculoskeletal:     Right lower leg: Edema present.     Left lower leg: Edema present.  Skin:    General: Skin is warm and dry.     Findings: No erythema.  Neurological:     Mental Status: He is alert and oriented to person, place, and time. He is confused.     GCS: GCS eye subscore is 4. GCS verbal subscore is 5. GCS motor subscore is 6.     Cranial Nerves: No cranial nerve deficit.     Sensory: Sensation is intact.     Motor: Motor function is intact. No pronator drift.     Comments: Fluent speech, no facial droop, sensation intact globally.  Follows commands without difficulty but appears confused and has difficulty providing history.  Psychiatric:        Behavior: Behavior normal.      ED Treatments / Results  Labs (all labs ordered are listed, but only abnormal results are displayed) Labs Reviewed  BASIC METABOLIC PANEL - Abnormal; Notable for the following components:      Result Value   Glucose, Bld 119 (*)    Calcium 8.8 (*)    All other components within normal limits  CBC WITH DIFFERENTIAL/PLATELET - Abnormal; Notable for the following components:   RBC 3.99 (*)    Hemoglobin 11.4 (*)    HCT 36.8 (*)    Platelets 138 (*)    All other components within normal limits  HEPATIC FUNCTION PANEL - Abnormal; Notable for the following components:   Total Bilirubin 0.2 (*)    Indirect Bilirubin 0.1  (*)    All other components within normal limits  BLOOD  GAS, ARTERIAL - Abnormal; Notable for the following components:   pO2, Arterial 110 (*)    All other components within normal limits  AMMONIA  URINALYSIS, ROUTINE W REFLEX MICROSCOPIC    EKG EKG Interpretation  Date/Time:  Sunday September 14 2018 19:00:38 EST Ventricular Rate:  99 PR Interval:    QRS Duration: 88 QT Interval:  381 QTC Calculation: 489 R Axis:   -6 Text Interpretation:  Sinus rhythm Low voltage, precordial leads Abnormal R-wave progression, early transition ST elevation, consider inferior injury Borderline prolonged QT interval No significant change since last tracing Confirmed by Dorie Rank [phone]) on 09/14/2018 7:05:10 PM   Radiology Dg Chest 2 View  Result Date: 09/14/2018 CLINICAL DATA:  72 year old male with shortness of breath and weakness. History of treated lung cancer. EXAM: CHEST - 2 VIEW COMPARISON:  Chest radiographs 08/29/2018, PET-CT 06/23/2018, and earlier. FINDINGS: Volume loss and architectural distortion in the right lung redemonstrated with superimposed right pleural effusion which appears moderate and mildly increased since November. This effusion was small in September. Stable rightward shift of the mediastinum. Stable mediastinal contour. Visualized tracheal air column is within normal limits. The left lung is stable and clear. Stable visualized osseous structures. Chronic mild compression fracture at the thoracolumbar junction. Paucity of bowel gas in the upper abdomen. IMPRESSION: 1. Increased right pleural effusion since November, now moderate, and superimposed on post treatment right lung architectural distortion. 2. No new cardiopulmonary abnormality identified. Electronically Signed   By: Genevie Ann M.D.   On: 09/14/2018 19:14   Ct Head Wo Contrast  Result Date: 09/14/2018 CLINICAL DATA:  Disoriented, altered level of consciousness. EXAM: CT HEAD WITHOUT CONTRAST TECHNIQUE: Contiguous axial  images were obtained from the base of the skull through the vertex without intravenous contrast. COMPARISON:  None. FINDINGS: Brain: Diffuse atrophy. No acute intracranial abnormality. Specifically, no hemorrhage, hydrocephalus, mass lesion, acute infarction, or significant intracranial injury. Vascular: No hyperdense vessel or unexpected calcification. Skull: No acute calvarial abnormality. Sinuses/Orbits: No acute finding Other: None IMPRESSION: Cerebral atrophy.  No acute intracranial abnormality. Electronically Signed   By: Rolm Baptise M.D.   On: 09/14/2018 20:29    Procedures Procedures (including critical care time)  Medications Ordered in ED Medications  ipratropium-albuterol (DUONEB) 0.5-2.5 (3) MG/3ML nebulizer solution 3 mL (3 mLs Nebulization Given 09/14/18 1957)  albuterol (PROVENTIL HFA;VENTOLIN HFA) 108 (90 Base) MCG/ACT inhaler 1 puff (1 puff Inhalation Provided for home use 09/14/18 2222)     Initial Impression / Assessment and Plan / ED Course  I have reviewed the triage vital signs and the nursing notes.  Pertinent labs & imaging results that were available during my care of the patient were reviewed by me and considered in my medical decision making (see chart for details).     Patient presenting with complaint of shortness of breath and confusion.  He is afebrile, somewhat hypertensive in the ED.  He is nontoxic in appearance.  He was placed on 2 L via nasal cannula for comfort with some improvement, but exhibited no tachypnea and was speaking in full sentences without difficulty.  Lab work reviewed by me shows no leukocytosis, mild anemia, no metabolic derangements.  ABG performed shows no CO2 retention, ammonia within normal limits and no evidence of hepatic encephalopathy.  UA without any evidence of UTI or nephrolithiasis.  Head CT shows no acute intracranial abnormality with no evidence of mass or CVA.  On examination patient is diffusely wheezy.  His chest x-ray shows  increased  right pleural effusion, no evidence of pneumonia.  EKG shows no significant changes from last tracing and he has no complaint of chest pain and I doubt ACS/MI or PE.  He was given a breathing treatment with improvement.  He was ambulated on pulse oximeter with stable SPO2 saturations.  He states his shortness of breath has improved somewhat.  I see no source for his confusion.  I doubt CVA with nonfocal neuro exam.  He has close follow-up with his oncologist.  He appears stable for discharge at this time.  We will give him an albuterol inhaler today, and short course of prednisone.  Advised that the prednisone can cause hyperglycemia and that he should monitor his blood sugars at home.  Discuss strict ED return precautions.  Patient and significant other verbalized understanding of and agreement with plan and patient is stable for discharge at this time.  Patient was seen and evaluated Dr. Tomi Bamberger who agrees with assessment and plan at this time.  Final Clinical Impressions(s) / ED Diagnoses   Final diagnoses:  Shortness of breath  Confusion    ED Discharge Orders         Ordered    predniSONE (DELTASONE) 10 MG tablet  Daily with breakfast     09/14/18 2216           Renita Papa, PA-C 09/15/18 0112    Dorie Rank, MD 09/15/18 [phone]

## 2018-09-14 NOTE — Discharge Instructions (Signed)
Your chest x-ray today showed increased fluid on the right side of the lungs.  Use the albuterol inhaler 1 to 2 puffs every 4-6 hours as needed for shortness of breath.  Take the prednisone with food as prescribed.  Check your blood sugars to make sure that your blood sugars do not increase while you are taking the prednisone.  Do not take ibuprofen, Advil, Motrin, or Aleve while taking prednisone.  Follow-up with your oncologist or primary care physician for reevaluation of your symptoms.  Return to emergency department immediately for any concerning signs or symptoms develop such as worsening pain/shortness of breath/chest pain, persistent confusion, weakness, persistent vomiting.

## 2018-09-14 NOTE — ED Notes (Signed)
Pt in chest xray

## 2018-09-15 ENCOUNTER — Telehealth: Payer: Self-pay | Admitting: *Deleted

## 2018-09-15 NOTE — Telephone Encounter (Signed)
Daughter called - left VM: requested refill of pain med. Norco last refilled 08/30/18 - #60 to be taken 1/2 to 1 tablet 4 times daily for pain. Next MD appt 12/20.  Daughter also wanted to update that patient was in ED last night.

## 2018-09-16 ENCOUNTER — Telehealth: Payer: Self-pay | Admitting: *Deleted

## 2018-09-16 ENCOUNTER — Other Ambulatory Visit: Payer: Self-pay | Admitting: Hematology

## 2018-09-16 MED ORDER — HYDROCODONE-ACETAMINOPHEN 5-325 MG PO TABS: ORAL_TABLET | ORAL | 0 refills | Status: DC

## 2018-09-16 NOTE — Telephone Encounter (Signed)
Barry Booker (spouse) called to request refill of pain medicine. Per spouse, patient does not like to talk on phone, so she is asking him questions. She states patient has pain on right side.  Asked how often patient is taking pain med. RX given for #60 on 11/30. She states she gives it to patient when he asks for it, usually 4 times a day. Spouse states there are 3-4 pills left.  Also wants something for patient's anxiety and to calm him down. "He is going in and out of the house every 2 minutes". Tells his wife it is stuffy in the house. Does not complain of shortness of breath. No cough.   Went to Ed on Sunday night. Had CXR and a CT of his head. Per wife, they found some fluid on his rt lung. Patient was given RX for prednisone Sunday night, started taking it today. Also prescribed inhaler, which wife says is helping.

## 2018-09-18 ENCOUNTER — Ambulatory Visit (HOSPITAL_COMMUNITY)
Admission: RE | Admit: 2018-09-18 | Discharge: 2018-09-18 | Disposition: A | Discharge status: Home / Self Care | Payer: Medicare HMO | Source: Ambulatory Visit | Attending: Hematology | Admitting: Hematology

## 2018-09-18 DIAGNOSIS — C3491 Malignant neoplasm of unspecified part of right bronchus or lung: Secondary | ICD-10-CM | POA: Insufficient documentation

## 2018-09-18 MED ORDER — IOHEXOL 300 MG/ML  SOLN
75.0000 mL | Freq: Once | INTRAMUSCULAR | Status: AC | PRN
  Administered 2018-09-18: 75 mL via INTRAVENOUS

## 2018-09-18 MED ORDER — SODIUM CHLORIDE (PF) 0.9 % IJ SOLN
INTRAMUSCULAR | Status: AC
  Filled 2018-09-18: qty 50

## 2018-09-18 NOTE — Progress Notes (Signed)
[name]    HEMATOLOGY/ONCOLOGY CLINIC NOTE  Date of Service: 09/19/2018  Patient Care Team: Bernerd Limbo, MD as PCP - General (Family Medicine)  CHIEF COMPLAINTS/PURPOSE OF CONSULTATION:  F/u for mx of lung cancer  HISTORY OF PRESENTING ILLNESS:   Barry Booker is a wonderful 72 y.o. male who has been referred to [place] by Dr Marshell Garfinkel for evaluation and management of presumed newly diagnosed rt sided lung cancer with concern for bronchial obstruction.  Patient has a history of hypertension, dyslipidemia, diabetes, COPD, alcohol abuse and previously had a chest x-ray on 02/20/2018 at [facility] which showed Right upper lobe consolidation/atelectasis may relate to post obstructive pneumonitis/pneumonia. An underlying obstructing lesion should be considered. Consider CT for further assessment.  H subsequently then had a CT of the chest on 02/27/2018 which showed -5.8 x 4.9 cm mass in the right mediastinum and hilum with associated severe narrowing of the right main pulmonary artery as well as right upper lobe and right middle lobe arteries. Obstructed right main stem bronchus and collapse of the right upper lobe and right middle lobe. Small right effusion. The appearance is similar to the patient's chest radiograph from 02/20/2018.  Patient subsequently had a PET CT on 03/10/2018 by Dr. Vaughan Browner which showed Large intensely hypermetabolic RIGHT hilar mass obstructs the RIGHT upper lobe bronchus and surrounds the bronchus intermedius. Post obstructive collapse of the RIGHT upper lobe.  No hypermetabolic mediastinal or supraclavicular lymph nodes. No evidence distant metastatic disease.  Patient had a flexible video fiberoptic bronchoscopy done on 03/21/2018 by Dr. Vaughan Browner which showed which showed some nodularity of the right mainstem bronchial mucosa with near complete occlusion of right main stem. Unable to traverse the occluded part of right main stem. The mucosa was friable with propensity to bleed.  Endobronchial biopsies and brushings were taken from the R mainstem and right upper lobe for pathology.  Preliminary results showed atypical cells however final cytology/pathology is currently pending.  Patient report rt sided chest pain with deep inspiration, anorexia and weight loss of about 30lbs in the last 6 months.  Interval History:   Barry Booker returns to clinic today for management and evaluation of his lung cancer, and C5 Durvalumab. The patient's last visit with [place] was on 08/22/18. He is accompanied today by his partner. The pt reports that he is doing well overall.   In the interim the pt presented to the ED on 09/14/18 for SOB and subsequently had a CT Chest yesterday, 09/18/18, as noted below.   The pt reports that he felt nervous about not being able to catch his breath. He received a nebulizer treatment and an inhaler at the ED, and his partner endorses that the pt was not eating well prior to this, and was appearing slightly confused. The pt also had a CT Head in the ED which revealed cerebral atrophy and no acute intracranial abnormality. He notes that once he presented to the ED he was already feeling better, but he also notes that his Albuterol inhaler is very helpful in relieving his symptoms with immediacy, uses this 3-4 times a day. He notes that his appetite has returned since leaving the ED, and he has been eating bette.r   The pt notes that he has not smoked cigarettes in several days.   The pt notes that he has intermittent pain in the right side of his chest which lasts "not long at all." He denies any fevers, chills, night sweats. Endorses that his breathing is "  a lot better now," but he finds that he needs to go outside of his home to breathe better intermittently.   The pt denies coughing up phlegm and denies having a cough. He denies any difficulty swallowing.   He was also given a 5 day course of Prednisone at the ED.   Of note prior to today's visit, the  patient had a CT Chest on 09/18/18 which revealed Rarely relatively stable perihilar consolidation on the RIGHT consistent post treatment change. Persistent narrowing of the proximal bronchi. 2. Interval increase in interstitial thickening in the RIGHT upper lobe. Differential include postobstructive process versus lymphangitic carcinoma. Favor postobstructive process. 3. Interval increase in RIGHT pleural effusion. 4. With potential progressive malignant findings versus progressive benign change, consider follow-up FDG PET scan.   Lab results today (09/19/18) of CBC w/diff and CMP is as follows: all values are WNL except for WBC at 11.8k, HGB at 12.5, ANC at 10.7k, Lymphs abs at 600, Glucose at 189.  On review of systems, pt reports improved breathing, intermittent right chest pain, eating well, strong appetite, and denies cough, phlegm, fevers, chills, night sweats, and any other symptoms.   MEDICAL HISTORY:  Past Medical History:  Diagnosis Date  . Alcohol abuse   . COPD (chronic obstructive pulmonary disease) ([place])   . Diabetes mellitus without complication ([place])   . Gout   . High cholesterol   . Hypertension   . Malignant neoplasm of right upper lobe of lung ([place]) 03/29/2018    SURGICAL HISTORY: Past Surgical History:  Procedure Laterality Date  . COLONOSCOPY    . FLEXIBLE BRONCHOSCOPY [name] 03/21/2018   Procedure: FLEXIBLE BRONCHOSCOPY;  Surgeon: Marshell Garfinkel, MD;  Location: [place];  Service: Pulmonary;  Laterality: [name];  . NOSE SURGERY  90's  . VIDEO BRONCHOSCOPY WITH ENDOBRONCHIAL ULTRASOUND [name] 03/21/2018   Procedure: VIDEO BRONCHOSCOPY WITH ENDOBRONCHIAL ULTRASOUND;  Surgeon: Marshell Garfinkel, MD;  Location: [place];  Service: Pulmonary;  Laterality: [name];    SOCIAL HISTORY: Social History   Socioeconomic History  . Marital status: Married    Spouse name: Not on file  . Number of children: Not on file  . Years of education: Not on file  . Highest education level: Not on file    Occupational History  . Not on file  Social Needs  . Financial resource strain: Not on file  . Food insecurity:    Worry: Not on file    Inability: Not on file  . Transportation needs:    Medical: Not on file    Non-medical: Not on file  Tobacco Use  . Smoking status: Former Smoker    Packs/day: 0.25    Years: 50.00    Pack years: 12.50    Types: Cigarettes    Last attempt to quit: 07/2017    Years since quitting: 1.2  . Smokeless tobacco: Never Used  . Tobacco comment: quit smoking 07/2017  Substance and Sexual Activity  . Alcohol use: Yes    Alcohol/week: 14.0 - 21.0 standard drinks    Types: 14 - 21 Cans of beer per week  . Drug use: No  . Sexual activity: Not on file  Lifestyle  . Physical activity:    Days per week: Not on file    Minutes per session: Not on file  . Stress: Not on file  Relationships  . Social connections:    Talks on phone: Not on file    Gets together: Not on file    Attends  religious service: Not on file    Active member of club or organization: Not on file    Attends meetings of clubs or organizations: Not on file    Relationship status: Not on file  . Intimate partner violence:    Fear of current or ex partner: Not on file    Emotionally abused: Not on file    Physically abused: Not on file    Forced sexual activity: Not on file  Other Topics Concern  . Not on file  Social History Narrative  . Not on file    FAMILY HISTORY: Family History  Problem Relation Age of Onset  . Arthritis Mother   . Cancer Father   . Cancer Sister   . Heart disease Maternal Uncle   . Colon cancer Neg Hx     ALLERGIES:  is allergic to neomycin-bacitracin zn-polymyx.  MEDICATIONS:  Current Outpatient Medications  Medication Sig Dispense Refill  . aspirin 81 MG tablet Take 81 mg by mouth daily.    [name] atorvastatin (LIPITOR) 40 MG tablet Take 40 mg by mouth daily.     . benazepril (LOTENSIN) 20 MG tablet Take 20 mg by mouth 2 (two) times daily.     .  folic acid (FOLVITE) 924 MCG tablet Take 400 mcg by mouth daily.    [name] HYDROcodone-acetaminophen (NORCO) 5-325 MG tablet 1/2 to 1 tablet 4 times daily as needed for pain. 60 tablet 0  . ibuprofen (ADVIL,MOTRIN) 200 MG tablet Take 400 mg by mouth every 6 (six) hours as needed for headache, mild pain or moderate pain.    . predniSONE (DELTASONE) 10 MG tablet Take 4 tablets (40 mg total) by mouth daily with breakfast for 5 days. 20 tablet 0  . albuterol (PROVENTIL HFA;VENTOLIN HFA) 108 (90 Base) MCG/ACT inhaler Inhale 1-2 puffs into the lungs every 4 (four) hours as needed for wheezing or shortness of breath. 1 Inhaler 2  . amoxicillin-clavulanate (AUGMENTIN) 875-125 MG tablet Take 1 tablet by mouth 2 (two) times daily. 20 tablet 0  . lidocaine (XYLOCAINE) 2 % solution Use as directed 15 mLs in the mouth or throat every 4 (four) hours as needed (esophagitis). Swish and swallow (Patient not taking: Reported on 08/22/2018) 100 mL 1  . metFORMIN (GLUCOPHAGE) 500 MG tablet Take 500 mg by mouth every morning.    . prochlorperazine (COMPAZINE) 10 MG tablet TAKE 1 TABLET BY MOUTH EVERY 6 HOURS AS NEEDED FOR NAUSEA AND VOMITING (Patient taking differently: Take 10 mg by mouth every 6 (six) hours as needed for nausea or vomiting. ) 30 tablet 1  . sucralfate (CARAFATE) 1 g tablet Take 1 tablet (1 g total) by mouth 4 (four) times daily -  with meals and at bedtime. 5 min before meals for radiation induced esophagitis (Patient not taking: Reported on 08/22/2018) 120 tablet 2   No current facility-administered medications for this visit.     REVIEW OF SYSTEMS:    A 10+ POINT REVIEW OF SYSTEMS WAS OBTAINED including neurology, dermatology, psychiatry, cardiac, respiratory, lymph, extremities, GI, GU, Musculoskeletal, constitutional, breasts, reproductive, HEENT.  All pertinent positives are noted in the HPI.  All others are negative.   PHYSICAL EXAMINATION: ECOG PERFORMANCE STATUS: 1 - Symptomatic but completely  ambulatory  Vitals:   09/19/18 0852  BP: (!) 162/103  Pulse: (!) 106  Resp: 18  Temp: 97.8 F (36.6 C)  SpO2: 96%   Filed Weights   09/19/18 0852  Weight: 227 lb (103 kg)   .Body  mass index is 32.57 kg/m.  GENERAL:alert, in no acute distress and comfortable SKIN: no acute rashes, no significant lesions EYES: conjunctiva are pink and non-injected, sclera anicteric OROPHARYNX: MMM, no exudates, no oropharyngeal erythema or ulceration NECK: supple, no JVD LYMPH:  no palpable lymphadenopathy in the cervical, axillary or inguinal regions LUNGS: decreased air entry in the right base HEART: regular rate & rhythm ABDOMEN:  normoactive bowel sounds , non tender, not distended. No palpable hepatosplenomegaly.  Extremity: no pedal edema PSYCH: alert & oriented x 3 with fluent speech NEURO: no focal motor/sensory deficits   LABORATORY DATA:  I have reviewed the data as listed . CBC Latest Ref Rng & Units 09/19/2018 09/14/2018 09/05/2018  WBC 4.0 - 10.5 K/uL 11.8(H) 8.4 7.6  Hemoglobin 13.0 - 17.0 g/dL 12.5(L) 11.4(L) 12.4(L)  Hematocrit 39.0 - 52.0 % 39.1 36.8(L) 38.6(L)  Platelets 150 - 400 K/uL 159 138(L) 158    . CMP Latest Ref Rng & Units 09/19/2018 09/14/2018 09/05/2018  Glucose 70 - 99 mg/dL 189(H) 119(H) 106(H)  BUN 8 - 23 mg/dL _0 Creatinine 0.61 - 1.24 mg/dL 0.92 0.95 0.92  Sodium 135 - 145 mmol/L 141 143 142  Potassium 3.5 - 5.1 mmol/L 3.8 3.6 4.0  Chloride 98 - 111 mmol/L 106 106 106  CO2 22 - 32 mmol/L _1 Calcium 8.9 - 10.3 mg/dL 9.1 8.8(L) 9.5  Total Protein 6.5 - 8.1 g/dL 7.6 7.2 7.4  Total Bilirubin 0.3 - 1.2 mg/dL 0.3 0.2(L) 0.3  Alkaline Phos 38 - 126 U/L 57 46 69  AST 15 - 41 U/L 38 19 14(L)  ALT 0 - 44 U/L _2 03/21/18   03/25/18 Endobronchial bx:     RADIOGRAPHIC STUDIES: I have personally reviewed the radiological images as listed and agreed with the findings in the report. Dg Chest 2 View  Result Date:  09/14/2018 CLINICAL DATA:  72 year old male with shortness of breath and weakness. History of treated lung cancer. EXAM: CHEST - 2 VIEW COMPARISON:  Chest radiographs 08/29/2018, PET-CT 06/23/2018, and earlier. FINDINGS: Volume loss and architectural distortion in the right lung redemonstrated with superimposed right pleural effusion which appears moderate and mildly increased since November. This effusion was small in September. Stable rightward shift of the mediastinum. Stable mediastinal contour. Visualized tracheal air column is within normal limits. The left lung is stable and clear. Stable visualized osseous structures. Chronic mild compression fracture at the thoracolumbar junction. Paucity of bowel gas in the upper abdomen. IMPRESSION: 1. Increased right pleural effusion since November, now moderate, and superimposed on post treatment right lung architectural distortion. 2. No new cardiopulmonary abnormality identified. Electronically Signed   By: Genevie Ann M.D.   On: 09/14/2018 19:14   Dg Chest 2 View  Result Date: 08/29/2018 CLINICAL DATA:  Shortness of breath. EXAM: CHEST - 2 VIEW COMPARISON:  Single-view of the chest 03/22/2018. PET CT scan 06/23/2018. FINDINGS: Small, chronic right pleural effusion and volume loss in the right chest are seen. The patient's known right upper lobe mass lesion is difficult to visualize on this examination. The left lung is clear. Heart size is normal. No pneumothorax or pleural effusion. IMPRESSION: Volume loss in the right chest is presumably related to treatment for lung carcinoma. Small, chronic right pleural effusion. Electronically Signed   By: Inge Rise M.D.   On: 08/29/2018 19:59   Ct Head Wo Contrast  Result Date: 09/14/2018 CLINICAL DATA:  Disoriented, altered level of  consciousness. EXAM: CT HEAD WITHOUT CONTRAST TECHNIQUE: Contiguous axial images were obtained from the base of the skull through the vertex without intravenous contrast. COMPARISON:   None. FINDINGS: Brain: Diffuse atrophy. No acute intracranial abnormality. Specifically, no hemorrhage, hydrocephalus, mass lesion, acute infarction, or significant intracranial injury. Vascular: No hyperdense vessel or unexpected calcification. Skull: No acute calvarial abnormality. Sinuses/Orbits: No acute finding Other: None IMPRESSION: Cerebral atrophy.  No acute intracranial abnormality. Electronically Signed   By: Rolm Baptise M.D.   On: 09/14/2018 20:29   Ct Chest W Contrast  Result Date: 09/18/2018 CLINICAL DATA:  Non-small cell lung cancer, adenocarcinoma, metastatic, post first-line therapy, assess tx response Stage III lung cancer on maintainence Immunotherapy for assessment of treatment response EXAM: CT CHEST WITH CONTRAST TECHNIQUE: Multidetector CT imaging of the chest was performed during intravenous contrast administration. CONTRAST:  60m OMNIPAQUE IOHEXOL 300 MG/ML  SOLN COMPARISON:  PET-CT 06/23/2018 FINDINGS: Cardiovascular: There is narrowing of the distal aspect of the RIGHT main pulmonary artery through the RIGHT hilum. Difficult to compare to comparison exams as they were noncontrast exams however favor similar findings. Coronary artery calcification and aortic atherosclerotic calcification. Mediastinum/Nodes: No axillary supraclavicular adenopathy. No mediastinal adenopathy. Lungs/Pleura: There is fullness about the RIGHT hilum again noted. There is masslike consolidation through this region which is similar to prior. There is severe narrowing of the RIGHT upper lobe and RIGHT middle lobe bronchi as well as the RIGHT lower lobe bronchus. There is perihilar linear thickening which appears slightly increased. Increase in pleural effusion. Upper Abdomen: Limited view of the liver, kidneys, pancreas are unremarkable. Normal adrenal glands. Musculoskeletal: No aggressive osseous lesion. IMPRESSION: 1. Rarely relatively stable perihilar consolidation on the RIGHT consistent post treatment  change. Persistent narrowing of the proximal bronchi. 2. Interval increase in interstitial thickening in the RIGHT upper lobe. Differential include postobstructive process versus lymphangitic carcinoma. Favor postobstructive process. 3. Interval increase in RIGHT pleural effusion. 4. With potential progressive malignant findings versus progressive benign change, consider follow-up FDG PET scan. Electronically Signed   By: SSuzy BouchardM.D.   On: 09/18/2018 14:54    ASSESSMENT & PLAN:   72y.o.  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 Post obstructive collapse of the Right upper lobe. No hypermetabolic mediastinal or supraclavicular lymph nodes. No evidence of distant mets.  06/23/18 PET/CT revealed  Marked reduction in activity associated with the dominant right upper lobe central mass, maximum SUV 3.4 and previously 15.3. Please note that this lesion does still in case and markedly narrows the right upper lobe bronchus. 2. There is some patchy ground-glass opacities with small airspace opacity components in the right upper lobe, right middle lobe, right lower lobe, and lingula. Some of these are hypermetabolic, for example lingular opacity has a maximum SUV of 6.2 and the right lower lobe bandlike opacity 5.7. These are probably inflammatory and merit surveillance. 3. Small to moderate right pleural effusion with accentuated metabolic activity. Malignant effusion not excluded. 4. No findings of metastatic disease to the neck, abdomen/pelvis, or skeleton. 5. Aortic aneurysm NOS. Infrarenal aortic aneurysm 3.8 cm in  diameter. Recommend followup by [facility] 2 years. 6. Other imaging findings of potential  clinical significance: Chronic paranasal sinusitis. Aortic Atherosclerosis. Coronary atherosclerosis. Hepatic cirrhosis. Ventral hernia containing a loop of small bowel. Suspected late phase healing of the right anterior eighth rib.   2) s/p RUL airway obstruction with RUL lung collapse. 3) Concern for severe narrowing of the right main pulmonary artery as well as right upper lobe and right middle lobe arteries.   PLAN:  -Discussed pt labwork today, 09/19/18; HGB continues to improve, now to 12.5, ANC at 10.7k, chemistries are stable  -Discussed the 09/18/18 CT Chest which revealed Rarely relatively stable perihilar consolidation on the RIGHT consistent post treatment change. Persistent narrowing of the proximal bronchi. 2. Interval increase in interstitial thickening in the RIGHT upper lobe. Differential include postobstructive process versus lymphangitic carcinoma. Favor postobstructive process. 3. Interval increase in RIGHT pleural effusion. 4. With potential progressive malignant findings versus progressive benign change, consider follow-up FDG PET scan. -Post radiation pneumonitis vs immunotherapy triggered inflammation vs atypical infection felt less likely  -Benign vs pathologic effusion ? -Will refer pt to IR for Paracentesis -If fluid is malignant, will likely change course with intended treatment  -Empiric Augmentin for possible pneumonia -Recommend holding Durvalumab as scheduled today to rule out possibility of immunotherapy caused inflammation -Finish Prednisone course -Refilled Albuterol today  -Advised that the pt follow up with his PCP for management of his BP  -TSH WNL on 07/01/2018 --will monitor on PD1 inhibitor therapy -Current plan to complete maintenance Durvalumab for up to one year -Again counseled the pt towards complete smoking cessation -Recommend Senna S for occasional constipation   -Will see the pt back on 10/03/18   US guided diagnostic and therapeutic Thoracentesis on 10/02/2018 RTC with Dr Irene Limbo with next treatment with labs on 10/03/2018   All of the patients questions were answered with apparent satisfaction. The patient knows to call the clinic with any problems, questions or concerns.  The total time spent in the appt was 30 minutes and more than 50% was on counseling and direct patient cares.     Sullivan Lone MD Jansen AAHIVMS [facility] Hematology/Oncology Physician [facility]  (Office):       [phone] (Work cell):  [phone] (Fax):           [phone]  I, Baldwin Jamaica, am acting as a scribe for Dr. Sullivan Lone.   .I have reviewed the above documentation for accuracy and completeness, and I agree with the above. Brunetta Genera MD

## 2018-09-19 ENCOUNTER — Inpatient Hospital Stay: Payer: Medicare HMO

## 2018-09-19 ENCOUNTER — Inpatient Hospital Stay (HOSPITAL_BASED_OUTPATIENT_CLINIC_OR_DEPARTMENT_OTHER): Payer: Medicare HMO | Admitting: Hematology

## 2018-09-19 VITALS — BP 162/103 | HR 106 | Temp 97.8°F | Resp 18 | Ht 70.0 in | Wt 227.0 lb

## 2018-09-19 DIAGNOSIS — C3491 Malignant neoplasm of unspecified part of right bronchus or lung: Secondary | ICD-10-CM

## 2018-09-19 DIAGNOSIS — Z5112 Encounter for antineoplastic immunotherapy: Secondary | ICD-10-CM | POA: Diagnosis not present

## 2018-09-19 DIAGNOSIS — C3401 Malignant neoplasm of right main bronchus: Secondary | ICD-10-CM | POA: Diagnosis not present

## 2018-09-19 DIAGNOSIS — J189 Pneumonia, unspecified organism: Secondary | ICD-10-CM

## 2018-09-19 DIAGNOSIS — J90 Pleural effusion, not elsewhere classified: Secondary | ICD-10-CM

## 2018-09-19 LAB — CBC WITH DIFFERENTIAL/PLATELET
Abs Immature Granulocytes: 0.07 10*3/uL (ref 0.00–0.07)
Basophils Absolute: 0 10*3/uL (ref 0.0–0.1)
Basophils Relative: 0 %
Eosinophils Absolute: 0 10*3/uL (ref 0.0–0.5)
Eosinophils Relative: 0 %
HCT: 39.1 % (ref 39.0–52.0)
HEMOGLOBIN: 12.5 g/dL — AB (ref 13.0–17.0)
Immature Granulocytes: 1 %
LYMPHS PCT: 5 %
Lymphs Abs: 0.6 10*3/uL — ABNORMAL LOW (ref 0.7–4.0)
MCH: 28.7 pg (ref 26.0–34.0)
MCHC: 32 g/dL (ref 30.0–36.0)
MCV: 89.9 fL (ref 80.0–100.0)
Monocytes Absolute: 0.4 10*3/uL (ref 0.1–1.0)
Monocytes Relative: 3 %
Neutro Abs: 10.7 10*3/uL — ABNORMAL HIGH (ref 1.7–7.7)
Neutrophils Relative %: 91 %
Platelets: 159 10*3/uL (ref 150–400)
RBC: 4.35 MIL/uL (ref 4.22–5.81)
RDW: 13.8 % (ref 11.5–15.5)
WBC: 11.8 10*3/uL — ABNORMAL HIGH (ref 4.0–10.5)
nRBC: 0 % (ref 0.0–0.2)

## 2018-09-19 LAB — CMP (CANCER CENTER ONLY)
ALT: 24 U/L (ref 0–44)
AST: 38 U/L (ref 15–41)
Albumin: 3.7 g/dL (ref 3.5–5.0)
Alkaline Phosphatase: 57 U/L (ref 38–126)
Anion gap: 11 (ref 5–15)
BUN: 23 mg/dL (ref 8–23)
CO2: 24 mmol/L (ref 22–32)
Calcium: 9.1 mg/dL (ref 8.9–10.3)
Chloride: 106 mmol/L (ref 98–111)
Creatinine: 0.92 mg/dL (ref 0.61–1.24)
GFR, Est AFR Am: >60 mL/min (ref >60)
GFR, Estimated: >60 mL/min (ref >60)
Glucose, Bld: 189 mg/dL — ABNORMAL HIGH (ref 70–99)
Potassium: 3.8 mmol/L (ref 3.5–5.1)
Sodium: 141 mmol/L (ref 135–145)
Total Bilirubin: 0.3 mg/dL (ref 0.3–1.2)
Total Protein: 7.6 g/dL (ref 6.5–8.1)

## 2018-09-19 MED ORDER — AMOXICILLIN-POT CLAVULANATE 875-125 MG PO TABS: 1.0000 | ORAL_TABLET | Freq: Two times a day (BID) | ORAL | 0 refills | Status: DC

## 2018-09-19 MED ORDER — ALBUTEROL SULFATE HFA 108 (90 BASE) MCG/ACT IN AERS: 1.0000 | INHALATION_SPRAY | RESPIRATORY_TRACT | 2 refills | Status: DC | PRN

## 2018-09-19 NOTE — Patient Instructions (Signed)
Thank you for choosing [facility] to provide your oncology and hematology care.  To afford each patient quality time with our providers, please arrive 30 minutes before your scheduled appointment time.  If you arrive late for your appointment, you may be asked to reschedule.  We strive to give you quality time with our providers, and arriving late affects you and other patients whose appointments are after yours.    If you are a no show for multiple scheduled visits, you may be dismissed from the clinic at the providers discretion.     Again, thank you for choosing [facility], our hope is that these requests will decrease the amount of time that you wait before being seen by our physicians.  ______________________________________________________________________   Should you have questions after your visit to the  Cancer Center, please contact our office at [phone] between the hours of 8:30 and 4:30 p.m.    Voicemails left after 4:30p.m will not be returned until the following business day.     For prescription refill requests, please have your pharmacy contact us directly.  Please also try to allow 48 hours for prescription requests.     Please contact the scheduling department for questions regarding scheduling.  For scheduling of procedures such as PET scans, CT scans, MRI, Ultrasound, etc please contact central scheduling at [phone].     Resources For Cancer Patients and Caregivers:    Oncolink.org:  A wonderful resource for patients and healthcare providers for information regarding your disease, ways to tract your treatment, what to expect, etc.      American Cancer Society:  800-227-2345  Can help patients locate various types of support and financial assistance   Cancer Care: 1-800-813-HOPE (4673) Provides financial assistance, online support groups, medication/co-pay assistance.     [place] DSS:  336-641-3447 Where to apply  for food stamps, Medicaid, and utility assistance   Medicare Rights Center: 800-333-4114 Helps people with Medicare understand their rights and benefits, navigate the Medicare system, and secure the quality healthcare they deserve   SCAT: 336-333-6589 [place] [employer]'s shared-ride transportation service for eligible riders who have a disability that prevents them from riding the fixed route bus.     For additional information on assistance programs please contact our social worker:   Barry Booker:  [phone]  

## 2018-09-22 ENCOUNTER — Other Ambulatory Visit: Payer: Self-pay | Admitting: Hematology

## 2018-09-22 DIAGNOSIS — Z7189 Other specified counseling: Secondary | ICD-10-CM

## 2018-09-22 DIAGNOSIS — C3491 Malignant neoplasm of unspecified part of right bronchus or lung: Secondary | ICD-10-CM

## 2018-09-25 ENCOUNTER — Telehealth: Payer: Self-pay

## 2018-09-25 NOTE — Telephone Encounter (Signed)
Spoke with Mardene Celeste concerning the upcoming appointments and the split in thew infusion date. Mailed a letter with a calender enclosed. Per 12/20 los

## 2018-10-02 ENCOUNTER — Ambulatory Visit (HOSPITAL_COMMUNITY)
Admission: RE | Admit: 2018-10-02 | Discharge: 2018-10-02 | Disposition: A | Discharge status: Home / Self Care | Payer: Medicare HMO | Source: Ambulatory Visit | Attending: Radiology | Admitting: Radiology

## 2018-10-02 ENCOUNTER — Ambulatory Visit (HOSPITAL_COMMUNITY)
Admission: RE | Admit: 2018-10-02 | Discharge: 2018-10-02 | Disposition: A | Discharge status: Home / Self Care | Payer: Medicare HMO | Source: Ambulatory Visit | Attending: Hematology | Admitting: Hematology

## 2018-10-02 DIAGNOSIS — C3491 Malignant neoplasm of unspecified part of right bronchus or lung: Secondary | ICD-10-CM | POA: Insufficient documentation

## 2018-10-02 DIAGNOSIS — J90 Pleural effusion, not elsewhere classified: Secondary | ICD-10-CM | POA: Diagnosis not present

## 2018-10-02 DIAGNOSIS — Z9889 Other specified postprocedural states: Secondary | ICD-10-CM

## 2018-10-02 LAB — BODY FLUID CELL COUNT WITH DIFFERENTIAL
Eos, Fluid: 1 %
LYMPHS FL: 27 %
Monocyte-Macrophage-Serous Fluid: 69 % (ref 50–90)
Neutrophil Count, Fluid: 3 % (ref 0–25)
Total Nucleated Cell Count, Fluid: 607 cu mm (ref 0–1000)

## 2018-10-02 LAB — GLUCOSE, PLEURAL OR PERITONEAL FLUID: Glucose, Fluid: 168 mg/dL

## 2018-10-02 LAB — LACTATE DEHYDROGENASE, PLEURAL OR PERITONEAL FLUID: LD FL: 96 U/L — AB (ref 3–23)

## 2018-10-02 LAB — PROTEIN, PLEURAL OR PERITONEAL FLUID: Total protein, fluid: 4.5 g/dL

## 2018-10-02 MED ORDER — LIDOCAINE HCL 1 % IJ SOLN
INTRAMUSCULAR | Status: AC
  Filled 2018-10-02: qty 20

## 2018-10-02 NOTE — Progress Notes (Signed)
Barry Booker    HEMATOLOGY/ONCOLOGY CLINIC NOTE  Date of Service: 10/03/2018  Patient Care Team: Barry Limbo, MD as PCP - General (Family Medicine)  CHIEF COMPLAINTS/PURPOSE OF CONSULTATION:  F/u for mx of lung cancer  HISTORY OF PRESENTING ILLNESS:   Barry Booker is a wonderful 73 y.o. male who has been referred to [place] by Dr Marshell Garfinkel for evaluation and management of presumed newly diagnosed rt sided lung cancer with concern for bronchial obstruction.  Patient has a history of hypertension, dyslipidemia, diabetes, COPD, alcohol abuse and previously had a chest x-ray on 02/20/2018 at [facility] which showed Right upper lobe consolidation/atelectasis may relate to post obstructive pneumonitis/pneumonia. An underlying obstructing lesion should be considered. Consider CT for further assessment.  H subsequently then had a CT of the chest on 02/27/2018 which showed -5.8 x 4.9 cm mass in the right mediastinum and hilum with associated severe narrowing of the right main pulmonary artery as well as right upper lobe and right middle lobe arteries. Obstructed right main stem bronchus and collapse of the right upper lobe and right middle lobe. Small right effusion. The appearance is similar to the patient's chest radiograph from 02/20/2018.  Patient subsequently had a PET CT on 03/10/2018 by Dr. Vaughan Browner which showed Large intensely hypermetabolic RIGHT hilar mass obstructs the RIGHT upper lobe bronchus and surrounds the bronchus intermedius. Post obstructive collapse of the RIGHT upper lobe.  No hypermetabolic mediastinal or supraclavicular lymph nodes. No evidence distant metastatic disease.  Patient had a flexible video fiberoptic bronchoscopy done on 03/21/2018 by Dr. Vaughan Browner which showed which showed some nodularity of the right mainstem bronchial mucosa with near complete occlusion of right main stem. Unable to traverse the occluded part of right main stem. The mucosa was friable with propensity to bleed.  Endobronchial biopsies and brushings were taken from the R mainstem and right upper lobe for pathology.  Preliminary results showed atypical cells however final cytology/pathology is currently pending.  Patient report rt sided chest pain with deep inspiration, anorexia and weight loss of about 30lbs in the last 6 months.  Interval History:   Barry Booker returns to clinic today for management and evaluation of his lung cancer, and C6 Durvalumab. The patient's last visit with [place] was on 09/19/18. He is accompanied today by his partner. The pt reports that he is doing well overall.   The pt reports that he has been able to breathe better since yesterday when his thoracentesis removed about a liter of fluid. He endorses "some hurting in the chest and back, that's not as bad" that has been stable. The pt notes that he has used Ibuprofen and Norco for this pain. He also notes that he has been taking Compazine "as a preventative thing," every 6 hours.  He notes that his previous right shoulder pain has resolved.  The pt was previously taking 70m Lasix, and Potassium replacement, and stopped taking this recently. The pt completed his Augmentin course and denies coughing up phlegm. He notes he is using his inhaler about every 3-4 hours.   The pt has not seen cardiology since February 2018, and chart review indicates the pt may have missed his one year follow up in 11/2017.   Of note prior to today's visit, the pt had a CXR yesterday, 10/02/18, which revealed No evidence of pneumothorax after RIGHT thoracentesis. 2. Persistent large, loculated RIGHT pleural effusion and associated dense passive atelectasis in the RIGHT LOWER LOBE. 3. No new abnormalities.  Lab results today (  10/03/18) of CBC w/diff and CMP is as follows: all values are WNL except for Glucose at 160. 10/03/18 TSH is pending  On review of systems, pt reports resolved right shoulder pain, improved breathing, stable energy levels, right sided  chest/back pain, eating well, and denies coughing up phlegm, concerns for infections, abdominal pains, leg swelling, and any other symptoms.   MEDICAL HISTORY:  Past Medical History:  Diagnosis Date  . Alcohol abuse   . COPD (chronic obstructive pulmonary disease) ([place])   . Diabetes mellitus without complication ([place])   . Gout   . High cholesterol   . Hypertension   . Malignant neoplasm of right upper lobe of lung ([place]) 03/29/2018    SURGICAL HISTORY: Past Surgical History:  Procedure Laterality Date  . COLONOSCOPY    . FLEXIBLE BRONCHOSCOPY [name] 03/21/2018   Procedure: FLEXIBLE BRONCHOSCOPY;  Surgeon: Marshell Garfinkel, MD;  Location: [facility];  Service: Pulmonary;  Laterality: [name];  . NOSE SURGERY  90's  . VIDEO BRONCHOSCOPY WITH ENDOBRONCHIAL ULTRASOUND [name] 03/21/2018   Procedure: VIDEO BRONCHOSCOPY WITH ENDOBRONCHIAL ULTRASOUND;  Surgeon: Marshell Garfinkel, MD;  Location: [place];  Service: Pulmonary;  Laterality: [name];    SOCIAL HISTORY: Social History   Socioeconomic History  . Marital status: Married    Spouse name: Not on file  . Number of children: Not on file  . Years of education: Not on file  . Highest education level: Not on file  Occupational History  . Not on file  Social Needs  . Financial resource strain: Not on file  . Food insecurity:    Worry: Not on file    Inability: Not on file  . Transportation needs:    Medical: Not on file    Non-medical: Not on file  Tobacco Use  . Smoking status: Former Smoker    Packs/day: 0.25    Years: 50.00    Pack years: 12.50    Types: Cigarettes    Last attempt to quit: 07/2017    Years since quitting: 1.2  . Smokeless tobacco: Never Used  . Tobacco comment: quit smoking 07/2017  Substance and Sexual Activity  . Alcohol use: Yes    Alcohol/week: 14.0 - 21.0 standard drinks    Types: 14 - 21 Cans of beer per week  . Drug use: No  . Sexual activity: Not on file  Lifestyle  . Physical activity:    Days per week: Not on  file    Minutes per session: Not on file  . Stress: Not on file  Relationships  . Social connections:    Talks on phone: Not on file    Gets together: Not on file    Attends religious service: Not on file    Active member of club or organization: Not on file    Attends meetings of clubs or organizations: Not on file    Relationship status: Not on file  . Intimate partner violence:    Fear of current or ex partner: Not on file    Emotionally abused: Not on file    Physically abused: Not on file    Forced sexual activity: Not on file  Other Topics Concern  . Not on file  Social History Narrative  . Not on file    FAMILY HISTORY: Family History  Problem Relation Age of Onset  . Arthritis Mother   . Cancer Father   . Cancer Sister   . Heart disease Maternal Uncle   . Colon cancer Neg Hx  ALLERGIES:  is allergic to neomycin-bacitracin zn-polymyx.  MEDICATIONS:  Current Outpatient Medications  Medication Sig Dispense Refill  . albuterol (PROVENTIL HFA;VENTOLIN HFA) 108 (90 Base) MCG/ACT inhaler Inhale 1-2 puffs into the lungs every 4 (four) hours as needed for wheezing or shortness of breath. 1 Inhaler 2  . aspirin 81 MG tablet Take 81 mg by mouth daily.    Barry Booker atorvastatin (LIPITOR) 40 MG tablet Take 40 mg by mouth daily.     . benazepril (LOTENSIN) 20 MG tablet Take 20 mg by mouth 2 (two) times daily.     . folic acid (FOLVITE) 253 MCG tablet Take 400 mcg by mouth daily.    Barry Booker HYDROcodone-acetaminophen (NORCO) 5-325 MG tablet 1/2 to 1 tablet 4 times daily as needed for pain. 60 tablet 0  . ibuprofen (ADVIL,MOTRIN) 200 MG tablet Take 400 mg by mouth every 6 (six) hours as needed for headache, mild pain or moderate pain.    . metFORMIN (GLUCOPHAGE) 500 MG tablet Take 500 mg by mouth every morning.    . prochlorperazine (COMPAZINE) 10 MG tablet TAKE 1 TABLET BY MOUTH EVERY 6 HOURS AS NEEDED FOR NAUSEA AND VOMITING 30 tablet 1  . amoxicillin-clavulanate (AUGMENTIN) 875-125 MG  tablet Take 1 tablet by mouth 2 (two) times daily. 20 tablet 0  . lidocaine (XYLOCAINE) 2 % solution Use as directed 15 mLs in the mouth or throat every 4 (four) hours as needed (esophagitis). Swish and swallow (Patient not taking: Reported on 08/22/2018) 100 mL 1  . sucralfate (CARAFATE) 1 g tablet Take 1 tablet (1 g total) by mouth 4 (four) times daily -  with meals and at bedtime. 5 min before meals for radiation induced esophagitis (Patient not taking: Reported on 10/03/2018) 120 tablet 2   No current facility-administered medications for this visit.     REVIEW OF SYSTEMS:    A 10+ POINT REVIEW OF SYSTEMS WAS OBTAINED including neurology, dermatology, psychiatry, cardiac, respiratory, lymph, extremities, GI, GU, Musculoskeletal, constitutional, breasts, reproductive, HEENT.  All pertinent positives are noted in the HPI.  All others are negative.   PHYSICAL EXAMINATION: ECOG PERFORMANCE STATUS: 1 - Symptomatic but completely ambulatory  Vitals:   10/03/18 0846 10/03/18 0903  BP: (!) 157/105 (!) 144/95  Pulse: (!) 110   Resp: 18   Temp: 97.8 F (36.6 C)   SpO2: 99%    Filed Weights   10/03/18 0846  Weight: 224 lb (101.6 kg)   .Body mass index is 32.14 kg/m.  GENERAL:alert, in no acute distress and comfortable SKIN: no acute rashes, no significant lesions EYES: conjunctiva are pink and non-injected, sclera anicteric OROPHARYNX: MMM, no exudates, no oropharyngeal erythema or ulceration NECK: supple, no JVD LYMPH:  no palpable lymphadenopathy in the cervical, axillary or inguinal regions LUNGS: decreased air entry in the right base  HEART: regular rate & rhythm ABDOMEN:  normoactive bowel sounds , non tender, not distended. No palpable hepatosplenomegaly.  Extremity: no pedal edema PSYCH: alert & oriented x 3 with fluent speech NEURO: no focal motor/sensory deficits   LABORATORY DATA:  I have reviewed the data as listed . CBC Latest Ref Rng & Units 10/03/2018 09/19/2018  09/14/2018  WBC 4.0 - 10.5 K/uL 9.4 11.8(H) 8.4  Hemoglobin 13.0 - 17.0 g/dL 13.7 12.5(L) 11.4(L)  Hematocrit 39.0 - 52.0 % 43.4 39.1 36.8(L)  Platelets 150 - 400 K/uL 153 159 138(L)    . CMP Latest Ref Rng & Units 10/03/2018 09/19/2018 09/14/2018  Glucose 70 - 99 mg/dL 160(H)  189(H) 119(H)  BUN 8 - 23 mg/dL _0 Creatinine 0.61 - 1.24 mg/dL 0.89 0.92 0.95  Sodium 135 - 145 mmol/L 140 141 143  Potassium 3.5 - 5.1 mmol/L 4.3 3.8 3.6  Chloride 98 - 111 mmol/L 105 106 106  CO2 22 - 32 mmol/L _1 Calcium 8.9 - 10.3 mg/dL 9.5 9.1 8.8(L)  Total Protein 6.5 - 8.1 g/dL 7.6 7.6 7.2  Total Bilirubin 0.3 - 1.2 mg/dL 0.4 0.3 0.2(L)  Alkaline Phos 38 - 126 U/L 70 57 46  AST 15 - 41 U/L 15 38 19  ALT 0 - 44 U/L _2 03/21/18   03/25/18 Endobronchial bx:     RADIOGRAPHIC STUDIES: I have personally reviewed the radiological images as listed and agreed with the findings in the report. Dg Chest 1 View  Result Date: 10/02/2018 CLINICAL DATA:  Post RIGHT thoracentesis. Current history of RIGHT UPPER LOBE lung cancer EXAM: CHEST  1 VIEW COMPARISON:  CT chest 09/18/2018. Chest x-rays 09/14/2018 and earlier. FINDINGS: No evidence of pneumothorax after RIGHT thoracentesis. Persistent large, loculated RIGHT pleural effusion and associated dense passive atelectasis in the RIGHT LOWER LOBE. Post radiation fibrosis involving the RIGHT UPPER LOBE, unchanged. Stable chronic bronchitic changes involving the LEFT lung. No new pulmonary parenchymal abnormalities in the LEFT lung. IMPRESSION: 1. No evidence of pneumothorax after RIGHT thoracentesis. 2. Persistent large, loculated RIGHT pleural effusion and associated dense passive atelectasis in the RIGHT LOWER LOBE. 3. No new abnormalities. Electronically Signed   By: Evangeline Dakin M.D.   On: 10/02/2018 09:57   Dg Chest 2 View  Result Date: 09/14/2018 CLINICAL DATA:  73 year old male with shortness of breath and weakness. History of treated  lung cancer. EXAM: CHEST - 2 VIEW COMPARISON:  Chest radiographs 08/29/2018, PET-CT 06/23/2018, and earlier. FINDINGS: Volume loss and architectural distortion in the right lung redemonstrated with superimposed right pleural effusion which appears moderate and mildly increased since November. This effusion was small in September. Stable rightward shift of the mediastinum. Stable mediastinal contour. Visualized tracheal air column is within normal limits. The left lung is stable and clear. Stable visualized osseous structures. Chronic mild compression fracture at the thoracolumbar junction. Paucity of bowel gas in the upper abdomen. IMPRESSION: 1. Increased right pleural effusion since November, now moderate, and superimposed on post treatment right lung architectural distortion. 2. No new cardiopulmonary abnormality identified. Electronically Signed   By: Genevie Ann M.D.   On: 09/14/2018 19:14   Ct Head Wo Contrast  Result Date: 09/14/2018 CLINICAL DATA:  Disoriented, altered level of consciousness. EXAM: CT HEAD WITHOUT CONTRAST TECHNIQUE: Contiguous axial images were obtained from the base of the skull through the vertex without intravenous contrast. COMPARISON:  None. FINDINGS: Brain: Diffuse atrophy. No acute intracranial abnormality. Specifically, no hemorrhage, hydrocephalus, mass lesion, acute infarction, or significant intracranial injury. Vascular: No hyperdense vessel or unexpected calcification. Skull: No acute calvarial abnormality. Sinuses/Orbits: No acute finding Other: None IMPRESSION: Cerebral atrophy.  No acute intracranial abnormality. Electronically Signed   By: Rolm Baptise M.D.   On: 09/14/2018 20:29   Ct Chest W Contrast  Result Date: 09/18/2018 CLINICAL DATA:  Non-small cell lung cancer, adenocarcinoma, metastatic, post first-line therapy, assess tx response Stage III lung cancer on maintainence Immunotherapy for assessment of treatment response EXAM: CT CHEST WITH CONTRAST TECHNIQUE:  Multidetector CT imaging of the chest was performed during intravenous contrast administration. CONTRAST:  102m OMNIPAQUE IOHEXOL 300 MG/ML  SOLN COMPARISON:  PET-CT 06/23/2018 FINDINGS: Cardiovascular: There is narrowing of the distal aspect of the RIGHT main pulmonary artery through the RIGHT hilum. Difficult to compare to comparison exams as they were noncontrast exams however favor similar findings. Coronary artery calcification and aortic atherosclerotic calcification. Mediastinum/Nodes: No axillary supraclavicular adenopathy. No mediastinal adenopathy. Lungs/Pleura: There is fullness about the RIGHT hilum again noted. There is masslike consolidation through this region which is similar to prior. There is severe narrowing of the RIGHT upper lobe and RIGHT middle lobe bronchi as well as the RIGHT lower lobe bronchus. There is perihilar linear thickening which appears slightly increased. Increase in pleural effusion. Upper Abdomen: Limited view of the liver, kidneys, pancreas are unremarkable. Normal adrenal glands. Musculoskeletal: No aggressive osseous lesion. IMPRESSION: 1. Rarely relatively stable perihilar consolidation on the RIGHT consistent post treatment change. Persistent narrowing of the proximal bronchi. 2. Interval increase in interstitial thickening in the RIGHT upper lobe. Differential include postobstructive process versus lymphangitic carcinoma. Favor postobstructive process. 3. Interval increase in RIGHT pleural effusion. 4. With potential progressive malignant findings versus progressive benign change, consider follow-up FDG PET scan. Electronically Signed   By: Suzy Bouchard M.D.   On: 09/18/2018 14:54   US Thoracentesis Asp Pleural Space W/img Guide  Result Date: 10/02/2018 INDICATION: Patient with history of lung cancer, dyspnea, right pleural effusion. Request made for diagnostic and therapeutic right thoracentesis. EXAM: ULTRASOUND GUIDED DIAGNOSTIC AND THERAPEUTIC RIGHT  THORACENTESIS MEDICATIONS: None COMPLICATIONS: None immediate. PROCEDURE: An ultrasound guided thoracentesis was thoroughly discussed with the patient and questions answered. The benefits, risks, alternatives and complications were also discussed. The patient understands and wishes to proceed with the procedure. Written consent was obtained. Ultrasound was performed to localize and mark an adequate pocket of fluid in the right chest. The area was then prepped and draped in the normal sterile fashion. 1% Lidocaine was used for local anesthesia. Under ultrasound guidance a 6 Fr Safe-T-Centesis catheter was introduced. Thoracentesis was performed. The catheter was removed and a dressing applied. FINDINGS: A total of approximately 900 cc of yellow fluid was removed. Samples were sent to the laboratory as requested by the clinical team. IMPRESSION: Successful ultrasound guided diagnostic and therapeutic right thoracentesis yielding 900 cc of pleural fluid. Read by: Rowe Robert, PA-C Electronically Signed   By: Markus Daft M.D.   On: 10/02/2018 10:29    ASSESSMENT & PLAN:   73 y.o.  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 Post obstructive collapse of the Right upper lobe. No hypermetabolic mediastinal or supraclavicular lymph nodes. No evidence of distant mets.  06/23/18 PET/CT revealed  Marked reduction in activity associated with the dominant right upper lobe central mass, maximum SUV 3.4 and previously 15.3. Please note that this lesion does still in case and markedly narrows the right upper lobe bronchus. 2. There is some patchy  ground-glass opacities with small airspace opacity components in the right upper lobe, right middle lobe, right lower lobe, and lingula. Some of these are hypermetabolic, for example lingular opacity has a maximum SUV of 6.2 and the right lower lobe bandlike opacity 5.7. These are probably inflammatory and merit surveillance. 3. Small to moderate right pleural effusion with accentuated  metabolic activity. Malignant effusion not excluded. 4. No findings of metastatic disease to the neck, abdomen/pelvis, or skeleton. 5. Aortic aneurysm NOS. Infrarenal aortic aneurysm 3.8 cm in diameter. Recommend followup by [place] in 2 years. 6. Other imaging findings of potential clinical significance: Chronic paranasal sinusitis. Aortic Atherosclerosis. Coronary atherosclerosis. Hepatic cirrhosis. Ventral hernia containing a loop of small bowel. Suspected late phase healing of the right anterior eighth rib.  09/18/18 CT Chest revealed Rarely relatively stable perihilar consolidation on the RIGHT consistent post treatment change. Persistent narrowing of the proximal bronchi. 2. Interval increase in interstitial thickening in the RIGHT upper lobe. Differential include postobstructive process versus lymphangitic carcinoma. Favor postobstructive process. 3. Interval increase in RIGHT pleural effusion. 4. With potential progressive malignant findings versus progressive benign change, consider follow-up FDG PET scan.   2) RUL airway obstruction with RUL lung collapse. 3) Concern for severe narrowing of the right main pulmonary artery as well as right upper lobe and right middle lobe arteries.   PLAN:  -Discussed pt labwork today, 10/03/18; all blood counts have normalized, chemistries are stable. TSH is pending.  -10/02/18 Cytology from thoracentesis showed no malignant cells. -Discussed 10/02/18 CXR which revealed No evidence of pneumothorax after RIGHT thoracentesis. 2. Persistent large, loculated RIGHT pleural effusion and associated  dense passive atelectasis in the RIGHT LOWER LOBE. 3. No new abnormalities. -Reviewed the last available ECHO from 01/19/16 which did reveal grade 2 diastolic dysfunction, and indicates that the pt could be volume overloaded from that stand point as well -Begin using Compazine only as needed for nausea relief -Will refer pt to return to care with his pulmonologist Dr. Vaughan Browner -Also will refer pt return to care with cardiology, has seen Richardson Dopp, [place] in 11/2016 -Recommend holding Durvalumab while pleural fluid is evaluated further, and optimizing diuretic therapy as well -Return to 10m Lasix and PO Potassium replacement, which the pt has at home  -Limit salt intake  -Post radiation pneumonitis vs immunotherapy triggered inflammation vs atypical infection felt less likely  -Benign vs pathologic effusion ? -If fluid is malignant, will likely change course with intended treatment -Did order empiric Augmentin, and held Durvalumab infusion at last visit to rule out possibility of immunotherapy caused inflammation -Finished Prednisone course -Continue Albuterol  -Advised that the pt follow up with his PCP for management of his BP -TSH WNL on 07/01/2018 --will monitor on PD1 inhibitor therapy -Current plan to complete maintenance Durvalumab for up to one year -Again counseled the pt towards complete smoking cessation -Recommend Senna S for occasional constipation  -Pt will let me know if he feels more SOB in the interim -Discussed a plan to reduce and taper Norco use for right sided back and chest pain -Will see the pt back in 6 weeks, with a repeat CXR,sooner if pleural fluid is malignant    Please cancel all currently scheduled treatments including the one tomorrow Referral to Pulmonary Dr MVaughan Brownerfor mx of pleural effusion Referral to Cardiology SRichardson Doppfor mx of Diastolic CHF and pulmonary HTN RTC with Dr KIrene Limbowith labs and CXR in 6 weeks   All of the patients questions were answered  with apparent satisfaction. The patient knows to call the clinic with any problems, questions or concerns.  The total time spent in the appt was 30 minutes and more than 50% was on counseling and direct patient cares.    GSullivan LoneMD MCalhounAAHIVMS SGolden Plains Community HospitalCPam Specialty Hospital Of Victoria SouthHematology/Oncology Physician [place] (Office):  [phone] (Work cell):  [phone] (Fax):           [phone]  I, Baldwin [place], am acting as a scribe for Dr. Sullivan Lone.   .I have reviewed the above documentation for accuracy and completeness, and I agree with the above. Brunetta Genera MD

## 2018-10-02 NOTE — Procedures (Signed)
Ultrasound-guided diagnostic and therapeutic right thoracentesis performed yielding 900 cc of yellow  fluid. No immediate complications. Follow-up chest x-ray pending. The fluid was sent to the lab for preordered studies. EBL< 1cc.

## 2018-10-03 ENCOUNTER — Inpatient Hospital Stay (HOSPITAL_BASED_OUTPATIENT_CLINIC_OR_DEPARTMENT_OTHER): Payer: Medicare HMO | Admitting: Hematology

## 2018-10-03 ENCOUNTER — Ambulatory Visit: Payer: Medicare HMO

## 2018-10-03 ENCOUNTER — Encounter: Payer: Self-pay | Admitting: Hematology

## 2018-10-03 ENCOUNTER — Inpatient Hospital Stay: Payer: Medicare HMO | Attending: Hematology

## 2018-10-03 ENCOUNTER — Other Ambulatory Visit: Payer: Medicare HMO

## 2018-10-03 VITALS — BP 144/95 | HR 110 | Temp 97.8°F | Resp 18 | Ht 70.0 in | Wt 224.0 lb

## 2018-10-03 DIAGNOSIS — C3401 Malignant neoplasm of right main bronchus: Secondary | ICD-10-CM | POA: Diagnosis present

## 2018-10-03 DIAGNOSIS — Z79899 Other long term (current) drug therapy: Secondary | ICD-10-CM | POA: Insufficient documentation

## 2018-10-03 DIAGNOSIS — E119 Type 2 diabetes mellitus without complications: Secondary | ICD-10-CM | POA: Diagnosis not present

## 2018-10-03 DIAGNOSIS — I5030 Unspecified diastolic (congestive) heart failure: Secondary | ICD-10-CM | POA: Diagnosis not present

## 2018-10-03 DIAGNOSIS — C3491 Malignant neoplasm of unspecified part of right bronchus or lung: Secondary | ICD-10-CM

## 2018-10-03 DIAGNOSIS — J90 Pleural effusion, not elsewhere classified: Secondary | ICD-10-CM

## 2018-10-03 DIAGNOSIS — I10 Essential (primary) hypertension: Secondary | ICD-10-CM

## 2018-10-03 DIAGNOSIS — Z7189 Other specified counseling: Secondary | ICD-10-CM

## 2018-10-03 LAB — CBC WITH DIFFERENTIAL/PLATELET
Abs Immature Granulocytes: 0.04 10*3/uL (ref 0.00–0.07)
Basophils Absolute: 0 10*3/uL (ref 0.0–0.1)
Basophils Relative: 0 %
Eosinophils Absolute: 0.2 10*3/uL (ref 0.0–0.5)
Eosinophils Relative: 3 %
HEMATOCRIT: 43.4 % (ref 39.0–52.0)
Hemoglobin: 13.7 g/dL (ref 13.0–17.0)
IMMATURE GRANULOCYTES: 0 %
LYMPHS ABS: 0.9 10*3/uL (ref 0.7–4.0)
Lymphocytes Relative: 9 %
MCH: 27.8 pg (ref 26.0–34.0)
MCHC: 31.6 g/dL (ref 30.0–36.0)
MCV: 88.2 fL (ref 80.0–100.0)
Monocytes Absolute: 0.8 10*3/uL (ref 0.1–1.0)
Monocytes Relative: 9 %
Neutro Abs: 7.4 10*3/uL (ref 1.7–7.7)
Neutrophils Relative %: 79 %
Platelets: 153 10*3/uL (ref 150–400)
RBC: 4.92 MIL/uL (ref 4.22–5.81)
RDW: 14.5 % (ref 11.5–15.5)
WBC: 9.4 10*3/uL (ref 4.0–10.5)
nRBC: 0 % (ref 0.0–0.2)

## 2018-10-03 LAB — CMP (CANCER CENTER ONLY)
ALT: 14 U/L (ref 0–44)
AST: 15 U/L (ref 15–41)
Albumin: 3.6 g/dL (ref 3.5–5.0)
Alkaline Phosphatase: 70 U/L (ref 38–126)
Anion gap: 12 (ref 5–15)
BUN: 14 mg/dL (ref 8–23)
CHLORIDE: 105 mmol/L (ref 98–111)
CO2: 23 mmol/L (ref 22–32)
Calcium: 9.5 mg/dL (ref 8.9–10.3)
Creatinine: 0.89 mg/dL (ref 0.61–1.24)
GFR, Est AFR Am: >60 mL/min (ref >60)
GFR, Estimated: >60 mL/min (ref >60)
Glucose, Bld: 160 mg/dL — ABNORMAL HIGH (ref 70–99)
Potassium: 4.3 mmol/L (ref 3.5–5.1)
Sodium: 140 mmol/L (ref 135–145)
Total Bilirubin: 0.4 mg/dL (ref 0.3–1.2)
Total Protein: 7.6 g/dL (ref 6.5–8.1)

## 2018-10-03 LAB — ACID FAST SMEAR (AFB)

## 2018-10-03 LAB — TSH: TSH: 11.138 u[IU]/mL — ABNORMAL HIGH (ref 0.320–4.118)

## 2018-10-03 LAB — PH, BODY FLUID: pH, Body Fluid: 7.5

## 2018-10-03 LAB — ACID FAST SMEAR (AFB, MYCOBACTERIA): Acid Fast Smear: NEGATIVE

## 2018-10-03 NOTE — Patient Instructions (Signed)
Thank you for choosing [facility] to provide your oncology and hematology care.  To afford each patient quality time with our providers, please arrive 30 minutes before your scheduled appointment time.  If you arrive late for your appointment, you may be asked to reschedule.  We strive to give you quality time with our providers, and arriving late affects you and other patients whose appointments are after yours.    If you are a no show for multiple scheduled visits, you may be dismissed from the clinic at the providers discretion.     Again, thank you for choosing [facility], our hope is that these requests will decrease the amount of time that you wait before being seen by our physicians.  ______________________________________________________________________   Should you have questions after your visit to the [facility], please contact our office at [phone] between the hours of 8:30 and 4:30 p.m.    Voicemails left after 4:30p.m will not be returned until the following business day.     For prescription refill requests, please have your pharmacy contact us directly.  Please also try to allow 48 hours for prescription requests.     Please contact the scheduling department for questions regarding scheduling.  For scheduling of procedures such as PET scans, CT scans, MRI, Ultrasound, etc please contact central scheduling at [phone].     Resources For Cancer Patients and Caregivers:    Oncolink.org:  A wonderful resource for patients and healthcare providers for information regarding your disease, ways to tract your treatment, what to expect, etc.      American Cancer Society:  800-227-2345  Can help patients locate various types of support and financial assistance   Cancer Care: 1-800-813-HOPE (4673) Provides financial assistance, online support groups, medication/co-pay assistance.     [place] DSS:  336-641-3447 Where to apply  for food stamps, Medicaid, and utility assistance   Medicare Rights Center: 800-333-4114 Helps people with Medicare understand their rights and benefits, navigate the Medicare system, and secure the quality healthcare they deserve   SCAT: 336-333-6589 [place] [employer]'s shared-ride transportation service for eligible riders who have a disability that prevents them from riding the fixed route bus.     For additional information on assistance programs please contact our social worker:   Abigail Elmore:  [phone]  

## 2018-10-04 ENCOUNTER — Ambulatory Visit: Payer: Medicare HMO

## 2018-10-05 LAB — BODY FLUID CULTURE: Culture: NO GROWTH

## 2018-10-06 ENCOUNTER — Other Ambulatory Visit: Payer: Self-pay | Admitting: *Deleted

## 2018-10-06 NOTE — Telephone Encounter (Signed)
Spouse called and left Voice Mail: Needs refill of pain medication. Refill request sent to Dr. Irene Limbo

## 2018-10-07 MED ORDER — HYDROCODONE-ACETAMINOPHEN 5-325 MG PO TABS: ORAL_TABLET | ORAL | 0 refills | Status: DC

## 2018-10-17 ENCOUNTER — Ambulatory Visit: Payer: Medicare HMO

## 2018-10-17 ENCOUNTER — Ambulatory Visit: Payer: Medicare HMO | Admitting: Hematology

## 2018-10-17 ENCOUNTER — Other Ambulatory Visit: Payer: Medicare HMO

## 2018-10-20 ENCOUNTER — Ambulatory Visit (INDEPENDENT_AMBULATORY_CARE_PROVIDER_SITE_OTHER)
Admission: RE | Admit: 2018-10-20 | Discharge: 2018-10-20 | Disposition: A | Discharge status: Home / Self Care | Payer: Medicare HMO | Source: Ambulatory Visit | Attending: Pulmonary Disease | Admitting: Pulmonary Disease

## 2018-10-20 ENCOUNTER — Ambulatory Visit (INDEPENDENT_AMBULATORY_CARE_PROVIDER_SITE_OTHER): Payer: Medicare HMO | Admitting: Pulmonary Disease

## 2018-10-20 ENCOUNTER — Encounter: Payer: Self-pay | Admitting: Pulmonary Disease

## 2018-10-20 VITALS — BP 124/78 | HR 111 | Wt 224.6 lb

## 2018-10-20 DIAGNOSIS — C3411 Malignant neoplasm of upper lobe, right bronchus or lung: Secondary | ICD-10-CM

## 2018-10-20 DIAGNOSIS — J449 Chronic obstructive pulmonary disease, unspecified: Secondary | ICD-10-CM

## 2018-10-20 DIAGNOSIS — J90 Pleural effusion, not elsewhere classified: Secondary | ICD-10-CM | POA: Diagnosis not present

## 2018-10-20 MED ORDER — UMECLIDINIUM-VILANTEROL 62.5-25 MCG/INH IN AEPB: 1.0000 | INHALATION_SPRAY | Freq: Every day | RESPIRATORY_TRACT | 0 refills | Status: AC

## 2018-10-20 MED ORDER — UMECLIDINIUM-VILANTEROL 62.5-25 MCG/INH IN AEPB: 1.0000 | INHALATION_SPRAY | Freq: Every day | RESPIRATORY_TRACT | 1 refills | Status: AC

## 2018-10-20 NOTE — Progress Notes (Signed)
Barry Booker    [id number]    22-Apr-1946  Primary Care Physician:Bouska, Shanon Brow, MD  Referring Physician: Bernerd Limbo, MD [place], [place] [address]  Chief complaint:  Follow-up for  COPD, stage IIIa squamous cell cancer Right upper lobe collapse due to tumor. Right pleural effusion.  HPI: 73 year old with COPD, hypertension, hyperlipidemia, diastolic heart failure He had a chest x-ray which showed right upper lobe collapse primary care.  A follow-up CT showed mediastinal mass with right upper lobe bronchial narrowing and collapse and has been referred to pulmonary for further evaluation.  Underwent bronchoscopy on 03/21/2018 showing squamous cell carcinoma.  Follows with Dr. Rudie Meyer and underwent post chemotherapy, radiation.  He is currently on immunotherapy with durvalumab.  Pets:No pets Occupation: Retired Games developer Exposures: No known exposures, no mold, hot tub Smoking history: 50-pack-year smoking history.  Quit in October 2018 Travel history: No significant travel Relevant family history: Father died of lung cancer.  Interim history: Noted to have large right pleural effusion.  Underwent thoracentesis on 1/2 with drainage of 900 cc of yellow fluid.  Pleural fluid analysis showed exudative effusion with no malignancy noted on cytology.  States that breathing is improved since drainage.   Outpatient Encounter Medications as of 10/20/2018  Medication Sig  . albuterol (PROVENTIL HFA;VENTOLIN HFA) 108 (90 Base) MCG/ACT inhaler Inhale 1-2 puffs into the lungs every 4 (four) hours as needed for wheezing or shortness of breath.  [name] aspirin 81 MG tablet Take 81 mg by mouth daily.  [name] atorvastatin (LIPITOR) 40 MG tablet Take 40 mg by mouth daily.   . benazepril (LOTENSIN) 20 MG tablet Take 20 mg by mouth 2 (two) times daily.   . folic acid (FOLVITE) 583 MCG tablet Take 400 mcg by mouth daily.  [name] HYDROcodone-acetaminophen (NORCO) 5-325 MG  tablet 1/2 to 1 tablet 4 times daily as needed for pain.  [name] ibuprofen (ADVIL,MOTRIN) 200 MG tablet Take 400 mg by mouth every 6 (six) hours as needed for headache, mild pain or moderate pain.  . metFORMIN (GLUCOPHAGE) 500 MG tablet Take 500 mg by mouth every morning.  . prochlorperazine (COMPAZINE) 10 MG tablet TAKE 1 TABLET BY MOUTH EVERY 6 HOURS AS NEEDED FOR NAUSEA AND VOMITING  . [DISCONTINUED] amoxicillin-clavulanate (AUGMENTIN) 875-125 MG tablet Take 1 tablet by mouth 2 (two) times daily.  . [DISCONTINUED] lidocaine (XYLOCAINE) 2 % solution Use as directed 15 mLs in the mouth or throat every 4 (four) hours as needed (esophagitis). Swish and swallow  . sucralfate (CARAFATE) 1 g tablet Take 1 tablet (1 g total) by mouth 4 (four) times daily -  with meals and at bedtime. 5 min before meals for radiation induced esophagitis (Patient not taking: Reported on 10/20/2018)   No facility-administered encounter medications on file as of 10/20/2018.    Physical Exam: Blood pressure 124/78, pulse (!) 111, weight 224 lb 9.6 oz (101.9 kg), SpO2 98 %. Gen:      No acute distress HEENT:  EOMI, sclera anicteric Neck:     No masses; no thyromegaly Lungs:    Clear to auscultation bilaterally; normal respiratory effort CV:         Regular rate and rhythm; no murmurs Abd:      + bowel sounds; soft, non-tender; no palpable masses, no distension Ext:    No edema; adequate peripheral perfusion Skin:      Warm and dry; no rash Neuro: alert and oriented x  3 Psych: normal mood and affect  Data Reviewed: Imaging CT chest 02/27/2018 5.8 x 4.9 cm mass in the right mediastinum and hilum with associated severe narrowing of the right main pulmonary artery as well as right upper lobe and right middle lobe arteries. Obstructed right main stem bronchus and collapse of the right  upper lobe and right middle lobe. Small right effusion. The appearance is similar to the patient's chest radiograph from 02/20/2018.  PET scan  03/31/7792- hypermetabolic right hilar mass with obstruction of the right upper lobe bronchus.  Postobstructive collapse of right upper lobe. I have reviewed the images personally.  CT chest 09/18/2018- recent right pleural effusion.  Right perihilar consolidation, hilar opacities.  I have reviewed the images personally.  PFTs 03/14/1989 FVC 1.74 [39%], FEV1 1.15 [35%], F/F 66, TLC 82%, DLCO 40% Severe obstruction, severe diffusion defect  Labs Metabolic profile 06/04/91-ZRAQTM normal limits except for glucose of 155 CBC 02/20/2018-WBC 14.2, hemoglobin 12.1, hematocrit 37, platelets 344  Procedures/Path 03/21/2018 Bronchoscopy with EBUS > Sq cell ca 10/02/2018-Rt thoracentesis > exudative, cytology- negative  Assessment:  Stage IIIa squamous lung cancer S/p chemotherapy and radiation.  Currently on immunotherapy with durvalumab  Right pleural effusion Thoracentesis hows exudative fluid with negative cytology I still suspect this is malignant effusion as pleural fluid cytology can sometimes miss malignancy Less likely to be secondary to heart failure or immunotherapy related pneumonitis as process is unilateral We will get a chest x-ray today.  If there is worsening effusion then he may need a repeat thoracentesis  Severe COPD Continue Anoro.  He is unable to afford inhalers We will give samples and start patient assistance. Monitor oxygen levels on exertion.  Health maintenance 08/20/2018-influenza 07/10/2013-Prevnar 07/19/2015-Pneumovax  Plan/Recommendations: - Continue Anoro - CXR today  Follow-up in 3 months  Marshell Garfinkel MD [facility] 10/20/2018, 9:59 AM  CC: Bernerd Limbo, MD

## 2018-10-20 NOTE — Patient Instructions (Signed)
We will get a chest x-ray today to reevaluate the pleural effusion Follow-up in 1 to 2 months with repeat chest x-ray.

## 2018-10-20 NOTE — Addendum Note (Signed)
Addended by: Maryanna Shape A on: 10/20/2018 11:10 AM   Modules accepted: Orders

## 2018-10-24 ENCOUNTER — Other Ambulatory Visit: Payer: Self-pay | Admitting: Pulmonary Disease

## 2018-10-24 DIAGNOSIS — J90 Pleural effusion, not elsewhere classified: Secondary | ICD-10-CM

## 2018-10-28 NOTE — Progress Notes (Signed)
Cardiology Office Note:    Date:  10/29/2018   ID:  [name], DOB 1946/01/28, MRN [id number]  PCP:  Bernerd Limbo, MD  Cardiologist:  Sherren Mocha, MD  / Richardson Dopp, PA-C  Electrophysiologist:  None  Oncologist:  Dr. Irene Limbo Pulmonologist:  Dr. Vaughan Browner  Referring MD: Brunetta Genera, MD   Chief Complaint  Patient presents with  . Follow-up    pleural effusion     History of Present Illness:    Barry Booker is a 73 y.o. male with diastolic CHF, remote pulmonary embolism in 2003, HTN, HL, diabetes, COPD, alcohol abuse, small AAA. 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 [place] in 9/16 2.7 x 3.4 distal aneurysm. This is followed by PCP. Of note, coronary calcifications were identified on CT in the past.  Echocardiogram in 4/17 demonstrated normal LV function and moderate diastolic dysfunction.  He was last seen in 11/2016.  He was dx with squamous cell lung CA in 2019.  he has been treated with chemotherapy and radiation.  He has undergone thoracentesis for R pleural effusion.  CXR has recently demonstrated recurrent R pleural effusion.  He has seen pulmonology in follow up and a CT is pending.  He was referred back to Cardiology by Dr. Irene Limbo.     [name] returns for evaluation of pleural effusion. He is here with his wife.  He saw Dr. Vaughan Browner last week and a CXR showed a RLL opacity.  CT scan was done earlier this AM.  Pleural fluid from early in January was exudative.  It sounds as though he may need to go for a Pleurx catheter if his CT confirms recurrent effusion.  The patient notes some chest discomfort.  This is located on his right lower chest and seems to be somewhat positional.  It is not clear that his chest pain is exertional.  He does note shortness of breath with most activities.  He sleeps on an incline chronically.  He denies PND or swelling.  He denies any significant cough.  Unfortunately he continues to smoke.  Prior CV studies:   The following studies were reviewed  today:  Echo 01/19/16 EF 55-60%, normal wall motion, grade 2 diastolic dysfunction, MAC, trace TR  Past Medical History:  Diagnosis Date  . Alcohol abuse   . COPD (chronic obstructive pulmonary disease) ([place])   . Diabetes mellitus without complication ([place])   . Gout   . High cholesterol   . Hypertension   . Malignant neoplasm of right upper lobe of lung ([place]) 03/29/2018   Surgical Hx: The patient  has a past surgical history that includes Nose surgery (90's); Colonoscopy; Video bronchoscopy with endobronchial ultrasound ([name], 03/21/2018); and Flexible bronchoscopy ([name], 03/21/2018).   Current Medications: Current Meds  Medication Sig  . albuterol (PROVENTIL HFA;VENTOLIN HFA) 108 (90 Base) MCG/ACT inhaler Inhale 1-2 puffs into the lungs every 4 (four) hours as needed for wheezing or shortness of breath.  [name] aspirin 81 MG tablet Take 81 mg by mouth daily.  [name] atorvastatin (LIPITOR) 40 MG tablet Take 40 mg by mouth daily.   . benazepril (LOTENSIN) 20 MG tablet Take 20 mg by mouth 2 (two) times daily.   . diphenhydrAMINE (BENADRYL) 12.5 MG/5ML elixir Take by mouth.  . folic acid (FOLVITE) 403 MCG tablet Take 400 mcg by mouth daily.  . furosemide (LASIX) 40 MG tablet Take 40 mg by mouth daily.   [name] HYDROcodone-acetaminophen (NORCO) 5-325 MG tablet 1/2 to 1 tablet 4 times  daily as needed for pain.  [name] ibuprofen (ADVIL,MOTRIN) 200 MG tablet Take 400 mg by mouth every 6 (six) hours as needed for headache, mild pain or moderate pain.  . metFORMIN (GLUCOPHAGE) 500 MG tablet Take 500 mg by mouth every morning.  . prochlorperazine (COMPAZINE) 10 MG tablet TAKE 1 TABLET BY MOUTH EVERY 6 HOURS AS NEEDED FOR NAUSEA AND VOMITING  . sucralfate (CARAFATE) 1 g tablet Take 1 tablet (1 g total) by mouth 4 (four) times daily -  with meals and at bedtime. 5 min before meals for radiation induced esophagitis  . [DISCONTINUED] benazepril (LOTENSIN) 20 MG tablet Take by mouth.  . [DISCONTINUED] prochlorperazine  (COMPAZINE) 10 MG tablet TAKE 1 TABLET BY MOUTH EVERY 6 HOURS AS NEEDED FOR NAUSEA AND VOMITING     Allergies:   Neomycin-bacitracin zn-polymyx   Social History   Tobacco Use  . Smoking status: Former Smoker    Packs/day: 0.25    Years: 50.00    Pack years: 12.50    Types: Cigarettes    Last attempt to quit: 07/2017    Years since quitting: 1.3  . Smokeless tobacco: Never Used  . Tobacco comment: quit smoking 07/2017  Substance Use Topics  . Alcohol use: Yes    Alcohol/week: 14.0 - 21.0 standard drinks    Types: 14 - 21 Cans of beer per week  . Drug use: No     Family Hx: The patient's family history includes Arthritis in his mother; Cancer in his father and sister; Heart disease in his maternal uncle. There is no history of Colon cancer.  ROS:   Please see the history of present illness.    Review of Systems  Constitution: Positive for decreased appetite and weight loss.  Cardiovascular: Positive for chest pain.  Respiratory: Positive for cough, shortness of breath and wheezing.   Musculoskeletal: Positive for back pain.  Neurological: Positive for dizziness.  Psychiatric/Behavioral: The patient is nervous/anxious.    All other systems reviewed and are negative.   EKGs/Labs/Other Test Reviewed:    EKG:  EKG is  ordered today.  The ekg ordered today demonstrates sinus tachycardia, HR 113, leftward axis, nonspecific ST-T wave changes, QTC 455, similar to prior tracings  Recent Labs: 07/01/2018: Magnesium 1.8 10/03/2018: ALT 14; BUN 14; Creatinine 0.89; Hemoglobin 13.7; Platelets 153; Potassium 4.3; Sodium 140; TSH 11.138   Recent Lipid Panel Lab Results  Component Value Date/Time   CHOL 223 (HH) 09/21/2008 09:33 AM   TRIG 62 09/21/2008 09:33 AM   HDL 46.0 09/21/2008 09:33 AM   CHOLHDL 4.8 CALC 09/21/2008 09:33 AM   LDLDIRECT 167.8 09/21/2008 09:33 AM    Physical Exam:    VS:  BP 100/78   Pulse (!) 116   Ht _0  (1.778 m)   Wt 225 lb 1.9 oz (102.1 kg)    SpO2 98%   BMI 32.30 kg/m     Wt Readings from Last 3 Encounters:  10/29/18 225 lb 1.9 oz (102.1 kg)  10/20/18 224 lb 9.6 oz (101.9 kg)  10/03/18 224 lb (101.6 kg)     Physical Exam  Constitutional: He is oriented to person, place, and time. He appears well-developed and well-nourished. No distress.  HENT:  Head: Normocephalic and atraumatic.  Eyes: No scleral icterus.  Neck: No JVD present. No thyromegaly present.  Cardiovascular: Regular rhythm. Tachycardia present.  No murmur heard. Pulmonary/Chest: He has decreased breath sounds (+Egophony R lower base). He has wheezes in the right lower field.  Abdominal:  Soft. He exhibits no distension.  Musculoskeletal:        General: No edema.  Lymphadenopathy:    He has no cervical adenopathy.  Neurological: He is alert and oriented to person, place, and time.  Skin: Skin is warm and dry.  Psychiatric: He has a normal mood and affect.    ASSESSMENT & PLAN:    Chronic diastolic CHF (congestive heart failure) (HCC)  He has had recurrent right pleural effusions recently.  I suspect that this is related to his malignancy.  Overall, his heart failure seems to be well controlled.  He was placed back on Lasix some time in the last several weeks.  He has not had an echocardiogram since April 2017.  Of note, when he was initially seen here back in 2017, he was seen by Dr. Lovena Le.  He does not have an EP issue and does not need to follow with Dr. Lovena Le.  I reviewed his case today with Dr. Burt Knack.  I will follow the patient along with Dr. Burt Knack going forward.  The patient will undergo a follow-up echocardiogram to reassess his LV function.  Follow-up 1 month.  If chest pain continues despite correction of his effusion, consider stress testing.   Pleural effusion on right Continue follow up with oncology and pulmonology.  He may need repeat thoracentesis.   Hypertensive heart disease with chronic diastolic congestive heart failure ([place]) The  patient's blood pressure is controlled on his current regimen.  Continue current therapy.    Squamous cell lung cancer, right (HCC) Continue FU with oncology.    Dispo:  Return in about 4 weeks (around 11/26/2018) for Routine Follow Up, w/ Richardson Dopp, PA-C.   Medication Adjustments/Labs and Tests Ordered: Current medicines are reviewed at length with the patient today.  Concerns regarding medicines are outlined above.  Tests Ordered: Orders Placed This Encounter  Procedures  . EKG 12-Lead  . ECHOCARDIOGRAM COMPLETE   Medication Changes: No orders of the defined types were placed in this encounter.   Signed, Richardson Dopp, PA-C  10/29/2018 5:35 PM    [facility] [address], [place], [place]  [address] Phone: [phone]; Fax: [phone]

## 2018-10-29 ENCOUNTER — Ambulatory Visit (INDEPENDENT_AMBULATORY_CARE_PROVIDER_SITE_OTHER)
Admission: RE | Admit: 2018-10-29 | Discharge: 2018-10-29 | Disposition: A | Discharge status: Home / Self Care | Payer: Medicare HMO | Source: Ambulatory Visit | Attending: Pulmonary Disease | Admitting: Pulmonary Disease

## 2018-10-29 ENCOUNTER — Ambulatory Visit: Payer: Medicare HMO | Admitting: Physician Assistant

## 2018-10-29 ENCOUNTER — Encounter: Payer: Self-pay | Admitting: Physician Assistant

## 2018-10-29 VITALS — BP 100/78 | HR 116 | Ht 70.0 in | Wt 225.1 lb

## 2018-10-29 DIAGNOSIS — C3491 Malignant neoplasm of unspecified part of right bronchus or lung: Secondary | ICD-10-CM | POA: Diagnosis not present

## 2018-10-29 DIAGNOSIS — J90 Pleural effusion, not elsewhere classified: Secondary | ICD-10-CM | POA: Diagnosis not present

## 2018-10-29 DIAGNOSIS — I110 Hypertensive heart disease with heart failure: Secondary | ICD-10-CM | POA: Diagnosis not present

## 2018-10-29 DIAGNOSIS — I5032 Chronic diastolic (congestive) heart failure: Secondary | ICD-10-CM

## 2018-10-29 NOTE — Patient Instructions (Addendum)
Medication Instructions:  Your physician recommends that you continue on your current medications as directed. Please refer to the Current Medication list given to you today.   If you need a refill on your cardiac medications before your next appointment, please call your pharmacy.   Lab work: NONE  If you have labs (blood work) drawn today and your tests are completely normal, you will receive your results only by: [name] MyChart Message (if you have MyChart) OR . A paper copy in the mail If you have any lab test that is abnormal or we need to change your treatment, we will call you to review the results.  Testing/Procedures: Your physician has requested that you have an echocardiogram. Echocardiography is a painless test that uses sound waves to create images of your heart. It provides your doctor with information about the size and shape of your heart and how well your heart's chambers and valves are working. This procedure takes approximately one hour. There are no restrictions for this procedure.    Follow-Up: At [facility], you and your health needs are our priority.  As part of our continuing mission to provide you with exceptional heart care, we have created designated Provider Care Teams.  These Care Teams include your primary Cardiologist (physician) and Advanced Practice Providers (APPs -  Physician Assistants and Nurse Practitioners) who all work together to provide you with the care you need, when you need it. You will need a follow up appointment in:  On 2/18 @ 8:45 am  Any Other Special Instructions Will Be Listed Below (If Applicable).

## 2018-11-03 ENCOUNTER — Other Ambulatory Visit: Payer: Self-pay | Admitting: *Deleted

## 2018-11-03 NOTE — Telephone Encounter (Signed)
Wife left voice mail - requested refill of husband's pain medicine

## 2018-11-05 ENCOUNTER — Telehealth: Payer: Self-pay | Admitting: Physician Assistant

## 2018-11-05 DIAGNOSIS — I5032 Chronic diastolic (congestive) heart failure: Secondary | ICD-10-CM

## 2018-11-05 MED ORDER — HYDROCODONE-ACETAMINOPHEN 5-325 MG PO TABS: ORAL_TABLET | ORAL | 0 refills | Status: DC

## 2018-11-05 NOTE — Telephone Encounter (Signed)
New Message   Patient wants to know about the insurance coverage and out of pocket cost for Echo.

## 2018-11-06 ENCOUNTER — Other Ambulatory Visit (HOSPITAL_COMMUNITY): Payer: Medicare HMO

## 2018-11-10 ENCOUNTER — Encounter: Payer: Self-pay | Admitting: Physician Assistant

## 2018-11-10 ENCOUNTER — Ambulatory Visit (HOSPITAL_COMMUNITY): Payer: Medicare HMO | Attending: Cardiology

## 2018-11-10 DIAGNOSIS — I5032 Chronic diastolic (congestive) heart failure: Secondary | ICD-10-CM | POA: Insufficient documentation

## 2018-11-11 NOTE — Telephone Encounter (Signed)
Reviewed results with pt's wife (DPR on file) who verbalized understanding. Per Richardson Dopp, PA-C to have pt get a repeat echo. Orders have been placed in epic.

## 2018-11-11 NOTE — Telephone Encounter (Signed)
-----   Message from Liliane Shi, [place] sent at 11/10/2018  5:21 PM EST ----- The echocardiogram demonstrates mildly depressed heart function (ejection fraction).  But the study was difficult to read.  Recommendations:  - Please arrange repeat echocardiogram with Definity contrast to better evaluate.  Please try to arrange prior to follow up appointment. Richardson Dopp, PA-C    11/10/2018 5:14 PM

## 2018-11-12 ENCOUNTER — Encounter: Payer: Self-pay | Admitting: Podiatry

## 2018-11-12 ENCOUNTER — Ambulatory Visit: Payer: Medicare HMO | Admitting: Podiatry

## 2018-11-12 DIAGNOSIS — E119 Type 2 diabetes mellitus without complications: Secondary | ICD-10-CM | POA: Diagnosis not present

## 2018-11-12 DIAGNOSIS — M19079 Primary osteoarthritis, unspecified ankle and foot: Secondary | ICD-10-CM

## 2018-11-12 DIAGNOSIS — M79676 Pain in unspecified toe(s): Secondary | ICD-10-CM

## 2018-11-12 DIAGNOSIS — B351 Tinea unguium: Secondary | ICD-10-CM

## 2018-11-12 NOTE — Progress Notes (Signed)
Patient ID: [name], male   DOB: 03/23/1946, 73 y.o.   MRN: [id number] Complaint:  Visit Type: Patient returns to my office for continued preventative foot care services. Complaint: Patient states" my nails have grown long and thick and become painful to walk and wear shoes" . The patient presents for preventative foot care services. No changes to ROS  Podiatric Exam: Vascular: dorsalis pedis and posterior tibial pulses are palpable bilateral. Capillary return is immediate. Temperature gradient is WNL. Skin turgor WNL  Sensorium: Normal Semmes Weinstein monofilament test. Normal tactile sensation bilaterally. Nail Exam: Pt has thick disfigured discolored nails with subungual debris noted bilateral entire nail hallux through fifth toenails Ulcer Exam: There is no evidence of ulcer or pre-ulcerative changes or infection. Orthopedic Exam: Muscle tone and strength are WNL. No limitations in general ROM. No crepitus or effusions noted. Foot type and digits show no abnormalities. Bony prominences are unremarkable. Skin: No Porokeratosis. No infection or ulcers  Diagnosis:  Onychomycosis, , Pain in right toe, pain in left toes  Treatment & Plan Procedures and Treatment: Consent by patient was obtained for treatment procedures. The patient understood the discussion of treatment and procedures well. All questions were answered thoroughly reviewed. Debridement of mycotic and hypertrophic toenails, 1 through 5 bilateral and clearing of subungual debris. No ulceration, no infection noted.  Return Visit-Office Procedure: Patient instructed to return to the office for a follow up visit 3 months for continued evaluation and treatment.  Gardiner Barefoot DPM

## 2018-11-13 ENCOUNTER — Telehealth: Payer: Self-pay

## 2018-11-13 NOTE — Telephone Encounter (Signed)
Spoke with pt's wife (DPR on file) and made her aware of pt rescheduled appt per Richardson Dopp, PA-C. Pt's wife was made aware of pt new appt on 2/26 @ 8:45 so that Richardson Dopp, PA-C can go over Echo results.

## 2018-11-14 LAB — ACID FAST CULTURE WITH REFLEXED SENSITIVITIES (MYCOBACTERIA): Acid Fast Culture: NEGATIVE

## 2018-11-17 ENCOUNTER — Inpatient Hospital Stay: Payer: Medicare HMO | Attending: Hematology

## 2018-11-17 DIAGNOSIS — C3401 Malignant neoplasm of right main bronchus: Secondary | ICD-10-CM | POA: Diagnosis not present

## 2018-11-17 DIAGNOSIS — E119 Type 2 diabetes mellitus without complications: Secondary | ICD-10-CM | POA: Diagnosis not present

## 2018-11-17 DIAGNOSIS — J90 Pleural effusion, not elsewhere classified: Secondary | ICD-10-CM

## 2018-11-17 DIAGNOSIS — E039 Hypothyroidism, unspecified: Secondary | ICD-10-CM | POA: Diagnosis not present

## 2018-11-17 DIAGNOSIS — J449 Chronic obstructive pulmonary disease, unspecified: Secondary | ICD-10-CM | POA: Insufficient documentation

## 2018-11-17 DIAGNOSIS — I10 Essential (primary) hypertension: Secondary | ICD-10-CM | POA: Diagnosis not present

## 2018-11-17 DIAGNOSIS — C3491 Malignant neoplasm of unspecified part of right bronchus or lung: Secondary | ICD-10-CM

## 2018-11-17 LAB — CBC WITH DIFFERENTIAL/PLATELET
Abs Immature Granulocytes: 0.05 10*3/uL (ref 0.00–0.07)
Basophils Absolute: 0 10*3/uL (ref 0.0–0.1)
Basophils Relative: 0 %
Eosinophils Absolute: 0.3 10*3/uL (ref 0.0–0.5)
Eosinophils Relative: 4 %
HCT: 39.9 % (ref 39.0–52.0)
Hemoglobin: 13 g/dL (ref 13.0–17.0)
Immature Granulocytes: 1 %
Lymphocytes Relative: 12 %
Lymphs Abs: 0.9 10*3/uL (ref 0.7–4.0)
MCH: 28.4 pg (ref 26.0–34.0)
MCHC: 32.6 g/dL (ref 30.0–36.0)
MCV: 87.3 fL (ref 80.0–100.0)
Monocytes Absolute: 0.9 10*3/uL (ref 0.1–1.0)
Monocytes Relative: 12 %
NEUTROS ABS: 5.4 10*3/uL (ref 1.7–7.7)
Neutrophils Relative %: 71 %
Platelets: 156 10*3/uL (ref 150–400)
RBC: 4.57 MIL/uL (ref 4.22–5.81)
RDW: 15.5 % (ref 11.5–15.5)
WBC: 7.5 10*3/uL (ref 4.0–10.5)
nRBC: 0 % (ref 0.0–0.2)

## 2018-11-17 LAB — CMP (CANCER CENTER ONLY)
ALT: 16 U/L (ref 0–44)
AST: 17 U/L (ref 15–41)
Albumin: 3.8 g/dL (ref 3.5–5.0)
Alkaline Phosphatase: 67 U/L (ref 38–126)
Anion gap: 12 (ref 5–15)
BUN: 23 mg/dL (ref 8–23)
CO2: 24 mmol/L (ref 22–32)
Calcium: 9.6 mg/dL (ref 8.9–10.3)
Chloride: 105 mmol/L (ref 98–111)
Creatinine: 1.25 mg/dL — ABNORMAL HIGH (ref 0.61–1.24)
GFR, Est AFR Am: >60 mL/min (ref >60)
GFR, Estimated: 58 mL/min — ABNORMAL LOW (ref >60)
Glucose, Bld: 199 mg/dL — ABNORMAL HIGH (ref 70–99)
POTASSIUM: 4.2 mmol/L (ref 3.5–5.1)
Sodium: 141 mmol/L (ref 135–145)
Total Bilirubin: 0.4 mg/dL (ref 0.3–1.2)
Total Protein: 7.9 g/dL (ref 6.5–8.1)

## 2018-11-18 ENCOUNTER — Ambulatory Visit: Payer: Medicare HMO | Admitting: Physician Assistant

## 2018-11-18 NOTE — Progress Notes (Signed)
[name]    HEMATOLOGY/ONCOLOGY CLINIC NOTE  Date of Service: 11/19/2018  Patient Care Team: Bernerd Limbo, MD as PCP - General (Family Medicine) Sherren Mocha, MD as PCP - Cardiology (Cardiology)  CHIEF COMPLAINTS/PURPOSE OF CONSULTATION:  F/u for mx of lung cancer  HISTORY OF PRESENTING ILLNESS:   Barry Booker is a wonderful 73 y.o. male who has been referred to [place] by Dr Marshell Garfinkel for evaluation and management of presumed newly diagnosed rt sided lung cancer with concern for bronchial obstruction.  Patient has a history of hypertension, dyslipidemia, diabetes, COPD, alcohol abuse and previously had a chest x-ray on 02/20/2018 at [facility] which showed Right upper lobe consolidation/atelectasis may relate to post obstructive pneumonitis/pneumonia. An underlying obstructing lesion should be considered. Consider CT for further assessment.  H subsequently then had a CT of the chest on 02/27/2018 which showed -5.8 x 4.9 cm mass in the right mediastinum and hilum with associated severe narrowing of the right main pulmonary artery as well as right upper lobe and right middle lobe arteries. Obstructed right main stem bronchus and collapse of the right upper lobe and right middle lobe. Small right effusion. The appearance is similar to the patient's chest radiograph from 02/20/2018.  Patient subsequently had a PET CT on 03/10/2018 by Dr. Vaughan Browner which showed Large intensely hypermetabolic RIGHT hilar mass obstructs the RIGHT upper lobe bronchus and surrounds the bronchus intermedius. Post obstructive collapse of the RIGHT upper lobe.  No hypermetabolic mediastinal or supraclavicular lymph nodes. No evidence distant metastatic disease.  Patient had a flexible video fiberoptic bronchoscopy done on 03/21/2018 by Dr. Vaughan Browner which showed which showed some nodularity of the right mainstem bronchial mucosa with near complete occlusion of right main stem. Unable to traverse the occluded part of right main stem.  The mucosa was friable with propensity to bleed. Endobronchial biopsies and brushings were taken from the R mainstem and right upper lobe for pathology.  Preliminary results showed atypical cells however final cytology/pathology is currently pending.  Patient report rt sided chest pain with deep inspiration, anorexia and weight loss of about 30lbs in the last 6 months.  Interval History:   Barry Booker returns to clinic today for management and evaluation of his lung cancer, and C6 Durvalumab. The patient's last visit with [place] was on 10/03/18. He is accompanied today by his partner. The pt reports that he is doing well overall.  In the interim the pt returned to care with Dr. Vaughan Browner in Pulmonology and Richardson Dopp, PA-C in Cardiology for further evaluation of his SOB. The pt notes that he continues on a water pill. He denies any current leg swelling. The pt notes that his "breathing is okay, but it gives out if I try to do too much." He is awaiting a repeat ECHO next week and will then follow up with cardiology after this.  The pt reports that his chest feels as though he has a "real bad sunburn," which has been present "for a long time." He is unsure if this began after he began radiation. He ntes that it is under the skin, near his sternum, exacerbated when coughing or moving his right arm, and is tender to palpation. The pt notes that he is "one coughing spell a day."   The pt notes that he is eating well, and has gained weight, endorsing a strong appetite.  Of note since the patient's last visit, pt has had a CT Chest completed on 10/29/18 with results revealing Interval decrease  in right pleural effusion with residual loculated component seen anteriorly and medially. 2. The interstitial and airspace disease in the posterior right upper lung is similar to prior. 3. Stable volume loss right hemithorax with right parahilar interstitial and airspace disease likely reflecting radiation fibrosis. 4.  Nodular liver morphology suggests cirrhosis.  Lab results (11/17/18) of CBC w/diff and CMP is as follows: all values are WNL except for Glucose at 199, Creatinine at 1.25, GFR at 58. 11/17/18 TSH is pending  On review of systems, pt reports stable breathing, eating well, weight gain, strong appetite, and denies any other symptoms.   MEDICAL HISTORY:  Past Medical History:  Diagnosis Date  . Alcohol abuse   . COPD (chronic obstructive pulmonary disease) ([place])   . Diabetes mellitus without complication ([place])   . Echocardiogram    Echo 11/2018: EF 45-50, mild MAC, mild calc of AoV (difficult study)  . Gout   . High cholesterol   . Hypertension   . Malignant neoplasm of right upper lobe of lung ([place]) 03/29/2018    SURGICAL HISTORY: Past Surgical History:  Procedure Laterality Date  . COLONOSCOPY    . FLEXIBLE BRONCHOSCOPY [name] 03/21/2018   Procedure: FLEXIBLE BRONCHOSCOPY;  Surgeon: Marshell Garfinkel, MD;  Location: [facility];  Service: Pulmonary;  Laterality: [name];  . NOSE SURGERY  90's  . VIDEO BRONCHOSCOPY WITH ENDOBRONCHIAL ULTRASOUND [name] 03/21/2018   Procedure: VIDEO BRONCHOSCOPY WITH ENDOBRONCHIAL ULTRASOUND;  Surgeon: Marshell Garfinkel, MD;  Location: [place];  Service: Pulmonary;  Laterality: [name];    SOCIAL HISTORY: Social History   Socioeconomic History  . Marital status: Married    Spouse name: Not on file  . Number of children: Not on file  . Years of education: Not on file  . Highest education level: Not on file  Occupational History  . Not on file  Social Needs  . Financial resource strain: Not on file  . Food insecurity:    Worry: Not on file    Inability: Not on file  . Transportation needs:    Medical: Not on file    Non-medical: Not on file  Tobacco Use  . Smoking status: Former Smoker    Packs/day: 0.25    Years: 50.00    Pack years: 12.50    Types: Cigarettes    Last attempt to quit: 07/2017    Years since quitting: 1.3  . Smokeless tobacco: Never Used  .  Tobacco comment: quit smoking 07/2017  Substance and Sexual Activity  . Alcohol use: Yes    Alcohol/week: 14.0 - 21.0 standard drinks    Types: 14 - 21 Cans of beer per week  . Drug use: No  . Sexual activity: Not on file  Lifestyle  . Physical activity:    Days per week: Not on file    Minutes per session: Not on file  . Stress: Not on file  Relationships  . Social connections:    Talks on phone: Not on file    Gets together: Not on file    Attends religious service: Not on file    Active member of club or organization: Not on file    Attends meetings of clubs or organizations: Not on file    Relationship status: Not on file  . Intimate partner violence:    Fear of current or ex partner: Not on file    Emotionally abused: Not on file    Physically abused: Not on file    Forced sexual activity: Not on  file  Other Topics Concern  . Not on file  Social History Narrative  . Not on file    FAMILY HISTORY: Family History  Problem Relation Age of Onset  . Arthritis Mother   . Cancer Father   . Cancer Sister   . Heart disease Maternal Uncle   . Colon cancer Neg Hx     ALLERGIES:  is allergic to neomycin-bacitracin zn-polymyx.  MEDICATIONS:  Current Outpatient Medications  Medication Sig Dispense Refill  . albuterol (PROVENTIL HFA;VENTOLIN HFA) 108 (90 Base) MCG/ACT inhaler Inhale 1-2 puffs into the lungs every 4 (four) hours as needed for wheezing or shortness of breath. 1 Inhaler 2  . aspirin 81 MG tablet Take 81 mg by mouth daily.    [name] atorvastatin (LIPITOR) 40 MG tablet Take 40 mg by mouth daily.     . benazepril (LOTENSIN) 20 MG tablet Take 20 mg by mouth 2 (two) times daily.     . diphenhydrAMINE (BENADRYL) 12.5 MG/5ML elixir Take by mouth.    . folic acid (FOLVITE) 097 MCG tablet Take 400 mcg by mouth daily.    . furosemide (LASIX) 40 MG tablet Take 40 mg by mouth daily.     [name] HYDROcodone-acetaminophen (NORCO) 5-325 MG tablet 1/2 to 1 tablet 4 times daily as needed  for pain. 60 tablet 0  . ibuprofen (ADVIL,MOTRIN) 200 MG tablet Take 400 mg by mouth every 6 (six) hours as needed for headache, mild pain or moderate pain.    . metFORMIN (GLUCOPHAGE) 500 MG tablet Take 500 mg by mouth every morning.    . prochlorperazine (COMPAZINE) 10 MG tablet TAKE 1 TABLET BY MOUTH EVERY 6 HOURS AS NEEDED FOR NAUSEA AND VOMITING    . sucralfate (CARAFATE) 1 g tablet Take 1 tablet (1 g total) by mouth 4 (four) times daily -  with meals and at bedtime. 5 min before meals for radiation induced esophagitis 120 tablet 2   No current facility-administered medications for this visit.     REVIEW OF SYSTEMS:    A 10+ POINT REVIEW OF SYSTEMS WAS OBTAINED including neurology, dermatology, psychiatry, cardiac, respiratory, lymph, extremities, GI, GU, Musculoskeletal, constitutional, breasts, reproductive, HEENT.  All pertinent positives are noted in the HPI.  All others are negative.   PHYSICAL EXAMINATION: ECOG PERFORMANCE STATUS: 1 - Symptomatic but completely ambulatory  Vitals:   11/19/18 1407  BP: 137/85  Pulse: 99  Resp: 17  Temp: 98.2 F (36.8 C)  SpO2: 99%   Filed Weights   11/19/18 1407  Weight: 229 lb 14.4 oz (104.3 kg)   .Body mass index is 32.99 kg/m.  GENERAL:alert, in no acute distress and comfortable SKIN: no acute rashes, no significant lesions EYES: conjunctiva are pink and non-injected, sclera anicteric OROPHARYNX: MMM, no exudates, no oropharyngeal erythema or ulceration NECK: supple, no JVD LYMPH:  no palpable lymphadenopathy in the cervical, axillary or inguinal regions LUNGS: decreased air entry in the right base HEART: regular rate & rhythm ABDOMEN:  normoactive bowel sounds , non tender, not distended. No palpable hepatosplenomegaly.  Extremity: no pedal edema PSYCH: alert & oriented x 3 with fluent speech NEURO: no focal motor/sensory deficits   LABORATORY DATA:  I have reviewed the data as listed . CBC Latest Ref Rng & Units  11/17/2018 10/03/2018 09/19/2018  WBC 4.0 - 10.5 K/uL 7.5 9.4 11.8(H)  Hemoglobin 13.0 - 17.0 g/dL 13.0 13.7 12.5(L)  Hematocrit 39.0 - 52.0 % 39.9 43.4 39.1  Platelets 150 - 400 K/uL 156 153  159    . CMP Latest Ref Rng & Units 11/17/2018 10/03/2018 09/19/2018  Glucose 70 - 99 mg/dL 199(H) 160(H) 189(H)  BUN 8 - 23 mg/dL _0 Creatinine 0.61 - 1.24 mg/dL 1.25(H) 0.89 0.92  Sodium 135 - 145 mmol/L 141 140 141  Potassium 3.5 - 5.1 mmol/L 4.2 4.3 3.8  Chloride 98 - 111 mmol/L 105 105 106  CO2 22 - 32 mmol/L _1 Calcium 8.9 - 10.3 mg/dL 9.6 9.5 9.1  Total Protein 6.5 - 8.1 g/dL 7.9 7.6 7.6  Total Bilirubin 0.3 - 1.2 mg/dL 0.4 0.4 0.3  Alkaline Phos 38 - 126 U/L 67 70 57  AST 15 - 41 U/L 17 15 38  ALT 0 - 44 U/L _2 03/21/18   03/25/18 Endobronchial bx:     RADIOGRAPHIC STUDIES: I have personally reviewed the radiological images as listed and agreed with the findings in the report. Ct Chest Wo Contrast  Result Date: 10/29/2018 CLINICAL DATA:  Pleural effusion. EXAM: CT CHEST WITHOUT CONTRAST TECHNIQUE: Multidetector CT imaging of the chest was performed following the standard protocol without IV contrast. COMPARISON:  09/18/2018 FINDINGS: Cardiovascular: The heart size is normal. No substantial pericardial effusion. Coronary artery calcification is evident. Atherosclerotic calcification is noted in the wall of the thoracic aorta. Mediastinum/Nodes: Scattered small mediastinal lymph nodes are similar to prior no left hilar lymphadenopathy evident although assessment limited by noncontrast exam. Similar appearance of the abnormal soft tissue attenuation in the right hilum, likely treatment related. The esophagus has normal imaging features. There is no axillary lymphadenopathy. Lungs/Pleura: The central tracheobronchial airways are patent. Volume loss right hemithorax is stable. Right parahilar interstitial and airspace disease is similar to prior. Right pleural effusion has  decreased in the interval with residual loculated component seen anteriorly and medially. Scarring in the medial left upper lobe is stable. No left pleural effusion. Upper Abdomen: Nodular liver morphology suggests cirrhosis. Musculoskeletal: No worrisome lytic or sclerotic osseous abnormality. Age indeterminate L1 compression deformity IMPRESSION: 1. Interval decrease in right pleural effusion with residual loculated component seen anteriorly and medially. 2. The interstitial and airspace disease in the posterior right upper lung is similar to prior. 3. Stable volume loss right hemithorax with right parahilar interstitial and airspace disease likely reflecting radiation fibrosis. 4. Nodular liver morphology suggests cirrhosis. Electronically Signed   By: Misty Stanley M.D.   On: 10/29/2018 15:24    ASSESSMENT & PLAN:   73 y.o.  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 Post obstructive collapse of the Right upper lobe. No hypermetabolic mediastinal or supraclavicular lymph nodes. No evidence of distant mets.  06/23/18 PET/CT revealed  Marked reduction in activity associated with the dominant right upper lobe central mass, maximum SUV 3.4 and previously 15.3. Please note that this lesion does still in case and markedly narrows the right upper lobe bronchus. 2. There is some patchy ground-glass opacities with small airspace opacity components in the right upper lobe, right middle lobe, right lower lobe, and lingula. Some of these are hypermetabolic, for example lingular opacity has a maximum SUV of 6.2 and the right lower lobe  bandlike  opacity 5.7. These are probably inflammatory and merit surveillance. 3. Small to moderate right pleural effusion with accentuated metabolic activity. Malignant effusion not excluded. 4. No findings of metastatic disease to the neck, abdomen/pelvis, or skeleton. 5. Aortic aneurysm NOS. Infrarenal aortic aneurysm 3.8 cm in diameter. Recommend followup by [place] in 2 years. 6. Other imaging findings of potential clinical significance: Chronic paranasal sinusitis. Aortic Atherosclerosis. Coronary atherosclerosis. Hepatic cirrhosis. Ventral hernia containing a loop of small bowel. Suspected late phase healing of the right anterior eighth rib.  09/18/18 CT Chest revealed Rarely relatively stable perihilar consolidation on the RIGHT consistent post treatment change. Persistent narrowing of the proximal bronchi. 2. Interval increase in interstitial thickening in the RIGHT upper lobe. Differential include postobstructive process versus lymphangitic carcinoma. Favor postobstructive process. 3. Interval increase in RIGHT pleural effusion. 4. With potential progressive malignant findings versus progressive benign change, consider follow-up FDG PET scan.   10/02/18 Cytology from thoracentesis showed no malignant cells. 10/02/18 CXR revealed No evidence of pneumothorax after RIGHT thoracentesis. 2. Persistent large, loculated RIGHT pleural effusion and associated dense passive atelectasis in the RIGHT LOWER LOBE. 3. No new abnormalities.  2) RUL airway obstruction with RUL lung collapse. 3) Concern for severe narrowing of the right main pulmonary artery as well as right upper lobe and right middle lobe arteries.   PLAN:  -Discussed pt labwork from 11/17/18; blood counts are normal, chemistries stable. TSH is pending. -Discussed the 10/29/18 CT Chest which revealed Interval decrease in right pleural effusion with residual loculated component seen anteriorly and medially. 2. The interstitial and airspace disease in the  posterior right upper lung is similar to prior. 3. Stable volume loss right hemithorax with right parahilar interstitial and airspace disease likely reflecting radiation fibrosis. 4. Nodular liver morphology suggests cirrhosis. -Pulmonology feels that right pleural effusion is not related to immunotherapy nor heart failure. Suspects effusion is malignant and that cytology may have missed this, and thoracentesis showed exudative fluid. -The pt prefers to continue holding immunotherapy during the next 1-2 months  -Reviewed the last available ECHO from 01/19/16 which did reveal grade 2 diastolic dysfunction, and indicates that the pt could be volume overloaded from that stand point as well -Proceed with repeat ECHO on 11/21/18 -Continue follow up with Pulmonology and Cardiology -Begin using Compazine only as needed for nausea relief -Return to 11m Lasix and PO Potassium replacement, which the pt has at home  -Limit salt intake -If fluid is malignant, will likely change course with intended treatment -Continue Albuterol  -Advised that the pt follow up with his PCP for management of his BP -TSH WNL on 07/01/2018 --will monitor on PD1 inhibitor therapy -Current plan to complete maintenance Durvalumab for up to one year -Again counseled the pt towards complete smoking cessation -Recommend Senna S for occasional constipation  -Pt will let me know if he feels more SOB in the interim -Discussed a plan to reduce and taper Norco use for right sided back and chest pain -Costal chondritis, recommend using a small pillow or bracing himself when coughing. Also recommend 4% lidocaine patch OTC -Will see the pt back in 6 weeks with labs  3.  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 The patient knows to call the clinic with any problems, questions or concerns.  The total time spent in the appt was 30 minutes and more than 50% was on counseling and direct patient cares.    Sullivan Lone MD [place] AAHIVMS [facility] Hematology/Oncology Physician [facility]  (Office):       [phone] (Work cell):  [phone] (Fax):           [phone]  I, Baldwin [place], am acting as a scribe for Dr. Sullivan Lone.   .I have reviewed the above documentation for accuracy and completeness, and I agree with the above. Brunetta Genera MD

## 2018-11-19 ENCOUNTER — Inpatient Hospital Stay: Payer: Medicare HMO

## 2018-11-19 ENCOUNTER — Inpatient Hospital Stay: Payer: Medicare HMO | Admitting: Hematology

## 2018-11-19 ENCOUNTER — Other Ambulatory Visit: Payer: Self-pay | Admitting: *Deleted

## 2018-11-19 ENCOUNTER — Telehealth: Payer: Self-pay | Admitting: Hematology

## 2018-11-19 VITALS — BP 137/85 | HR 99 | Temp 98.2°F | Resp 17 | Ht 70.0 in | Wt 229.9 lb

## 2018-11-19 DIAGNOSIS — E119 Type 2 diabetes mellitus without complications: Secondary | ICD-10-CM

## 2018-11-19 DIAGNOSIS — C3491 Malignant neoplasm of unspecified part of right bronchus or lung: Secondary | ICD-10-CM

## 2018-11-19 DIAGNOSIS — R7989 Other specified abnormal findings of blood chemistry: Secondary | ICD-10-CM

## 2018-11-19 DIAGNOSIS — E039 Hypothyroidism, unspecified: Secondary | ICD-10-CM | POA: Diagnosis not present

## 2018-11-19 DIAGNOSIS — J90 Pleural effusion, not elsewhere classified: Secondary | ICD-10-CM

## 2018-11-19 DIAGNOSIS — I10 Essential (primary) hypertension: Secondary | ICD-10-CM

## 2018-11-19 DIAGNOSIS — C3401 Malignant neoplasm of right main bronchus: Secondary | ICD-10-CM

## 2018-11-19 DIAGNOSIS — J449 Chronic obstructive pulmonary disease, unspecified: Secondary | ICD-10-CM

## 2018-11-19 NOTE — Patient Instructions (Signed)
Thank you for choosing [facility] to provide your oncology and hematology care.  To afford each patient quality time with our providers, please arrive 30 minutes before your scheduled appointment time.  If you arrive late for your appointment, you may be asked to reschedule.  We strive to give you quality time with our providers, and arriving late affects you and other patients whose appointments are after yours.    If you are a no show for multiple scheduled visits, you may be dismissed from the clinic at the providers discretion.     Again, thank you for choosing [facility], our hope is that these requests will decrease the amount of time that you wait before being seen by our physicians.  ______________________________________________________________________   Should you have questions after your visit to the [facility], please contact our office at [phone] between the hours of 8:30 and 4:30 p.m.    Voicemails left after 4:30p.m will not be returned until the following business day.     For prescription refill requests, please have your pharmacy contact us directly.  Please also try to allow 48 hours for prescription requests.     Please contact the scheduling department for questions regarding scheduling.  For scheduling of procedures such as PET scans, CT scans, MRI, Ultrasound, etc please contact central scheduling at [phone].     Resources For Cancer Patients and Caregivers:    Oncolink.org:  A wonderful resource for patients and healthcare providers for information regarding your disease, ways to tract your treatment, what to expect, etc.      American Cancer Society:  800-227-2345  Can help patients locate various types of support and financial assistance   Cancer Care: 1-800-813-HOPE (4673) Provides financial assistance, online support groups, medication/co-pay assistance.     [place] DSS:  336-641-3447 Where to apply  for food stamps, Medicaid, and utility assistance   Medicare Rights Center: 800-333-4114 Helps people with Medicare understand their rights and benefits, navigate the Medicare system, and secure the quality healthcare they deserve   SCAT: 336-333-6589 [employer] [employer]'s shared-ride transportation service for eligible riders who have a disability that prevents them from riding the fixed route bus.     For additional information on assistance programs please contact our social worker:   Abigail Elmore:  [phone]  

## 2018-11-19 NOTE — Telephone Encounter (Signed)
Scheduled appt per 2/19 los - gave patient AVS and calender per los.

## 2018-11-20 LAB — THYROID PANEL WITH TSH
Free Thyroxine Index: 1.2 (ref 1.2–4.9)
T3 Uptake Ratio: 23 % — ABNORMAL LOW (ref 24–39)
T4 TOTAL: 5.1 ug/dL (ref 4.5–12.0)
TSH: 28.03 u[IU]/mL — ABNORMAL HIGH (ref 0.450–4.500)

## 2018-11-21 ENCOUNTER — Ambulatory Visit: Payer: Medicare HMO | Admitting: Physician Assistant

## 2018-11-21 ENCOUNTER — Other Ambulatory Visit (HOSPITAL_COMMUNITY): Payer: Medicare HMO

## 2018-11-24 ENCOUNTER — Ambulatory Visit (HOSPITAL_COMMUNITY): Payer: Medicare HMO | Attending: Cardiology

## 2018-11-24 DIAGNOSIS — I5032 Chronic diastolic (congestive) heart failure: Secondary | ICD-10-CM | POA: Diagnosis not present

## 2018-11-24 MED ORDER — PERFLUTREN LIPID MICROSPHERE
1.0000 mL | INTRAVENOUS | Status: AC | PRN
  Administered 2018-11-24: 2 mL via INTRAVENOUS

## 2018-11-25 ENCOUNTER — Encounter: Payer: Self-pay | Admitting: Physician Assistant

## 2018-11-25 MED ORDER — LEVOTHYROXINE SODIUM 25 MCG PO TABS: 25.0000 ug | ORAL_TABLET | Freq: Every day | ORAL | 1 refills | Status: DC

## 2018-11-25 NOTE — Progress Notes (Signed)
Cardiology Office Note:    Date:  11/26/2018   ID:  Barry Booker, DOB March 24, 1946, MRN [id number]  PCP:  Bernerd Limbo, MD  Cardiologist:  Sherren Mocha, MD / Richardson Dopp, PA-C  Electrophysiologist:  None  Oncologist:  Dr. Irene Limbo Pulmonologist:  Dr. Vaughan Browner  Referring MD: Bernerd Limbo, MD   Chief Complaint  Patient presents with  . Follow-up    CHF     History of Present Illness:    Barry Booker is a 73 y.o. male with diastolic CHF, remote pulmonary embolism in 2003, HTN, HL, diabetes, COPD, alcohol abuse, small AAA. 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 [place] in 9/16 2.7 x 3.4 distal aneurysm. This is followed by his PCP. Of note, coronary calcifications were identified on CT in the past. Echocardiogram in 4/17 demonstrated normal LV function and moderate diastolic dysfunction.  He was dx with squamous cell lung CA in 2019.  He has been treated with chemotherapy and radiation.  He has had recurrent R pleural effusion and has previously undergone thoracentesis.  He was last seen 10/29/2018.  Barry Booker returns for follow up.  A follow up CT last month showed decreased fluid in his R lung.  He has had chronic right-sided chest discomfort described as burning since radiation was started for his cancer.  This is unchanged.  He denies any exertional symptoms.  He notes chronic shortness of breath with exertion.  This is unchanged.  He sleeps on an incline chronically.  He denies PND or lower extremity swelling.  He denies syncope.   Prior CV studies:   The following studies were reviewed today:  Limited Echo 11/24/2018 ([facility]) Normal RVSF, EF 50-55  Echo 11/10/2018 EF 45-50, mild MAC, tech diff study  Echo 01/19/16 EF 55-60%, normal wall motion, grade 2 diastolic dysfunction, MAC, trace TR   Past Medical History:  Diagnosis Date  . Alcohol abuse   . COPD (chronic obstructive pulmonary disease) ([place])   . Diabetes mellitus without complication ([place])   . Echocardiogram    Echo 11/2018:  EF 45-50, mild MAC, mild calc of AoV (difficult study) >> Limited echo with [facility] contrast 11/2018: EF 50-55  . Gout   . High cholesterol   . Hypertension   . Malignant neoplasm of right upper lobe of lung ([place]) 03/29/2018   Surgical Hx: The patient  has a past surgical history that includes Nose surgery (90's); Colonoscopy; Video bronchoscopy with endobronchial ultrasound ([name], 03/21/2018); and Flexible bronchoscopy ([name], 03/21/2018).   Current Medications: Current Meds  Medication Sig  . albuterol (PROVENTIL HFA;VENTOLIN HFA) 108 (90 Base) MCG/ACT inhaler Inhale 1-2 puffs into the lungs every 4 (four) hours as needed for wheezing or shortness of breath.  [name] aspirin 81 MG tablet Take 81 mg by mouth daily.  [name] atorvastatin (LIPITOR) 40 MG tablet Take 40 mg by mouth daily.   . benazepril (LOTENSIN) 20 MG tablet Take 20 mg by mouth 2 (two) times daily.   . diphenhydrAMINE (BENADRYL) 12.5 MG/5ML elixir Take by mouth.  . folic acid (FOLVITE) 992 MCG tablet Take 400 mcg by mouth daily.  . furosemide (LASIX) 40 MG tablet Take 40 mg by mouth daily.   [name] HYDROcodone-acetaminophen (NORCO) 5-325 MG tablet 1/2 to 1 tablet 4 times daily as needed for pain.  [name] ibuprofen (ADVIL,MOTRIN) 200 MG tablet Take 400 mg by mouth every 6 (six) hours as needed for headache, mild pain or moderate pain.  [name] levothyroxine (SYNTHROID, LEVOTHROID) 25 MCG tablet Take 1  tablet (25 mcg total) by mouth daily before breakfast.  . metFORMIN (GLUCOPHAGE) 500 MG tablet Take 500 mg by mouth every morning.  . prochlorperazine (COMPAZINE) 10 MG tablet TAKE 1 TABLET BY MOUTH EVERY 6 HOURS AS NEEDED FOR NAUSEA AND VOMITING  . [DISCONTINUED] sucralfate (CARAFATE) 1 g tablet Take 1 tablet (1 g total) by mouth 4 (four) times daily -  with meals and at bedtime. 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 Smoking status: Former Smoker     Packs/day: 0.25    Years: 50.00    Pack years: 12.50    Types: Cigarettes    Last attempt to quit: 07/2017    Years since quitting: 1.4  . Smokeless tobacco: Never Used  . Tobacco comment: quit smoking 07/2017  Substance Use Topics  . Alcohol use: Yes    Alcohol/week: 14.0 - 21.0 standard drinks    Types: 14 - 21 Cans of beer per week  . Drug use: No     Family Hx: The patient's family history includes Arthritis in his mother; Cancer in his father and sister; Heart disease in his maternal uncle. There is no history of Colon cancer.  ROS:   Please see the history of present illness.    Review of Systems  Musculoskeletal: Positive for myalgias.  Neurological: Positive for loss of balance.   All other systems reviewed and are negative.   EKGs/Labs/Other Test Reviewed:    EKG:  EKG is not ordered today.    Recent Labs: 07/01/2018: Magnesium 1.8 11/17/2018: ALT 16; BUN 23; Creatinine 1.25; Hemoglobin 13.0; Platelets 156; Potassium 4.2; Sodium 141 11/19/2018: TSH 28.030   Recent Lipid Panel Lab Results  Component Value Date/Time   CHOL 223 (HH) 09/21/2008 09:33 AM   TRIG 62 09/21/2008 09:33 AM   HDL 46.0 09/21/2008 09:33 AM   CHOLHDL 4.8 CALC 09/21/2008 09:33 AM   LDLDIRECT 167.8 09/21/2008 09:33 AM    Physical Exam:    VS:  BP 122/80   Pulse (!) 110   Ht _0  (1.778 m)   Wt 234 lb 1.9 oz (106.2 kg)   SpO2 97%   BMI 33.59 kg/m     Wt Readings from Last 3 Encounters:  11/26/18 234 lb 1.9 oz (106.2 kg)  11/19/18 229 lb 14.4 oz (104.3 kg)  10/29/18 225 lb 1.9 oz (102.1 kg)     Physical Exam  Constitutional: He is oriented to person, place, and time. He appears well-developed and well-nourished. No distress.  HENT:  Head: Normocephalic and atraumatic.  Eyes: No scleral icterus.  Neck: No JVD present. No thyromegaly present.  Cardiovascular: Normal rate and regular rhythm.  No murmur heard. Pulmonary/Chest: He has wheezes in the right lower field. He has rales  in the right lower field.  Abdominal: Soft.  Musculoskeletal:        General: No edema.  Lymphadenopathy:    He has no cervical adenopathy.  Neurological: He is alert and oriented to person, place, and time.  Skin: Skin is warm and dry.  Psychiatric: He has a normal mood and affect.    ASSESSMENT & PLAN:    Chronic diastolic CHF (congestive heart failure) (HCC) Recent echocardiogram with EF 50-55.  He is NYHA 2.  Volume status appears stable.  I suspect most of his shortness of breath is related to COPD and lung cancer.  Continue current diuretic  dose.  Hypertensive heart disease with chronic diastolic congestive heart failure ([place]) The patient's blood pressure is controlled on his current regimen.  Continue current therapy.   Pleural effusion on right Recent CT scan with decreased size.  This is followed closely by pulmonology and oncology.  Squamous cell lung cancer, right [facility]) Continue follow-up with oncology.  Sinus tachycardia He has persistently elevated heart rate.  It appears that this started when his cancer diagnosis was made.  Recent TSH was elevated and he was placed on Synthroid.  Recent hemoglobin looked fairly stable.  I suspect that he has an increased adrenergic state related to his cancer.  I suspect he may feel better with a slower heart rate to allow for longer diastolic filling.  I will try him on low-dose beta-blocker.  Start metoprolol succinate 25 mg daily.   Dispo:  Return in about 3 months (around 02/24/2019) for Routine Follow Up, w/ Dr. Burt Knack, or Richardson Dopp, PA-C.   Medication Adjustments/Labs and Tests Ordered: Current medicines are reviewed at length with the patient today.  Concerns regarding medicines are outlined above.  Tests Ordered: No orders of the defined types were placed in this encounter.  Medication Changes: Meds ordered this encounter  Medications  . metoprolol succinate (TOPROL XL) 25 MG 24 hr tablet    Sig: Take 1 tablet (25 mg  total) by mouth daily.    Dispense:  90 tablet    Refill:  3    Signed, Richardson Dopp, PA-C  11/26/2018 4:54 PM    [facility] [place], [place], [place]  [address] Phone: [phone]; Fax: [phone]

## 2018-11-26 ENCOUNTER — Ambulatory Visit (INDEPENDENT_AMBULATORY_CARE_PROVIDER_SITE_OTHER): Payer: Medicare HMO | Admitting: Physician Assistant

## 2018-11-26 ENCOUNTER — Telehealth: Payer: Self-pay | Admitting: *Deleted

## 2018-11-26 ENCOUNTER — Encounter: Payer: Self-pay | Admitting: Physician Assistant

## 2018-11-26 VITALS — BP 122/80 | HR 110 | Ht 70.0 in | Wt 234.1 lb

## 2018-11-26 DIAGNOSIS — C3491 Malignant neoplasm of unspecified part of right bronchus or lung: Secondary | ICD-10-CM | POA: Diagnosis not present

## 2018-11-26 DIAGNOSIS — R000 Tachycardia, unspecified: Secondary | ICD-10-CM

## 2018-11-26 DIAGNOSIS — I110 Hypertensive heart disease with heart failure: Secondary | ICD-10-CM | POA: Diagnosis not present

## 2018-11-26 DIAGNOSIS — I5032 Chronic diastolic (congestive) heart failure: Secondary | ICD-10-CM | POA: Diagnosis not present

## 2018-11-26 DIAGNOSIS — J90 Pleural effusion, not elsewhere classified: Secondary | ICD-10-CM

## 2018-11-26 MED ORDER — METOPROLOL SUCCINATE ER 25 MG PO TB24: 25.0000 mg | ORAL_TABLET | Freq: Every day | ORAL | 3 refills | Status: DC

## 2018-11-26 NOTE — Telephone Encounter (Signed)
Contacted patient per Dr. Irene Limbo to inform patient: will start on levothyroxine 25cmg po daily (sent to pharmacy). Patient is to f/u with PCP for care. Spoke with wife (on [place]) who verbalized understanding.

## 2018-11-26 NOTE — Patient Instructions (Signed)
Medication Instructions:  Your physician has recommended you make the following change in your medication:  1. START TOPROL XL 25 MG DAILY.  If you need a refill on your cardiac medications before your next appointment, please call your pharmacy.   Lab work: NONE  If you have labs (blood work) drawn today and your tests are completely normal, you will receive your results only by: [name] MyChart Message (if you have MyChart) OR . A paper copy in the mail If you have any lab test that is abnormal or we need to change your treatment, we will call you to review the results.  Testing/Procedures: NONE  Follow-Up: At [facility], you and your health needs are our priority.  As part of our continuing mission to provide you with exceptional heart care, we have created designated Provider Care Teams.  These Care Teams include your primary Cardiologist (physician) and Advanced Practice Providers (APPs -  Physician Assistants and Nurse Practitioners) who all work together to provide you with the care you need, when you need it. . You will need a follow up appointment in:  3 months. You may see Sherren Mocha, MD or Richardson Dopp, PA-C   Any Other Special Instructions Will Be Listed Below (If Applicable).

## 2018-12-01 ENCOUNTER — Other Ambulatory Visit: Payer: Self-pay | Admitting: Hematology

## 2018-12-01 MED ORDER — HYDROCODONE-ACETAMINOPHEN 5-325 MG PO TABS: ORAL_TABLET | ORAL | 0 refills | Status: DC

## 2018-12-23 ENCOUNTER — Ambulatory Visit: Payer: Medicare HMO | Admitting: Pulmonary Disease

## 2018-12-25 ENCOUNTER — Other Ambulatory Visit: Payer: Self-pay | Admitting: Hematology

## 2018-12-25 MED ORDER — HYDROCODONE-ACETAMINOPHEN 5-325 MG PO TABS: ORAL_TABLET | ORAL | 0 refills | Status: DC

## 2018-12-30 ENCOUNTER — Telehealth: Payer: Self-pay | Admitting: Hematology

## 2018-12-30 ENCOUNTER — Encounter: Payer: Self-pay | Admitting: Gastroenterology

## 2018-12-30 NOTE — Telephone Encounter (Signed)
Returned call to patient regarding rescheduling appointment. Per patient appointment moved from April to May. Message to provider.

## 2018-12-31 ENCOUNTER — Other Ambulatory Visit: Payer: Medicare HMO

## 2018-12-31 ENCOUNTER — Ambulatory Visit: Payer: Medicare HMO | Admitting: Hematology

## 2019-01-16 ENCOUNTER — Other Ambulatory Visit: Payer: Self-pay | Admitting: Hematology

## 2019-01-20 ENCOUNTER — Other Ambulatory Visit: Payer: Self-pay | Admitting: *Deleted

## 2019-01-20 NOTE — Telephone Encounter (Signed)
Wife called - requested refill of pain medication, needed by end of week

## 2019-01-21 MED ORDER — HYDROCODONE-ACETAMINOPHEN 5-325 MG PO TABS: ORAL_TABLET | ORAL | 0 refills | Status: DC

## 2019-01-29 NOTE — Progress Notes (Signed)
HEMATOLOGY/ONCOLOGY CLINIC NOTE  Date of Service: 01/30/2019  Patient Care Team: Bernerd Limbo, MD as PCP - General (Family Medicine) Sherren Mocha, MD as PCP - Cardiology (Cardiology)  CHIEF COMPLAINTS/PURPOSE OF CONSULTATION:  F/u for mx of lung cancer  HISTORY OF PRESENTING ILLNESS:   Barry Booker is a wonderful 73 y.o. male who has been referred to [place] by Dr Marshell Garfinkel for evaluation and management of presumed newly diagnosed rt sided lung cancer with concern for bronchial obstruction.  Patient has a history of hypertension, dyslipidemia, diabetes, COPD, alcohol abuse and previously had a chest x-ray on 02/20/2018 at [facility] which showed Right upper lobe consolidation/atelectasis may relate to post obstructive pneumonitis/pneumonia. An underlying obstructing lesion should be considered. Consider CT for further assessment.  H subsequently then had a CT of the chest on 02/27/2018 which showed -5.8 x 4.9 cm mass in the right mediastinum and hilum with associated severe narrowing of the right main pulmonary artery as well as right upper lobe and right middle lobe arteries. Obstructed right main stem bronchus and collapse of the right upper lobe and right middle lobe. Small right effusion. The appearance is similar to the patient's chest radiograph from 02/20/2018.  Patient subsequently had a PET CT on 03/10/2018 by Dr. Vaughan Browner which showed Large intensely hypermetabolic RIGHT hilar mass obstructs the RIGHT upper lobe bronchus and surrounds the bronchus intermedius. Post obstructive collapse of the RIGHT upper lobe.  No hypermetabolic mediastinal or supraclavicular lymph nodes. No evidence distant metastatic disease.  Patient had a flexible video fiberoptic bronchoscopy done on 03/21/2018 by Dr. Vaughan Browner which showed which showed some nodularity of the right mainstem bronchial mucosa with near complete occlusion of right main stem. Unable to traverse the occluded part of right main stem. The  mucosa was friable with propensity to bleed. Endobronchial biopsies and brushings were taken from the R mainstem and right upper lobe for pathology.  Preliminary results showed atypical cells however final cytology/pathology is currently pending.  Patient report rt sided chest pain with deep inspiration, anorexia and weight loss of about 30lbs in the last 6 months.  Interval History:   Barry Booker returns to clinic today for management and evaluation of his lung cancer, and C6 Durvalumab. The patient's last visit with [place] was on 11/19/18. The pt reports that he is doing well overall.  The pt reports that he is breathing "alright." He notes that his legs and feet "bother" him when he stands. He denies cramping, tingling or numbness. He describes his legs and feet as an achy/weak feeling. He has lost 9 pounds in the interim. He notes that he is taking his thyroid medicine every morning. He notes that he takes all of his medications each morning, including metformin. He notes that he is staying thirsty and denies leg swelling.  The pt notes that he has stopped caring about much, but denies feeling depressed as such. He notes that he has lost the taste for beer, and his last beer was 4 nights ago. He notes that he used to have concern for alcohol addiction which required treatment.   Lab results today (01/30/19) of CBC w/diff and CMP is as follows: all values are WNL except for RBC at 3.95, HGB at 11.9, HCT at 35.5, Sodium at 132, Potassium at 5.3, Glucose at 576, BUN at 25, Creatinine at 1.58, AST at 12, GFR at 43. 01/30/19 Thyroid Panel with TSH is pending  On review of systems, pt reports leg pain/weakness,  eating well, and denies mouth sores, leg swelling, abdominal pains, and any other symptoms.    MEDICAL HISTORY:  Past Medical History:  Diagnosis Date  . Alcohol abuse   . COPD (chronic obstructive pulmonary disease) ([place])   . Diabetes mellitus without complication ([place])   . Echocardiogram     Echo 11/2018: EF 45-50, mild MAC, mild calc of AoV (difficult study) >> Limited echo with Definity contrast 11/2018: EF 50-55  . Gout   . High cholesterol   . Hypertension   . Malignant neoplasm of right upper lobe of lung ([place]) 03/29/2018    SURGICAL HISTORY: Past Surgical History:  Procedure Laterality Date  . COLONOSCOPY    . FLEXIBLE BRONCHOSCOPY [name] 03/21/2018   Procedure: FLEXIBLE BRONCHOSCOPY;  Surgeon: Marshell Garfinkel, MD;  Location: [place];  Service: Pulmonary;  Laterality: [name];  . NOSE SURGERY  90's  . VIDEO BRONCHOSCOPY WITH ENDOBRONCHIAL ULTRASOUND [name] 03/21/2018   Procedure: VIDEO BRONCHOSCOPY WITH ENDOBRONCHIAL ULTRASOUND;  Surgeon: Marshell Garfinkel, MD;  Location: [facility];  Service: Pulmonary;  Laterality: [name];    SOCIAL HISTORY: Social History   Socioeconomic History  . Marital status: Married    Spouse name: Not on file  . Number of children: Not on file  . Years of education: Not on file  . Highest education level: Not on file  Occupational History  . Not on file  Social Needs  . Financial resource strain: Not on file  . Food insecurity:    Worry: Not on file    Inability: Not on file  . Transportation needs:    Medical: Not on file    Non-medical: Not on file  Tobacco Use  . Smoking status: Former Smoker    Packs/day: 0.25    Years: 50.00    Pack years: 12.50    Types: Cigarettes    Last attempt to quit: 07/2017    Years since quitting: 1.5  . Smokeless tobacco: Never Used  . Tobacco comment: quit smoking 07/2017  Substance and Sexual Activity  . Alcohol use: Yes    Alcohol/week: 14.0 - 21.0 standard drinks    Types: 14 - 21 Cans of beer per week  . Drug use: No  . Sexual activity: Not on file  Lifestyle  . Physical activity:    Days per week: Not on file    Minutes per session: Not on file  . Stress: Not on file  Relationships  . Social connections:    Talks on phone: Not on file    Gets together: Not on file    Attends religious service: Not  on file    Active member of club or organization: Not on file    Attends meetings of clubs or organizations: Not on file    Relationship status: Not on file  . Intimate partner violence:    Fear of current or ex partner: Not on file    Emotionally abused: Not on file    Physically abused: Not on file    Forced sexual activity: Not on file  Other Topics Concern  . Not on file  Social History Narrative  . Not on file    FAMILY HISTORY: Family History  Problem Relation Age of Onset  . Arthritis Mother   . Cancer Father   . Cancer Sister   . Heart disease Maternal Uncle   . Colon cancer Neg Hx     ALLERGIES:  is allergic to neomycin-bacitracin zn-polymyx.  MEDICATIONS:  Current Outpatient Medications  Medication Sig Dispense Refill  . albuterol (PROVENTIL HFA;VENTOLIN HFA) 108 (90 Base) MCG/ACT inhaler Inhale 1-2 puffs into the lungs every 4 (four) hours as needed for wheezing or shortness of breath. 1 Inhaler 2  . aspirin 81 MG tablet Take 81 mg by mouth daily.    [name] atorvastatin (LIPITOR) 40 MG tablet Take 40 mg by mouth daily.     . benazepril (LOTENSIN) 20 MG tablet Take 20 mg by mouth 2 (two) times daily.     . diphenhydrAMINE (BENADRYL) 12.5 MG/5ML elixir Take by mouth.    . folic acid (FOLVITE) 161 MCG tablet Take 400 mcg by mouth daily.    . furosemide (LASIX) 40 MG tablet Take 40 mg by mouth daily.     [name] HYDROcodone-acetaminophen (NORCO) 5-325 MG tablet 1/2 to 1 tablet 4 times daily as needed for pain. 60 tablet 0  . ibuprofen (ADVIL,MOTRIN) 200 MG tablet Take 400 mg by mouth every 6 (six) hours as needed for headache, mild pain or moderate pain.    [name] levothyroxine (SYNTHROID) 25 MCG tablet TAKE 1 TABLET BY MOUTH DAILY BEFORE BREAKFAST. 30 tablet 1  . metFORMIN (GLUCOPHAGE) 500 MG tablet Take 500 mg by mouth every morning.    . metoprolol succinate (TOPROL XL) 25 MG 24 hr tablet Take 1 tablet (25 mg total) by mouth daily. 90 tablet 3  . prochlorperazine (COMPAZINE) 10 MG  tablet TAKE 1 TABLET BY MOUTH EVERY 6 HOURS AS NEEDED FOR NAUSEA AND VOMITING     No current facility-administered medications for this visit.     REVIEW OF SYSTEMS:    A 10+ POINT REVIEW OF SYSTEMS WAS OBTAINED including neurology, dermatology, psychiatry, cardiac, respiratory, lymph, extremities, GI, GU, Musculoskeletal, constitutional, breasts, reproductive, HEENT.  All pertinent positives are noted in the HPI.  All others are negative.   PHYSICAL EXAMINATION: ECOG PERFORMANCE STATUS: 1 - Symptomatic but completely ambulatory  Vitals:   01/30/19 0917  BP: 135/85  Pulse: 100  Resp: 18  Temp: 99 F (37.2 C)  SpO2: 97%   Filed Weights   01/30/19 0917  Weight: 225 lb 8 oz (102.3 kg)   .Body mass index is 32.36 kg/m.  GENERAL:alert, in no acute distress and comfortable SKIN: no acute rashes, no significant lesions EYES: conjunctiva are pink and non-injected, sclera anicteric OROPHARYNX: MMM, no exudates, no oropharyngeal erythema or ulceration NECK: supple, no JVD LYMPH:  no palpable lymphadenopathy in the cervical, axillary or inguinal regions LUNGS: decreased air entry in the right base HEART: regular rate & rhythm ABDOMEN:  normoactive bowel sounds , non tender, not distended. No palpable hepatosplenomegaly.  Extremity: no pedal edema PSYCH: alert & oriented x 3 with fluent speech NEURO: no focal motor/sensory deficits   LABORATORY DATA:  I have reviewed the data as listed . CBC Latest Ref Rng & Units 01/30/2019 11/17/2018 10/03/2018  WBC 4.0 - 10.5 K/uL 9.2 7.5 9.4  Hemoglobin 13.0 - 17.0 g/dL 11.9(L) 13.0 13.7  Hematocrit 39.0 - 52.0 % 35.5(L) 39.9 43.4  Platelets 150 - 400 K/uL 160 156 153    . CMP Latest Ref Rng & Units 01/30/2019 11/17/2018 10/03/2018  Glucose 70 - 99 mg/dL 576(HH) 199(H) 160(H)  BUN 8 - 23 mg/dL 25(H) 23 14  Creatinine 0.61 - 1.24 mg/dL 1.58(H) 1.25(H) 0.89  Sodium 135 - 145 mmol/L 132(L) 141 140  Potassium 3.5 - 5.1 mmol/L 5.3(H) 4.2 4.3   Chloride 98 - 111 mmol/L 99 105 105  CO2 22 - 32 mmol/L  _0 Calcium 8.9 - 10.3 mg/dL 9.6 9.6 9.5  Total Protein 6.5 - 8.1 g/dL 7.9 7.9 7.6  Total Bilirubin 0.3 - 1.2 mg/dL 0.4 0.4 0.4  Alkaline Phos 38 - 126 U/L 83 67 70  AST 15 - 41 U/L 12(L) 17 15  ALT 0 - 44 U/L _1 03/21/18   03/25/18 Endobronchial bx:     RADIOGRAPHIC STUDIES: I have personally reviewed the radiological images as listed and agreed with the findings in the report. No results found.  ASSESSMENT & PLAN:   73 y.o.  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 Post obstructive collapse of the Right upper lobe. No hypermetabolic mediastinal or supraclavicular lymph nodes. No evidence of distant mets.  06/23/18 PET/CT revealed  Marked reduction in activity associated with the dominant right upper lobe central mass, maximum SUV 3.4 and previously 15.3. Please note that this lesion does still in case and markedly narrows the right upper lobe bronchus. 2. There is some patchy ground-glass opacities with small airspace opacity components in the right upper lobe, right middle lobe, right lower lobe, and lingula. Some of these are hypermetabolic, for example lingular opacity has a maximum SUV of 6.2 and the right lower lobe bandlike opacity 5.7. These are probably inflammatory and merit surveillance. 3. Small to moderate right pleural effusion with accentuated metabolic activity. Malignant effusion not excluded. 4. No findings of metastatic disease to the neck, abdomen/pelvis, or skeleton. 5. Aortic aneurysm NOS. Infrarenal aortic aneurysm 3.8  cm in diameter. Recommend followup by [place] in 2 years. 6. Other imaging findings of potential clinical significance: Chronic paranasal sinusitis. Aortic Atherosclerosis. Coronary atherosclerosis. Hepatic cirrhosis. Ventral hernia containing a loop of small bowel. Suspected late phase healing of the right anterior eighth rib.  09/18/18 CT Chest revealed Rarely relatively stable perihilar consolidation on the RIGHT consistent post treatment change. Persistent narrowing of the proximal bronchi. 2. Interval increase in interstitial thickening in the RIGHT upper lobe. Differential include postobstructive process versus lymphangitic carcinoma. Favor postobstructive process. 3. Interval increase in RIGHT pleural effusion. 4. With potential progressive malignant findings versus progressive benign change, consider follow-up FDG PET scan.   10/02/18 Cytology from thoracentesis showed no malignant cells. 10/02/18 CXR revealed No evidence of pneumothorax after RIGHT thoracentesis. 2. Persistent large, loculated RIGHT pleural effusion and associated dense passive atelectasis in the RIGHT LOWER LOBE. 3. No new abnormalities.  10/29/18 CT Chest revealed Interval decrease in right pleural effusion with residual loculated component seen anteriorly and medially. 2. The interstitial and airspace disease in the posterior right upper lung is similar to prior. 3. Stable volume loss right hemithorax with right parahilar interstitial and airspace disease likely reflecting radiation fibrosis. 4. Nodular liver morphology suggests cirrhosis.  2) RUL airway obstruction with RUL lung collapse. 3) Concern for severe narrowing of the right main pulmonary artery as well as right upper lobe and right middle lobe arteries.   4.  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 Creatinine increased to 1.58. Hyperkalemic. -Recommend complete, and immediate review and adjustment of medications with PCP. -Strongly recommend that pt wear a mask while in public as he is at higher risk -Will repeat CT Chest in next 5 weeks for further consideration of treatment, and pt will consider goals of care further in the interim -Pulmonology feels that right pleural effusion is not related to immunotherapy nor heart failure. Suspects effusion is malignant and that cytology may have missed this, and thoracentesis showed exudative fluid. -The pt prefers to continue holding immunotherapy during the next 1-2 months  -Reviewed the last available ECHO from 01/19/16 which did reveal grade 2 diastolic dysfunction, and indicates that the pt could be volume overloaded from that stand point as well -11/24/18 ECHO revealed LV EF of 50-55% -Continue follow up with Pulmonology and Cardiology -Limit salt intake -If fluid is malignant, will likely change course with intended treatment -Continue Albuterol  -Advised that the pt follow up with his PCP for management of his BP -Current plan to complete maintenance Durvalumab for up to one year -Again counseled the pt towards complete smoking cessation -Recommend Senna S for occasional constipation  -Discussed a plan to reduce and taper Norco use for right sided back and chest pain -Costal chondritis, recommend using a small pillow or bracing himself when coughing. Also recommend 4% lidocaine patch OTC -Will see the pt back in 6 weeks   CT chest wo contrast in 5 weeks RTC with Dr Irene Limbo with labs in 6 weeks   All of the patients questions were answered with apparent  satisfaction. The patient knows to call the clinic with any problems, questions or concerns.  The total time spent in the appt was 30 minutes and more than 50% was on counseling and direct patient cares.    Sullivan Lone MD [place] [facility] [place] Hematology/Oncology Physician [facility]  (Office):       [phone] (Work cell):  [phone] (Fax):           [phone]  I, Baldwin Jamaica, am acting as a scribe for Dr. Sullivan Lone.   .I have reviewed the above documentation for accuracy and completeness, and I agree with the above. Brunetta Genera MD

## 2019-01-30 ENCOUNTER — Inpatient Hospital Stay (HOSPITAL_BASED_OUTPATIENT_CLINIC_OR_DEPARTMENT_OTHER): Payer: Medicare HMO | Admitting: Hematology

## 2019-01-30 ENCOUNTER — Other Ambulatory Visit: Payer: Self-pay

## 2019-01-30 ENCOUNTER — Telehealth: Payer: Self-pay | Admitting: *Deleted

## 2019-01-30 ENCOUNTER — Inpatient Hospital Stay: Payer: Medicare HMO | Attending: Hematology

## 2019-01-30 ENCOUNTER — Emergency Department (HOSPITAL_COMMUNITY)
Admission: EM | Admit: 2019-01-30 | Discharge: 2019-01-30 | Disposition: A | Discharge status: Home / Self Care | Payer: Medicare HMO | Attending: Emergency Medicine | Admitting: Emergency Medicine

## 2019-01-30 ENCOUNTER — Telehealth: Payer: Self-pay | Admitting: Hematology

## 2019-01-30 ENCOUNTER — Emergency Department (HOSPITAL_COMMUNITY): Payer: Medicare HMO

## 2019-01-30 VITALS — BP 135/85 | HR 100 | Temp 99.0°F | Resp 18 | Ht 70.0 in | Wt 225.5 lb

## 2019-01-30 DIAGNOSIS — E1165 Type 2 diabetes mellitus with hyperglycemia: Secondary | ICD-10-CM | POA: Insufficient documentation

## 2019-01-30 DIAGNOSIS — R7989 Other specified abnormal findings of blood chemistry: Secondary | ICD-10-CM

## 2019-01-30 DIAGNOSIS — Z87891 Personal history of nicotine dependence: Secondary | ICD-10-CM | POA: Diagnosis not present

## 2019-01-30 DIAGNOSIS — C3491 Malignant neoplasm of unspecified part of right bronchus or lung: Secondary | ICD-10-CM

## 2019-01-30 DIAGNOSIS — R739 Hyperglycemia, unspecified: Secondary | ICD-10-CM

## 2019-01-30 DIAGNOSIS — Z7982 Long term (current) use of aspirin: Secondary | ICD-10-CM | POA: Insufficient documentation

## 2019-01-30 DIAGNOSIS — D0220 Carcinoma in situ of unspecified bronchus and lung: Secondary | ICD-10-CM | POA: Insufficient documentation

## 2019-01-30 DIAGNOSIS — E119 Type 2 diabetes mellitus without complications: Secondary | ICD-10-CM | POA: Diagnosis not present

## 2019-01-30 DIAGNOSIS — Z79899 Other long term (current) drug therapy: Secondary | ICD-10-CM | POA: Insufficient documentation

## 2019-01-30 DIAGNOSIS — R000 Tachycardia, unspecified: Secondary | ICD-10-CM | POA: Insufficient documentation

## 2019-01-30 DIAGNOSIS — I110 Hypertensive heart disease with heart failure: Secondary | ICD-10-CM | POA: Diagnosis not present

## 2019-01-30 DIAGNOSIS — I10 Essential (primary) hypertension: Secondary | ICD-10-CM | POA: Diagnosis not present

## 2019-01-30 DIAGNOSIS — I5032 Chronic diastolic (congestive) heart failure: Secondary | ICD-10-CM | POA: Diagnosis not present

## 2019-01-30 DIAGNOSIS — Z7984 Long term (current) use of oral hypoglycemic drugs: Secondary | ICD-10-CM | POA: Diagnosis not present

## 2019-01-30 DIAGNOSIS — J449 Chronic obstructive pulmonary disease, unspecified: Secondary | ICD-10-CM

## 2019-01-30 DIAGNOSIS — E785 Hyperlipidemia, unspecified: Secondary | ICD-10-CM

## 2019-01-30 DIAGNOSIS — J90 Pleural effusion, not elsewhere classified: Secondary | ICD-10-CM

## 2019-01-30 DIAGNOSIS — C3411 Malignant neoplasm of upper lobe, right bronchus or lung: Secondary | ICD-10-CM

## 2019-01-30 DIAGNOSIS — C3490 Malignant neoplasm of unspecified part of unspecified bronchus or lung: Secondary | ICD-10-CM

## 2019-01-30 LAB — CBC WITH DIFFERENTIAL/PLATELET
Abs Immature Granulocytes: 0.06 10*3/uL (ref 0.00–0.07)
Abs Immature Granulocytes: 0.07 10*3/uL (ref 0.00–0.07)
Basophils Absolute: 0 10*3/uL (ref 0.0–0.1)
Basophils Absolute: 0 10*3/uL (ref 0.0–0.1)
Basophils Relative: 0 %
Basophils Relative: 0 %
Eosinophils Absolute: 0.2 10*3/uL (ref 0.0–0.5)
Eosinophils Absolute: 0.1 10*3/uL (ref 0.0–0.5)
Eosinophils Relative: 2 %
Eosinophils Relative: 1 %
HCT: 35.5 % — ABNORMAL LOW (ref 39.0–52.0)
HCT: 35 % — ABNORMAL LOW (ref 39.0–52.0)
Hemoglobin: 11.9 g/dL — ABNORMAL LOW (ref 13.0–17.0)
Hemoglobin: 12 g/dL — ABNORMAL LOW (ref 13.0–17.0)
Immature Granulocytes: 1 %
Immature Granulocytes: 1 %
Lymphocytes Relative: 10 %
Lymphocytes Relative: 10 %
Lymphs Abs: 0.9 10*3/uL (ref 0.7–4.0)
Lymphs Abs: 1 10*3/uL (ref 0.7–4.0)
MCH: 30.1 pg (ref 26.0–34.0)
MCH: 31.3 pg (ref 26.0–34.0)
MCHC: 33.5 g/dL (ref 30.0–36.0)
MCHC: 34.3 g/dL (ref 30.0–36.0)
MCV: 89.9 fL (ref 80.0–100.0)
MCV: 91.1 fL (ref 80.0–100.0)
Monocytes Absolute: 0.8 10*3/uL (ref 0.1–1.0)
Monocytes Absolute: 0.8 10*3/uL (ref 0.1–1.0)
Monocytes Relative: 9 %
Monocytes Relative: 8 %
Neutro Abs: 7.2 10*3/uL (ref 1.7–7.7)
Neutro Abs: 8.3 10*3/uL — ABNORMAL HIGH (ref 1.7–7.7)
Neutrophils Relative %: 78 %
Neutrophils Relative %: 80 %
Platelets: 160 10*3/uL (ref 150–400)
Platelets: 161 10*3/uL (ref 150–400)
RBC: 3.95 MIL/uL — ABNORMAL LOW (ref 4.22–5.81)
RBC: 3.84 MIL/uL — ABNORMAL LOW (ref 4.22–5.81)
RDW: 13.5 % (ref 11.5–15.5)
RDW: 13.9 % (ref 11.5–15.5)
WBC: 9.2 10*3/uL (ref 4.0–10.5)
WBC: 10.3 10*3/uL (ref 4.0–10.5)
nRBC: 0 % (ref 0.0–0.2)
nRBC: 0 % (ref 0.0–0.2)

## 2019-01-30 LAB — BLOOD GAS, VENOUS
Acid-base deficit: 0.3 mmol/L (ref 0.0–2.0)
Bicarbonate: 24.4 mmol/L (ref 20.0–28.0)
O2 Saturation: 80.7 %
Patient temperature: 37
pCO2, Ven: 42.3 mmHg — ABNORMAL LOW (ref 44.0–60.0)
pH, Ven: 7.379 (ref 7.250–7.430)
pO2, Ven: 45.8 mmHg — ABNORMAL HIGH (ref 32.0–45.0)

## 2019-01-30 LAB — URINALYSIS, ROUTINE W REFLEX MICROSCOPIC
Bilirubin Urine: NEGATIVE
Glucose, UA: >=500 mg/dL — AB
Hgb urine dipstick: NEGATIVE
Ketones, ur: NEGATIVE mg/dL
Leukocytes,Ua: NEGATIVE
Nitrite: NEGATIVE
Protein, ur: NEGATIVE mg/dL
Specific Gravity, Urine: 1.013 (ref 1.005–1.030)
pH: 5 (ref 5.0–8.0)

## 2019-01-30 LAB — CMP (CANCER CENTER ONLY)
ALT: 14 U/L (ref 0–44)
AST: 12 U/L — ABNORMAL LOW (ref 15–41)
Albumin: 3.5 g/dL (ref 3.5–5.0)
Alkaline Phosphatase: 83 U/L (ref 38–126)
Anion gap: 9 (ref 5–15)
BUN: 25 mg/dL — ABNORMAL HIGH (ref 8–23)
CO2: 24 mmol/L (ref 22–32)
Calcium: 9.6 mg/dL (ref 8.9–10.3)
Chloride: 99 mmol/L (ref 98–111)
Creatinine: 1.58 mg/dL — ABNORMAL HIGH (ref 0.61–1.24)
GFR, Est AFR Am: 50 mL/min — ABNORMAL LOW (ref >60)
GFR, Estimated: 43 mL/min — ABNORMAL LOW (ref >60)
Glucose, Bld: 576 mg/dL (ref 70–99)
Potassium: 5.3 mmol/L — ABNORMAL HIGH (ref 3.5–5.1)
Sodium: 132 mmol/L — ABNORMAL LOW (ref 135–145)
Total Bilirubin: 0.4 mg/dL (ref 0.3–1.2)
Total Protein: 7.9 g/dL (ref 6.5–8.1)

## 2019-01-30 LAB — COMPREHENSIVE METABOLIC PANEL
ALT: 16 U/L (ref 0–44)
AST: 16 U/L (ref 15–41)
Albumin: 3.8 g/dL (ref 3.5–5.0)
Alkaline Phosphatase: 78 U/L (ref 38–126)
Anion gap: 11 (ref 5–15)
BUN: 28 mg/dL — ABNORMAL HIGH (ref 8–23)
CO2: 23 mmol/L (ref 22–32)
Calcium: 9.5 mg/dL (ref 8.9–10.3)
Chloride: 99 mmol/L (ref 98–111)
Creatinine, Ser: 1.3 mg/dL — ABNORMAL HIGH (ref 0.61–1.24)
GFR calc Af Amer: >60 mL/min (ref >60)
GFR calc non Af Amer: 55 mL/min — ABNORMAL LOW (ref >60)
Glucose, Bld: 504 mg/dL (ref 70–99)
Potassium: 4.9 mmol/L (ref 3.5–5.1)
Sodium: 133 mmol/L — ABNORMAL LOW (ref 135–145)
Total Bilirubin: 0.7 mg/dL (ref 0.3–1.2)
Total Protein: 8 g/dL (ref 6.5–8.1)

## 2019-01-30 LAB — CBG MONITORING, ED: Glucose-Capillary: 375 mg/dL — ABNORMAL HIGH (ref 70–99)

## 2019-01-30 MED ORDER — SODIUM CHLORIDE 0.9 % IV BOLUS
500.0000 mL | Freq: Once | INTRAVENOUS | Status: AC
  Administered 2019-01-30: 12:00:00 500 mL via INTRAVENOUS

## 2019-01-30 MED ORDER — INSULIN ASPART 100 UNIT/ML ~~LOC~~ SOLN
10.0000 [IU] | Freq: Once | SUBCUTANEOUS | Status: AC
  Administered 2019-01-30: 15:00:00 10 [IU] via SUBCUTANEOUS
  Filled 2019-01-30: qty 1

## 2019-01-30 NOTE — Telephone Encounter (Signed)
Scheduled appt per 5/1 los  A calendar will be mailed out.

## 2019-01-30 NOTE — ED Provider Notes (Signed)
[facility] DEPT Provider Note   CSN: [id number] Arrival date & time: 01/30/19  1055    History   Chief Complaint No chief complaint on file.   HPI Barry Booker is a 73 y.o. male.     The history is provided by the patient and medical records. No language interpreter was used.  Hyperglycemia  Blood sugar level PTA:  >500 Severity:  Severe Onset quality:  Gradual Timing:  Constant Progression:  Unchanged Chronicity:  New Diabetes status:  Controlled with oral medications Relieved by:  Nothing Ineffective treatments:  None tried Associated symptoms: polyuria   Associated symptoms: no abdominal pain, no altered mental status, no chest pain, no confusion, no diaphoresis, no dizziness, no dysuria, no fatigue, no fever, no increased appetite, no increased thirst, no malaise, no nausea, no shortness of breath and no vomiting     Past Medical History:  Diagnosis Date  . Alcohol abuse   . COPD (chronic obstructive pulmonary disease) ([place])   . Diabetes mellitus without complication ([place])   . Echocardiogram    Echo 11/2018: EF 45-50, mild MAC, mild calc of AoV (difficult study) >> Limited echo with Definity contrast 11/2018: EF 50-55  . Gout   . High cholesterol   . Hypertension   . Malignant neoplasm of right upper lobe of lung ([place]) 03/29/2018    Patient Active Problem List   Diagnosis Date Noted  . Squamous cell lung cancer, right ([place]) 04/04/2018  . Counseling regarding advance care planning and goals of care 04/04/2018  . Malignant neoplasm of right upper lobe of lung ([place]) 03/29/2018  . Anorexia nervosa   . Airway obstruction, anatomic   . Loss of weight   . Respiratory distress 03/21/2018  . Lung mass 03/14/2018  . Chronic diastolic CHF (congestive heart failure) ([place]) 02/09/2016  . History of pulmonary embolism 02/09/2016  . Fecal occult blood test positive 07/05/2015  . Local edema 06/28/2015  . Type 2 diabetes mellitus ([place])  01/07/2015  . Excessive urination at night 06/22/2014  . Encounter for screening for cardiovascular disorders 01/29/2014  . Adiposity 01/19/2014  . Chronic obstructive pulmonary disease ([place]) 07/07/2013  . Hypertensive heart disease 06/22/2013  . Disease of skin and subcutaneous tissue 12/25/2012  . Disease of nail 11/25/2012  . Encounter for screening for eye and ear disorders 07/29/2012  . Abnormal blood chemistry 12/10/2011  . 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 Nondependent alcohol abuse 06/05/2011  . Chronic nonalcoholic liver disease 35/00/9381  . Thrombocytopenia ([place]) 07/12/2010  . ABNORMAL ABDOMINAL ULTRASOUND 09/14/2008  . HLD (hyperlipidemia) 09/08/2007  . ALCOHOL ABUSE 02/25/2007  . TOBACCO ABUSE 02/25/2007  . INSOMNIA 02/25/2007  . DEPENDENCE, ALCOHOL NEC/NOS, IN REMISSION 02/19/2007  . HYPERTENSION 02/19/2007  . COPD 02/19/2007    Past Surgical History:  Procedure Laterality Date  . COLONOSCOPY    . FLEXIBLE BRONCHOSCOPY [name] 03/21/2018   Procedure: FLEXIBLE BRONCHOSCOPY;  Surgeon: Marshell Garfinkel, MD;  Location: [facility];  Service: Pulmonary;  Laterality: [name];  . NOSE SURGERY  90's  . VIDEO BRONCHOSCOPY WITH ENDOBRONCHIAL ULTRASOUND [name] 03/21/2018   Procedure: VIDEO BRONCHOSCOPY WITH ENDOBRONCHIAL ULTRASOUND;  Surgeon: Marshell Garfinkel, MD;  Location: [facility];  Service: Pulmonary;  Laterality: [name];        Home Medications    Prior to Admission medications   Medication Sig Start Date End Date Taking? Authorizing Provider  albuterol (PROVENTIL HFA;VENTOLIN HFA) 108 (90 Base) MCG/ACT inhaler Inhale 1-2 puffs into the lungs every 4 (  four) hours as needed for wheezing or shortness of breath. 09/19/18   Brunetta Genera, MD  aspirin 81 MG tablet Take 81 mg by mouth daily.    [provider]  atorvastatin (LIPITOR) 40 MG tablet Take 40 mg by mouth daily.  10/02/13   [provider]  benazepril (LOTENSIN) 20 MG tablet Take 20 mg by mouth 2  (two) times daily.  05/29/18   [provider]  diphenhydrAMINE (BENADRYL) 12.5 MG/5ML elixir Take by mouth.    [provider]  folic acid (FOLVITE) 751 MCG tablet Take 400 mcg by mouth daily.    [provider]  furosemide (LASIX) 40 MG tablet Take 40 mg by mouth daily.     [provider]  HYDROcodone-acetaminophen (NORCO) 5-325 MG tablet 1/2 to 1 tablet 4 times daily as needed for pain. 01/21/19   Brunetta Genera, MD  ibuprofen (ADVIL,MOTRIN) 200 MG tablet Take 400 mg by mouth every 6 (six) hours as needed for headache, mild pain or moderate pain.    [provider]  levothyroxine (SYNTHROID) 25 MCG tablet TAKE 1 TABLET BY MOUTH DAILY BEFORE BREAKFAST. 01/19/19   Brunetta Genera, MD  metFORMIN (GLUCOPHAGE) 500 MG tablet Take 500 mg by mouth every morning.    [provider]  metoprolol succinate (TOPROL XL) 25 MG 24 hr tablet Take 1 tablet (25 mg total) by mouth daily. 11/26/18   Richardson Dopp T, PA-C  prochlorperazine (COMPAZINE) 10 MG tablet TAKE 1 TABLET BY MOUTH EVERY 6 HOURS AS NEEDED FOR NAUSEA AND VOMITING 09/25/18   [provider]    Family History Family History  Problem Relation Age of Onset  . Arthritis Mother   . Cancer Father   . Cancer Sister   . Heart disease Maternal Uncle   . Colon cancer Neg Hx     Social History Social History   Tobacco Use  . Smoking status: Former Smoker    Packs/day: 0.25    Years: 50.00    Pack years: 12.50    Types: Cigarettes    Last attempt to quit: 07/2017    Years since quitting: 1.5  . Smokeless tobacco: Never Used  . Tobacco comment: quit smoking 07/2017  Substance Use Topics  . Alcohol use: Yes    Alcohol/week: 14.0 - 21.0 standard drinks    Types: 14 - 21 Cans of beer per week  . Drug use: No     Allergies   Neomycin-bacitracin zn-polymyx   Review of Systems Review of Systems  Constitutional: Negative for chills, diaphoresis, fatigue and fever.   HENT: Negative for congestion.   Eyes: Negative for visual disturbance.  Respiratory: Negative for cough, chest tightness, shortness of breath and wheezing.   Cardiovascular: Negative for chest pain, palpitations and leg swelling.  Gastrointestinal: Negative for abdominal pain, constipation, diarrhea, nausea and vomiting.  Endocrine: Positive for polyuria. Negative for polydipsia.  Genitourinary: Positive for frequency. Negative for decreased urine volume, dysuria and urgency.  Musculoskeletal: Negative for back pain, neck pain and neck stiffness.  Skin: Negative for rash and wound.  Neurological: Negative for dizziness, light-headedness and headaches.  Psychiatric/Behavioral: Negative for agitation and confusion.  All other systems reviewed and are negative.    Physical Exam Updated Vital Signs BP 116/85   Pulse 86   Temp 97.9 F (36.6 C) (Oral)   Resp 20   Ht _0  (1.778 m)   Wt 102.1 kg   SpO2 97%   BMI 32.28 kg/m  Physical Exam Vitals signs and nursing note reviewed.  Constitutional:      General: He is not in acute distress.    Appearance: He is well-developed. He is not ill-appearing, toxic-appearing or diaphoretic.  HENT:     Head: Normocephalic and atraumatic.     Nose: No congestion or rhinorrhea.     Mouth/Throat:     Mouth: Mucous membranes are dry.     Pharynx: No oropharyngeal exudate or posterior oropharyngeal erythema.  Eyes:     Conjunctiva/sclera: Conjunctivae normal.     Pupils: Pupils are equal, round, and reactive to light.  Neck:     Musculoskeletal: Neck supple. No muscular tenderness.  Cardiovascular:     Rate and Rhythm: Normal rate and regular rhythm.     Pulses: Normal pulses.     Heart sounds: No murmur.  Pulmonary:     Effort: Pulmonary effort is normal. No respiratory distress.     Breath sounds: Normal breath sounds. No wheezing, rhonchi or rales.  Chest:     Chest wall: No tenderness.  Abdominal:     General: Abdomen is flat.      Palpations: Abdomen is soft.     Tenderness: There is no abdominal tenderness.  Musculoskeletal:        General: No tenderness.     Right lower leg: No edema.     Left lower leg: No edema.  Skin:    General: Skin is warm and dry.     Capillary Refill: Capillary refill takes less than 2 seconds.  Neurological:     General: No focal deficit present.     Mental Status: He is alert.     Sensory: No sensory deficit.     Motor: No weakness.     Gait: Gait normal.  Psychiatric:        Mood and Affect: Mood normal.      ED Treatments / Results  Labs (all labs ordered are listed, but only abnormal results are displayed) Labs Reviewed  CBC WITH DIFFERENTIAL/PLATELET - Abnormal; Notable for the following components:      Result Value   RBC 3.84 (*)    Hemoglobin 12.0 (*)    HCT 35.0 (*)    Neutro Abs 8.3 (*)    All other components within normal limits  COMPREHENSIVE METABOLIC PANEL - Abnormal; Notable for the following components:   Sodium 133 (*)    Glucose, Bld 504 (*)    BUN 28 (*)    Creatinine, Ser 1.30 (*)    GFR calc non Af Amer 55 (*)    All other components within normal limits  URINALYSIS, ROUTINE W REFLEX MICROSCOPIC - Abnormal; Notable for the following components:   Color, Urine STRAW (*)    Glucose, UA >=500 (*)    Bacteria, UA RARE (*)    All other components within normal limits  BLOOD GAS, VENOUS - Abnormal; Notable for the following components:   pCO2, Ven 42.3 (*)    pO2, Ven 45.8 (*)    All other components within normal limits  CBG MONITORING, ED - Abnormal; Notable for the following components:   Glucose-Capillary 375 (*)    All other components within normal limits  URINE CULTURE    EKG None  Radiology Dg Chest 2 View  Result Date: 01/30/2019 CLINICAL DATA:  Hyperglycemia. Right lung carcinoma. EXAM: CHEST - 2 VIEW COMPARISON:  10/20/2018 FINDINGS: Heart size is stable. Right lung volume loss is stable. Small loculated right pleural effusion  and/or pleural thickening is stable. Central right perihilar soft tissue density is also unchanged. Hyperinflated left lung remains clear. IMPRESSION: No acute findings. Stable small loculated right pleural effusion and/or pleural thickening. Stable central right perihilar soft tissue density. Electronically Signed   By: Earle Gell M.D.   On: 01/30/2019 11:58    Procedures Procedures (including critical care time)  Medications Ordered in ED Medications  sodium chloride 0.9 % bolus 500 mL (0 mLs Intravenous Stopped 01/30/19 1531)  insulin aspart (novoLOG) injection 10 Units (10 Units Subcutaneous Given 01/30/19 1529)     Initial Impression / Assessment and Plan / ED Course  I have reviewed the triage vital signs and the nursing notes.  Pertinent labs & imaging results that were available during my care of the patient were reviewed by me and considered in my medical decision making (see chart for details).        Barry Booker is a 73 y.o. male with a past medical history significant for hypertension, hyperlipidemia, diabetes, prior pulmonary embolism, mild CHF, and recent diagnosis of lung cancer who presents from cancer center for hyperglycemia.  Patient reports that recently he has had increase in urine frequency but otherwise has been feeling normal.  He denies any palpitations, chest pain, shortness of breath.  Denies any leg pain or leg swelling.  Denies any abdominal pain.  No nausea vomiting, constipation, or diarrhea.  He called his PCP after he was found to hyperglycemic and they told him to come to the emergency department for evaluation.  He otherwise denies fevers, chills, or significant fatigue.  On exam, lungs are clear and chest is nontender.  No murmur.  Abdomen is nontender.  Back is nontender.  CVA is nontender.  Legs are nonedematous and not swollen.  Patient has normal sensation and strength in all extremities.  Patient is otherwise well-appearing.  Dry mucous membranes.   Clinically I suspect patient's hyperglycemia is causing him to have polyuria and possible slight dehydration with mild tachycardia.  Will have labs to look for dried imbalance.  As legs did not have rales and legs were not swollen, do not feel patient significant fluid overloaded with his increase in urine.  Patient given some amount of fluids to help with his hyperglycemia and mild tachycardia.  Will get labs to rule out DKA.  Given lack of other symptoms, if occult infection is not discovered, and if glucoses improved, anticipate discharge home for outpatient follow-up.  Patient's glucoses have improved to 375.  Suspect he will continue to decline after his insulin.  We did not find evidence of occult infection.  Patient continues to have absolutely no symptoms.  His hyperkalemia was ruled out and was normal.  We did not find pneumonia.  Given his lack of symptoms and improving glucose and lack of other abnormalities, we feel he is safe for discharge home.  Patient will call her PCP to discuss further glucose management changes.  He denies any other questions or concerns and was discharged in good condition with improving glucose.   Final Clinical Impressions(s) / ED Diagnoses   Final diagnoses:  Hyperglycemia    ED Discharge Orders    None      Clinical Impression: 1. Hyperglycemia     Disposition: Discharge  Condition: Good  I have discussed the results, Dx and Tx plan with the pt(& family if present). He/she/they expressed understanding and agree(s) with the plan. Discharge instructions discussed at great length. Strict return precautions discussed and pt &/  or family have verbalized understanding of the instructions. No further questions at time of discharge.    New Prescriptions   No medications on file    Follow Up: Bernerd Limbo, MD [address] [place] [address] [facility] DEPT [place] [id number] mc [address] [place]       Tegeler, Gwenyth Allegra, MD 01/30/19 [phone]

## 2019-01-30 NOTE — ED Notes (Signed)
Bed: WA20 Expected date:  Expected time:  Means of arrival:  Comments: Cancer center-hyperglycemia

## 2019-01-30 NOTE — ED Triage Notes (Signed)
[phone] (wife patsy) Pt came in f/u visit.  Pt not in active tx.  Pt CBG is 567, K+ 5.3, and decreased kidney function.  pts pcp recommended pt be sent to ED.

## 2019-01-30 NOTE — Discharge Instructions (Signed)
Your work-up today showed elevated glucose which we treated in the emergency department fluid and insulin.  We also look for causes of this such as hidden infection.  We do not see evidence of urinary tract infection or pneumonia.  Your other labs were improved.  They sent you over for possible elevated potassium which had resolved.  Please call to follow-up with your primary doctor as they may need to adjust your glucose medications.  Please stay hydrated.  If any symptoms change or worsen, please return to the nearest emergency department.

## 2019-01-30 NOTE — Progress Notes (Signed)
Contacted Dr. Coletta Memos (PCP) office at Dr. Grier Mitts request to see if patient can be seen today d/t elevated bld glucose and K+. Per Elmyra Ricks, Dr. Coletta Memos not in office today, providers there reccommended patient go to ED for treatment. Dr. Irene Limbo informed and in agreement.  Patient taken to [facility] ED to room 20. Report given to Montel Clock RN.  Wife called with above information

## 2019-01-30 NOTE — ED Notes (Signed)
Coming from cancer center, states CBG over 500 and elevated potassium

## 2019-01-30 NOTE — Telephone Encounter (Signed)
Received call report from [facility].  "Today's Glucose = 567."  Message sent with results and called.  Scheduled provider F/U today.

## 2019-01-30 NOTE — Telephone Encounter (Signed)
Contacted Dr. Coletta Memos office at Dr. Grier Mitts request to see if patient can be seen today d/t elevated bld glucose and K+. Per Elmyra Ricks, Dr. Coletta Memos not in office today, providers there reccommended patient go to ED for treatment. Dr. Irene Limbo informed and in agreement.  Patient taken to [facility] ED to room 20. Report given to Montel Clock RN.  Contacted patient wife Tessie Fass [phone] (number given by patient) to update. Wife verbalized understanding.States she will keep phone on and wait for call from ED.

## 2019-01-30 NOTE — Patient Instructions (Addendum)
Call Dr. Coletta Memos today for uncontrolled blood sugars with Glucose in 500-600 range and for issues with hyperkalemia and causing decreased kidney function.  Discuss medication management with PCP, with ace inhibitors and fluid pills CT Chest in 5 weeks and RTC with Dr Irene Limbo with labs in 6 weeks Discuss goals of care with Patsy Goal to control fluid management and DM

## 2019-01-31 LAB — URINE CULTURE: Culture: NO GROWTH

## 2019-01-31 LAB — THYROID PANEL WITH TSH
Free Thyroxine Index: 1.7 (ref 1.2–4.9)
T3 Uptake Ratio: 29 % (ref 24–39)
T4, Total: 6 ug/dL (ref 4.5–12.0)
TSH: 14.39 u[IU]/mL — ABNORMAL HIGH (ref 0.450–4.500)

## 2019-01-31 LAB — CBG MONITORING, ED: Glucose-Capillary: 412 mg/dL — ABNORMAL HIGH (ref 70–99)

## 2019-02-11 ENCOUNTER — Encounter: Payer: Self-pay | Admitting: Podiatry

## 2019-02-11 ENCOUNTER — Other Ambulatory Visit: Payer: Self-pay

## 2019-02-11 ENCOUNTER — Ambulatory Visit: Payer: Medicare HMO | Admitting: Podiatry

## 2019-02-11 VITALS — Temp 97.3°F

## 2019-02-11 DIAGNOSIS — M79676 Pain in unspecified toe(s): Secondary | ICD-10-CM

## 2019-02-11 DIAGNOSIS — B351 Tinea unguium: Secondary | ICD-10-CM

## 2019-02-11 DIAGNOSIS — E119 Type 2 diabetes mellitus without complications: Secondary | ICD-10-CM | POA: Diagnosis not present

## 2019-02-11 DIAGNOSIS — M19079 Primary osteoarthritis, unspecified ankle and foot: Secondary | ICD-10-CM

## 2019-02-11 NOTE — Progress Notes (Signed)
Patient ID: Barry Booker, male   DOB: 03/23/1946, 73 y.o.   MRN: [id number] Complaint:  Visit Type: Patient returns to my office for continued preventative foot care services. Complaint: Patient states" my nails have grown long and thick and become painful to walk and wear shoes" . The patient presents for preventative foot care services. No changes to ROS  Podiatric Exam: Vascular: dorsalis pedis and posterior tibial pulses are palpable bilateral. Capillary return is immediate. Temperature gradient is WNL. Skin turgor WNL  Sensorium: Normal Semmes Weinstein monofilament test. Normal tactile sensation bilaterally. Nail Exam: Pt has thick disfigured discolored nails with subungual debris noted bilateral entire nail hallux through fifth toenails Ulcer Exam: There is no evidence of ulcer or pre-ulcerative changes or infection. Orthopedic Exam: Muscle tone and strength are WNL. No limitations in general ROM. No crepitus or effusions noted. Foot type and digits show no abnormalities. Bony prominences are unremarkable. Skin: No Porokeratosis. No infection or ulcers  Diagnosis:  Onychomycosis, , Pain in right toe, pain in left toes  Treatment & Plan Procedures and Treatment: Consent by patient was obtained for treatment procedures. The patient understood the discussion of treatment and procedures well. All questions were answered thoroughly reviewed. Debridement of mycotic and hypertrophic toenails, 1 through 5 bilateral and clearing of subungual debris. No ulceration, no infection noted.  Return Visit-Office Procedure: Patient instructed to return to the office for a follow up visit 3 months for continued evaluation and treatment.  Gardiner Barefoot DPM

## 2019-02-14 ENCOUNTER — Other Ambulatory Visit: Payer: Self-pay | Admitting: Hematology

## 2019-02-20 ENCOUNTER — Telehealth: Payer: Self-pay | Admitting: *Deleted

## 2019-02-20 NOTE — Telephone Encounter (Signed)
SPOKE WITH WIFE WHO WANTS TO R./S IN OFFICE VISIT IN June   PT WAS TOLD THEY'LL BE CONTACT BACK TO MAKE APPT.

## 2019-02-24 ENCOUNTER — Ambulatory Visit: Payer: Medicare HMO | Admitting: Physician Assistant

## 2019-02-26 ENCOUNTER — Other Ambulatory Visit: Payer: Self-pay | Admitting: *Deleted

## 2019-02-26 MED ORDER — HYDROCODONE-ACETAMINOPHEN 5-325 MG PO TABS: ORAL_TABLET | ORAL | 0 refills | Status: DC

## 2019-02-26 NOTE — Telephone Encounter (Signed)
Wife called - requested refill of Mr. Klingberg pain medication

## 2019-03-06 ENCOUNTER — Other Ambulatory Visit: Payer: Self-pay

## 2019-03-06 ENCOUNTER — Ambulatory Visit (HOSPITAL_COMMUNITY)
Admission: RE | Admit: 2019-03-06 | Discharge: 2019-03-06 | Disposition: A | Discharge status: Home / Self Care | Payer: Medicare HMO | Source: Ambulatory Visit | Attending: Hematology | Admitting: Hematology

## 2019-03-06 ENCOUNTER — Encounter (HOSPITAL_COMMUNITY): Payer: Self-pay

## 2019-03-06 DIAGNOSIS — C3490 Malignant neoplasm of unspecified part of unspecified bronchus or lung: Secondary | ICD-10-CM | POA: Diagnosis present

## 2019-03-12 ENCOUNTER — Other Ambulatory Visit: Payer: Self-pay | Admitting: Hematology

## 2019-03-12 NOTE — Progress Notes (Signed)
HEMATOLOGY/ONCOLOGY CLINIC NOTE  Date of Service: 03/13/2019  Patient Care Team: Bernerd Limbo, MD as PCP - General (Family Medicine) Sherren Mocha, MD as PCP - Cardiology (Cardiology)  CHIEF COMPLAINTS/PURPOSE OF CONSULTATION:  F/u for mx of lung cancer  HISTORY OF PRESENTING ILLNESS:   Barry Booker is a wonderful 73 y.o. male who has been referred to [place] by Dr Marshell Garfinkel for evaluation and management of presumed newly diagnosed rt sided lung cancer with concern for bronchial obstruction.  Patient has a history of hypertension, dyslipidemia, diabetes, COPD, alcohol abuse and previously had a chest x-ray on 02/20/2018 at [facility] which showed Right upper lobe consolidation/atelectasis may relate to post obstructive pneumonitis/pneumonia. An underlying obstructing lesion should be considered. Consider CT for further assessment.  H subsequently then had a CT of the chest on 02/27/2018 which showed -5.8 x 4.9 cm mass in the right mediastinum and hilum with associated severe narrowing of the right main pulmonary artery as well as right upper lobe and right middle lobe arteries. Obstructed right main stem bronchus and collapse of the right upper lobe and right middle lobe. Small right effusion. The appearance is similar to the patient's chest radiograph from 02/20/2018.  Patient subsequently had a PET CT on 03/10/2018 by Dr. Vaughan Browner which showed Large intensely hypermetabolic RIGHT hilar mass obstructs the RIGHT upper lobe bronchus and surrounds the bronchus intermedius. Post obstructive collapse of the RIGHT upper lobe.  No hypermetabolic mediastinal or supraclavicular lymph nodes. No evidence distant metastatic disease.  Patient had a flexible video fiberoptic bronchoscopy done on 03/21/2018 by Dr. Vaughan Browner which showed which showed some nodularity of the right mainstem bronchial mucosa with near complete occlusion of right main stem. Unable to traverse the occluded part of right main stem. The  mucosa was friable with propensity to bleed. Endobronchial biopsies and brushings were taken from the R mainstem and right upper lobe for pathology.  Preliminary results showed atypical cells however final cytology/pathology is currently pending.  Patient report rt sided chest pain with deep inspiration, anorexia and weight loss of about 30lbs in the last 6 months.  Interval History:   Barry Booker returns to clinic today for management and evaluation of his lung cancer. The patient's last visit with [place] was on 01/30/19. The pt reports that he is doing well overall. He is accompanied today by his partner Patsy, via Cell phone.  The pt reports that his partner has been checking his blood sugars twice a day. He notes that he is taking Metformin and Insulin now. The pt's partner notes that his blood sugars have been much better controlled.   The pt notes that he has not needed to take his pain medications in a while, except for one instance a few nights ago when he developed some chest pain which resolved after using his pain medication. He denies any repeated episodes of chest pain and endorses stable breathing overall.  The pt notes that he has been eating well and has had stable weight. He notes that he can walk more than he does, and notes he doesn't walk "because he's getting lazier every day." He notes that he would be open to walking more.   The pt notes that he has been staying away from crowds and denies any concerns for infections at this time.  Of note since the patient's last visit, pt has had a CT Chest completed on 03/06/19 with results revealing "Stable appearing extensive radiation changes and treated tumor  in the right upper lobe and right paramediastinal lung and right hilum. I do not see any definite CT findings for progressive disease. 2. Stable loculated right pleural fluid collections. 3. No CT findings to suggest metastatic pulmonary nodules in the left lung or metastatic disease  involving the upper abdomen. 4. Stable cirrhotic changes involving the liver. Aortic Atherosclerosis and Emphysema."  Lab results today (03/13/19) of CBC w/diff and CMP is as follows: all values are WNL except for RBC at 4.02, HGB at 12.0, HCT at 38.4, Glucose at 172, AST at 14.  On review of systems, pt reports stable energy levels, low activity, stable breathing,  and denies concerns for infections, leg swelling, abdominal pains, CP, SOB, and any other symptoms.   MEDICAL HISTORY:  Past Medical History:  Diagnosis Date   Alcohol abuse    COPD (chronic obstructive pulmonary disease) ([place])    Diabetes mellitus without complication (HCC)    Echocardiogram    Echo 11/2018: EF 45-50, mild MAC, mild calc of AoV (difficult study) >> Limited echo with Definity contrast 11/2018: EF 50-55   Gout    High cholesterol    Hypertension    Malignant neoplasm of right upper lobe of lung ([place]) 03/29/2018    SURGICAL HISTORY: Past Surgical History:  Procedure Laterality Date   COLONOSCOPY     FLEXIBLE BRONCHOSCOPY [name] 03/21/2018   Procedure: FLEXIBLE BRONCHOSCOPY;  Surgeon: Marshell Garfinkel, MD;  Location: [place];  Service: Pulmonary;  Laterality: [name];   NOSE SURGERY  90's   VIDEO BRONCHOSCOPY WITH ENDOBRONCHIAL ULTRASOUND [name] 03/21/2018   Procedure: VIDEO BRONCHOSCOPY WITH ENDOBRONCHIAL ULTRASOUND;  Surgeon: Marshell Garfinkel, MD;  Location: [place];  Service: Pulmonary;  Laterality: [name];    SOCIAL HISTORY: Social History   Socioeconomic History   Marital status: Married    Spouse name: Not on file   Number of children: Not on file   Years of education: Not on file   Highest education level: Not on file  Occupational History   Not on file  Social Needs   Financial resource strain: Not on file   Food insecurity    Worry: Not on file    Inability: Not on file   Transportation needs    Medical: Not on file    Non-medical: Not on file  Tobacco Use   Smoking status: Former  Smoker    Packs/day: 0.25    Years: 50.00    Pack years: 12.50    Types: Cigarettes    Quit date: 07/2017    Years since quitting: 1.6   Smokeless tobacco: Never Used   Tobacco comment: quit smoking 07/2017  Substance and Sexual Activity   Alcohol use: Yes    Alcohol/week: 14.0 - 21.0 standard drinks    Types: 14 - 21 Cans of beer per week   Drug use: No   Sexual activity: Not on file  Lifestyle   Physical activity    Days per week: Not on file    Minutes per session: Not on file   Stress: Not on file  Relationships   Social connections    Talks on phone: Not on file    Gets together: Not on file    Attends religious service: Not on file    Active member of club or organization: Not on file    Attends meetings of clubs or organizations: Not on file    Relationship status: Not on file   Intimate partner violence    Fear of current  or ex partner: Not on file    Emotionally abused: Not on file    Physically abused: Not on file    Forced sexual activity: Not on file  Other Topics Concern   Not on file  Social History Narrative   Not on file    FAMILY HISTORY: Family History  Problem Relation Age of Onset   Arthritis Mother    Cancer Father    Cancer Sister    Heart disease Maternal Uncle    Colon cancer Neg Hx     ALLERGIES:  is allergic to neomycin-bacitracin zn-polymyx.  MEDICATIONS:  Current Outpatient Medications  Medication Sig Dispense Refill   albuterol (PROVENTIL HFA;VENTOLIN HFA) 108 (90 Base) MCG/ACT inhaler Inhale 1-2 puffs into the lungs every 4 (four) hours as needed for wheezing or shortness of breath. 1 Inhaler 2   ANORO ELLIPTA 62.5-25 MCG/INH AEPB Inhale 1 puff into the lungs daily.     aspirin 81 MG tablet Take 81 mg by mouth daily.     atorvastatin (LIPITOR) 40 MG tablet Take 40 mg by mouth daily.      benazepril (LOTENSIN) 20 MG tablet Take 20 mg by mouth daily.      diphenhydrAMINE (BENADRYL) 12.5 MG/5ML elixir Take  12.5 mg by mouth daily as needed for allergies.      folic acid (FOLVITE) 389 MCG tablet Take 400 mcg by mouth daily.     furosemide (LASIX) 40 MG tablet Take 40 mg by mouth daily.      HYDROcodone-acetaminophen (NORCO) 5-325 MG tablet 1/2 to 1 tablet 4 times daily as needed for pain. 60 tablet 0   ibuprofen (ADVIL,MOTRIN) 200 MG tablet Take 400 mg by mouth every 6 (six) hours as needed for headache, mild pain or moderate pain.     levothyroxine (SYNTHROID) 25 MCG tablet TAKE 1 TABLET BY MOUTH DAILY BEFORE BREAKFAST. 30 tablet 1   metFORMIN (GLUCOPHAGE) 500 MG tablet Take 500 mg by mouth every morning.     metoprolol succinate (TOPROL XL) 25 MG 24 hr tablet Take 1 tablet (25 mg total) by mouth daily. 90 tablet 3   potassium chloride SA (K-DUR) 20 MEQ tablet Take 20 mEq by mouth daily.     prochlorperazine (COMPAZINE) 10 MG tablet Take 10 mg by mouth every 6 (six) hours as needed for nausea or vomiting.      No current facility-administered medications for this visit.     REVIEW OF SYSTEMS:    A 10+ POINT REVIEW OF SYSTEMS WAS OBTAINED including neurology, dermatology, psychiatry, cardiac, respiratory, lymph, extremities, GI, GU, Musculoskeletal, constitutional, breasts, reproductive, HEENT.  All pertinent positives are noted in the HPI.  All others are negative.   PHYSICAL EXAMINATION: ECOG PERFORMANCE STATUS: 1 - Symptomatic but completely ambulatory  Vitals:   03/13/19 1048  BP: 127/87  Pulse: (!) 114  Resp: (!) 81  Temp: 98 F (36.7 C)  SpO2: 99%   Filed Weights   03/13/19 1048  Weight: 222 lb 14.4 oz (101.1 kg)   .Body mass index is 31.98 kg/m.  GENERAL:alert, in no acute distress and comfortable SKIN: no acute rashes, no significant lesions EYES: conjunctiva are pink and non-injected, sclera anicteric OROPHARYNX: MMM, no exudates, no oropharyngeal erythema or ulceration NECK: supple, no JVD LYMPH:  no palpable lymphadenopathy in the cervical, axillary or  inguinal regions LUNGS: few scattered rhonchi, no rales HEART: regular rate & rhythm ABDOMEN:  normoactive bowel sounds , non tender, not distended. No palpable hepatosplenomegaly.  Extremity:  no pedal edema PSYCH: alert & oriented x 3 with fluent speech NEURO: no focal motor/sensory deficits   LABORATORY DATA:  I have reviewed the data as listed . CBC Latest Ref Rng & Units 03/13/2019 01/30/2019 01/30/2019  WBC 4.0 - 10.5 K/uL 9.5 10.3 9.2  Hemoglobin 13.0 - 17.0 g/dL 12.0(L) 12.0(L) 11.9(L)  Hematocrit 39.0 - 52.0 % 38.4(L) 35.0(L) 35.5(L)  Platelets 150 - 400 K/uL 178 161 160    . CMP Latest Ref Rng & Units 03/13/2019 01/30/2019 01/30/2019  Glucose 70 - 99 mg/dL 172(H) 504(HH) 576(HH)  BUN 8 - 23 mg/dL 19 28(H) 25(H)  Creatinine 0.61 - 1.24 mg/dL 1.20 1.30(H) 1.58(H)  Sodium 135 - 145 mmol/L 140 133(L) 132(L)  Potassium 3.5 - 5.1 mmol/L 4.4 4.9 5.3(H)  Chloride 98 - 111 mmol/L 103 99 99  CO2 22 - 32 mmol/L _0 Calcium 8.9 - 10.3 mg/dL 9.8 9.5 9.6  Total Protein 6.5 - 8.1 g/dL 8.1 8.0 7.9  Total Bilirubin 0.3 - 1.2 mg/dL 0.3 0.7 0.4  Alkaline Phos 38 - 126 U/L 67 78 83  AST 15 - 41 U/L 14(L) 16 12(L)  ALT 0 - 44 U/L _1 03/21/18   03/25/18 Endobronchial bx:     RADIOGRAPHIC STUDIES: I have personally reviewed the radiological images as listed and agreed with the findings in the report. Ct Chest Wo Contrast  Result Date: 03/06/2019 CLINICAL DATA:  Restaging lung cancer. Immunotherapy is currently on hold. Chronic shortness of breath. EXAM: CT CHEST WITHOUT CONTRAST TECHNIQUE: Multidetector CT imaging of the chest was performed following the standard protocol without IV contrast. COMPARISON:  Chest CT 10/29/2018 and 09/18/2018.  PET-CT 06/23/2018 FINDINGS: Cardiovascular: The heart is normal in size. No pericardial effusion. Prominent pericardial and epicardial fat. Stable tortuosity, ectasia and calcification of the thoracic aorta and stable coronary artery  calcifications. Mediastinum/Nodes: Stable soft tissue density in the right hilum and surrounding the right mainstem bronchus appears stable. This is likely radiation change/treated tumor. I do not see any new or progressive findings. No enlarged mediastinal lymph nodes. Stable scattered nodes. The esophagus is grossly normal. Lungs/Pleura: Persistent extensive airspace consolidation involving the right paramediastinal lung and right upper lobe posteriorly. This is likely treated tumor and radiation. The adjacent moderate-sized right pleural effusion with probable loculation also appears stable. I do not see any new or progressive findings. A few small scattered nodules in the right lower lobe chest above the effusion appears stable. No findings suspicious for metastatic pulmonary nodules involving the left lung. Upper Abdomen: No findings suspicious for upper abdominal metastatic disease. Stable cirrhotic changes involving the liver and stable vascular calcifications. Musculoskeletal: No chest wall mass, supraclavicular or axillary adenopathy. The bony structures are intact. No findings suspicious for osseous metastatic disease. IMPRESSION: 1. Stable appearing extensive radiation changes and treated tumor in the right upper lobe and right paramediastinal lung and right hilum. I do not see any definite CT findings for progressive disease. 2. Stable loculated right pleural fluid collections. 3. No CT findings to suggest metastatic pulmonary nodules in the left lung or metastatic disease involving the upper abdomen. 4. Stable cirrhotic changes involving the liver. Aortic Atherosclerosis (ICD10-I70.0) and Emphysema (ICD10-J43.9). Electronically Signed   By: Marijo Sanes M.D.   On: 03/06/2019 11:51    ASSESSMENT & PLAN:   73 y.o.  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 Post obstructive collapse of the Right upper lobe. No hypermetabolic mediastinal or supraclavicular lymph nodes. No evidence of distant mets.  06/23/18 PET/CT revealed  Marked reduction in activity associated with the dominant right upper lobe central mass, maximum SUV 3.4 and previously 15.3. Please note that this lesion does still in case and markedly narrows the right upper lobe bronchus. 2. There is some patchy ground-glass opacities with small airspace opacity components in the right upper lobe, right middle lobe, right lower lobe, and lingula. Some of these are hypermetabolic, for example lingular opacity has a maximum SUV of 6.2 and the right lower lobe bandlike opacity 5.7. These are probably inflammatory and merit surveillance. 3. Small to moderate right pleural effusion with accentuated metabolic activity. Malignant effusion not excluded. 4. No findings of metastatic disease to the neck, abdomen/pelvis, or skeleton. 5. Aortic aneurysm NOS. Infrarenal aortic aneurysm 3.8 cm in diameter. Recommend followup by [place] in 2 years. 6. Other imaging findings of potential clinical significance: Chronic paranasal sinusitis. Aortic Atherosclerosis. Coronary atherosclerosis. Hepatic cirrhosis. Ventral hernia containing a loop of small bowel. Suspected late phase healing of the right anterior eighth rib.  09/18/18 CT Chest revealed Rarely relatively stable perihilar consolidation on the RIGHT consistent post treatment change. Persistent narrowing of the proximal bronchi. 2. Interval increase in interstitial thickening in the RIGHT upper lobe. Differential include postobstructive process versus lymphangitic carcinoma. Favor postobstructive process. 3. Interval increase in RIGHT  pleural effusion. 4. With potential progressive malignant findings versus progressive benign change, consider follow-up FDG PET scan.   10/02/18 Cytology from thoracentesis showed no malignant cells. 10/02/18 CXR revealed No evidence of pneumothorax after RIGHT thoracentesis. 2. Persistent large, loculated RIGHT pleural effusion and associated dense passive atelectasis in the RIGHT LOWER LOBE. 3. No new abnormalities.  10/29/18 CT Chest revealed Interval decrease in right pleural effusion with residual loculated component seen anteriorly and medially. 2. The interstitial and airspace disease in the posterior right upper lung is similar to prior. 3. Stable volume loss right hemithorax with right parahilar interstitial and airspace disease likely reflecting radiation fibrosis. 4. Nodular liver morphology suggests cirrhosis.  S/p 5 cycles of Durvalumab  2) RUL airway obstruction with RUL lung collapse. 3) Concern for severe narrowing of the right main pulmonary artery as well as right upper lobe and right middle lobe arteries.   4.  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  PLAN:  -Discussed pt labwork today, 03/13/19; blood counts are stable, chemistries are much improved with Glucose today at 172 (was in 500s at last visit) -Discussed the 03/06/19 CT Chest which revealed "Stable appearing extensive radiation changes and treated tumor in the right upper lobe and right paramediastinal lung and right hilum. I do not see any definite CT findings for progressive disease. 2. Stable loculated right  pleural fluid collections. 3. No CT findings to suggest metastatic pulmonary  nodules in the left lung or metastatic disease involving the upper abdomen. 4. Stable cirrhotic changes involving the liver. Aortic Atherosclerosis and Emphysema." -Discussed that as the patient's tumor has not been seen to have progression, and as his pleural fluid collection is stable, this is reassuring -Discussed that we could either choose to return to maintenance immunotherapy vs watchful observation. The pt understands the risks and benefits of each, and he prefers watchful observation at this time. -Will repeat scan in 4 months, sooner if any new symptoms  -Strongly recommend that pt wear a mask while in public as he is at higher risk -Pulmonology previously felt that right pleural effusion is not related to immunotherapy nor heart failure. Suspects effusion is malignant and that cytology may have missed this, and thoracentesis showed exudative fluid. Remains stable. -Reviewed the last available ECHO from 01/19/16 which did reveal grade 2 diastolic dysfunction, and indicates that the pt could be volume overloaded from that stand point as well -11/24/18 ECHO revealed LV EF of 50-55% -Continue follow up with Pulmonology and Cardiology -Limit salt intake -Continue Albuterol  -Advised that the pt follow up with his PCP for management of his BP -Again counseled the pt towards complete smoking cessation -Recommend Senna S for occasional constipation  -Discussed a plan to reduce and taper Norco use for right sided back and chest pain -Will see the pt back in 2 months   RTC with Dr Irene Limbo in 2 months   All of the patients questions were answered with apparent satisfaction. The patient knows to call the clinic with any problems, questions or concerns.  The total time spent in the appt was 25 minutes and more than 50% was on counseling and direct patient cares.    Sullivan Lone MD [place] AAHIVMS [facility] Hematology/Oncology Physician [facility]  (Office):       [phone] (Work  cell):  [phone] (Fax):           [phone]  I, Baldwin [place], am acting as a scribe for Dr. Sullivan Lone.   .I have reviewed the above documentation for accuracy and completeness, and I agree with the above. Brunetta Genera MD

## 2019-03-13 ENCOUNTER — Inpatient Hospital Stay (HOSPITAL_BASED_OUTPATIENT_CLINIC_OR_DEPARTMENT_OTHER): Payer: Medicare HMO | Admitting: Hematology

## 2019-03-13 ENCOUNTER — Inpatient Hospital Stay: Payer: Medicare HMO | Attending: Hematology

## 2019-03-13 ENCOUNTER — Other Ambulatory Visit: Payer: Self-pay

## 2019-03-13 VITALS — BP 127/87 | HR 114 | Temp 98.0°F | Resp 81 | Ht 70.0 in | Wt 222.9 lb

## 2019-03-13 DIAGNOSIS — J90 Pleural effusion, not elsewhere classified: Secondary | ICD-10-CM

## 2019-03-13 DIAGNOSIS — C3491 Malignant neoplasm of unspecified part of right bronchus or lung: Secondary | ICD-10-CM

## 2019-03-13 DIAGNOSIS — E039 Hypothyroidism, unspecified: Secondary | ICD-10-CM | POA: Diagnosis not present

## 2019-03-13 DIAGNOSIS — C3401 Malignant neoplasm of right main bronchus: Secondary | ICD-10-CM

## 2019-03-13 LAB — CMP (CANCER CENTER ONLY)
ALT: 13 U/L (ref 0–44)
AST: 14 U/L — ABNORMAL LOW (ref 15–41)
Albumin: 3.6 g/dL (ref 3.5–5.0)
Alkaline Phosphatase: 67 U/L (ref 38–126)
Anion gap: 10 (ref 5–15)
BUN: 19 mg/dL (ref 8–23)
CO2: 27 mmol/L (ref 22–32)
Calcium: 9.8 mg/dL (ref 8.9–10.3)
Chloride: 103 mmol/L (ref 98–111)
Creatinine: 1.2 mg/dL (ref 0.61–1.24)
GFR, Est AFR Am: >60 mL/min (ref >60)
GFR, Estimated: >60 mL/min (ref >60)
Glucose, Bld: 172 mg/dL — ABNORMAL HIGH (ref 70–99)
Potassium: 4.4 mmol/L (ref 3.5–5.1)
Sodium: 140 mmol/L (ref 135–145)
Total Bilirubin: 0.3 mg/dL (ref 0.3–1.2)
Total Protein: 8.1 g/dL (ref 6.5–8.1)

## 2019-03-13 LAB — CBC WITH DIFFERENTIAL/PLATELET
Abs Immature Granulocytes: 0.06 10*3/uL (ref 0.00–0.07)
Basophils Absolute: 0 10*3/uL (ref 0.0–0.1)
Basophils Relative: 0 %
Eosinophils Absolute: 0.2 10*3/uL (ref 0.0–0.5)
Eosinophils Relative: 2 %
HCT: 38.4 % — ABNORMAL LOW (ref 39.0–52.0)
Hemoglobin: 12 g/dL — ABNORMAL LOW (ref 13.0–17.0)
Immature Granulocytes: 1 %
Lymphocytes Relative: 9 %
Lymphs Abs: 0.8 10*3/uL (ref 0.7–4.0)
MCH: 29.9 pg (ref 26.0–34.0)
MCHC: 31.3 g/dL (ref 30.0–36.0)
MCV: 95.5 fL (ref 80.0–100.0)
Monocytes Absolute: 0.8 10*3/uL (ref 0.1–1.0)
Monocytes Relative: 8 %
Neutro Abs: 7.6 10*3/uL (ref 1.7–7.7)
Neutrophils Relative %: 80 %
Platelets: 178 10*3/uL (ref 150–400)
RBC: 4.02 MIL/uL — ABNORMAL LOW (ref 4.22–5.81)
RDW: 13.1 % (ref 11.5–15.5)
WBC: 9.5 10*3/uL (ref 4.0–10.5)
nRBC: 0 % (ref 0.0–0.2)

## 2019-03-16 ENCOUNTER — Telehealth: Payer: Self-pay | Admitting: Hematology

## 2019-03-16 NOTE — Telephone Encounter (Signed)
Scheduled appt per 6/12 los. Spoke with patient wife and she is aware of appt date and time.

## 2019-04-08 ENCOUNTER — Other Ambulatory Visit: Payer: Self-pay | Admitting: *Deleted

## 2019-04-08 MED ORDER — HYDROCODONE-ACETAMINOPHEN 5-325 MG PO TABS: ORAL_TABLET | ORAL | 0 refills | Status: DC

## 2019-04-08 NOTE — Telephone Encounter (Signed)
Patient requested refill of pain medication

## 2019-04-12 ENCOUNTER — Other Ambulatory Visit: Payer: Self-pay | Admitting: Hematology

## 2019-04-23 ENCOUNTER — Other Ambulatory Visit: Payer: Self-pay | Admitting: Pulmonary Disease

## 2019-05-11 ENCOUNTER — Telehealth: Payer: Self-pay | Admitting: *Deleted

## 2019-05-11 ENCOUNTER — Other Ambulatory Visit: Payer: Self-pay | Admitting: *Deleted

## 2019-05-11 NOTE — Telephone Encounter (Signed)
Wife called - Patient had labs done 8/5 at PCP. Copy sent to Dr. Grier Mitts office.  Wife wants to know if husband needs to come in this Wednesday for appt or can he have phone appt? Question sent to Dr. Irene Limbo.

## 2019-05-11 NOTE — Telephone Encounter (Signed)
Patient wife called to request refill of pain med

## 2019-05-12 MED ORDER — HYDROCODONE-ACETAMINOPHEN 5-325 MG PO TABS: ORAL_TABLET | ORAL | 0 refills | Status: DC

## 2019-05-12 NOTE — Telephone Encounter (Signed)
Patient does not need labs at [facility] on Wednesday. Per Dr. Irene Limbo, can have phone appt, but patient has decided to come to office.

## 2019-05-12 NOTE — Progress Notes (Signed)
HEMATOLOGY/ONCOLOGY CLINIC NOTE  Date of Service: 05/12/2019  Patient Care Team: Bernerd Limbo, MD as PCP - General (Family Medicine) Sherren Mocha, MD as PCP - Cardiology (Cardiology)  CHIEF COMPLAINTS/PURPOSE OF CONSULTATION:  F/u for mx of lung cancer  HISTORY OF PRESENTING ILLNESS:   Barry Booker is a wonderful 73 y.o. male who has been referred to [place] by Dr Marshell Garfinkel for evaluation and management of presumed newly diagnosed rt sided lung cancer with concern for bronchial obstruction.  Patient has a history of hypertension, dyslipidemia, diabetes, COPD, alcohol abuse and previously had a chest x-ray on 02/20/2018 at [facility] which showed Right upper lobe consolidation/atelectasis may relate to post obstructive pneumonitis/pneumonia. An underlying obstructing lesion should be considered. Consider CT for further assessment.  H subsequently then had a CT of the chest on 02/27/2018 which showed -5.8 x 4.9 cm mass in the right mediastinum and hilum with associated severe narrowing of the right main pulmonary artery as well as right upper lobe and right middle lobe arteries. Obstructed right main stem bronchus and collapse of the right upper lobe and right middle lobe. Small right effusion. The appearance is similar to the patient's chest radiograph from 02/20/2018.  Patient subsequently had a PET CT on 03/10/2018 by Dr. Vaughan Browner which showed Large intensely hypermetabolic RIGHT hilar mass obstructs the RIGHT upper lobe bronchus and surrounds the bronchus intermedius. Post obstructive collapse of the RIGHT upper lobe.  No hypermetabolic mediastinal or supraclavicular lymph nodes. No evidence distant metastatic disease.  Patient had a flexible video fiberoptic bronchoscopy done on 03/21/2018 by Dr. Vaughan Browner which showed which showed some nodularity of the right mainstem bronchial mucosa with near complete occlusion of right main stem. Unable to traverse the occluded part of right main stem. The  mucosa was friable with propensity to bleed. Endobronchial biopsies and brushings were taken from the R mainstem and right upper lobe for pathology.  Preliminary results showed atypical cells however final cytology/pathology is currently pending.  Patient report rt sided chest pain with deep inspiration, anorexia and weight loss of about 30lbs in the last 6 months.  Interval History:   Barry Booker returns to clinic today for management and evaluation of his lung cancer. The patient's last visit with [place] was on 03/13/2019. The pt reports that he is doing well overall.  The pt reports some fatigue. He has been eating well. His breathing has been fine because he has not has to physically exert himself recently. His chest wall has improved but still comes on every now and again.   The pt has been checking his blood sugars BID and they have been well controlled at home. He is still taking a fluid pill, which has improved his leg swelling but causes him to urinate frequently. He has not been staying physically active because he "doesn't feel like it." He still smokes cigarettes -- some days he does not smoke at all, some days he smokes 3-4.  Most recent lab results from 05/06/2019 of CBC w/diff and CMP is as follows: all values are WNL except for % lymph at 12.9, % gran at 80.5, grans abs at 7.6k, RBC at 4.29, HGB at 13.0, HCT at 39.3, glucose at 131 05/06/2019 TSH at 9.030 05/06/2019 Lipid panel revealed TAG at 193, HDL at 29, LDL at 127, LDL/HDL ratio at 4.4  On review of systems, pt reports fatigue, frequent urination, eating well, improved and denies leg swelling and any other symptoms.   MEDICAL  HISTORY:  Past Medical History:  Diagnosis Date  . Alcohol abuse   . COPD (chronic obstructive pulmonary disease) ([place])   . Diabetes mellitus without complication ([place])   . Echocardiogram    Echo 11/2018: EF 45-50, mild MAC, mild calc of AoV (difficult study) >> Limited echo with Definity contrast  11/2018: EF 50-55  . Gout   . High cholesterol   . Hypertension   . Malignant neoplasm of right upper lobe of lung ([place]) 03/29/2018    SURGICAL HISTORY: Past Surgical History:  Procedure Laterality Date  . COLONOSCOPY    . FLEXIBLE BRONCHOSCOPY [name] 03/21/2018   Procedure: FLEXIBLE BRONCHOSCOPY;  Surgeon: Marshell Garfinkel, MD;  Location: [place];  Service: Pulmonary;  Laterality: [name];  . NOSE SURGERY  90's  . VIDEO BRONCHOSCOPY WITH ENDOBRONCHIAL ULTRASOUND [name] 03/21/2018   Procedure: VIDEO BRONCHOSCOPY WITH ENDOBRONCHIAL ULTRASOUND;  Surgeon: Marshell Garfinkel, MD;  Location: [place];  Service: Pulmonary;  Laterality: [name];    SOCIAL HISTORY: Social History   Socioeconomic History  . Marital status: Married    Spouse name: Not on file  . Number of children: Not on file  . Years of education: Not on file  . Highest education level: Not on file  Occupational History  . Not on file  Social Needs  . Financial resource strain: Not on file  . Food insecurity    Worry: Not on file    Inability: Not on file  . Transportation needs    Medical: Not on file    Non-medical: Not on file  Tobacco Use  . Smoking status: Former Smoker    Packs/day: 0.25    Years: 50.00    Pack years: 12.50    Types: Cigarettes    Quit date: 07/2017    Years since quitting: 1.8  . Smokeless tobacco: Never Used  . Tobacco comment: quit smoking 07/2017  Substance and Sexual Activity  . Alcohol use: Yes    Alcohol/week: 14.0 - 21.0 standard drinks    Types: 14 - 21 Cans of beer per week  . Drug use: No  . Sexual activity: Not on file  Lifestyle  . Physical activity    Days per week: Not on file    Minutes per session: Not on file  . Stress: Not on file  Relationships  . Social Herbalist on phone: Not on file    Gets together: Not on file    Attends religious service: Not on file    Active member of club or organization: Not on file    Attends meetings of clubs or organizations: Not on file     Relationship status: Not on file  . Intimate partner violence    Fear of current or ex partner: Not on file    Emotionally abused: Not on file    Physically abused: Not on file    Forced sexual activity: Not on file  Other Topics Concern  . Not on file  Social History Narrative  . Not on file    FAMILY HISTORY: Family History  Problem Relation Age of Onset  . Arthritis Mother   . Cancer Father   . Cancer Sister   . Heart disease Maternal Uncle   . Colon cancer Neg Hx     ALLERGIES:  is allergic to neomycin-bacitracin zn-polymyx.  MEDICATIONS:  Current Outpatient Medications  Medication Sig Dispense Refill  . albuterol (PROVENTIL HFA;VENTOLIN HFA) 108 (90 Base) MCG/ACT inhaler Inhale 1-2 puffs into the  lungs every 4 (four) hours as needed for wheezing or shortness of breath. 1 Inhaler 2  . ANORO ELLIPTA 62.5-25 MCG/INH AEPB INHALE 1 PUFF INTO THE LUNGS DAILY 60 each 1  . aspirin 81 MG tablet Take 81 mg by mouth daily.    [name] atorvastatin (LIPITOR) 40 MG tablet Take 40 mg by mouth daily.     . benazepril (LOTENSIN) 20 MG tablet Take 20 mg by mouth daily.     . diphenhydrAMINE (BENADRYL) 12.5 MG/5ML elixir Take 12.5 mg by mouth daily as needed for allergies.     . folic acid (FOLVITE) 161 MCG tablet Take 400 mcg by mouth daily.    . furosemide (LASIX) 40 MG tablet Take 40 mg by mouth daily.     [name] HYDROcodone-acetaminophen (NORCO) 5-325 MG tablet 1/2 to 1 tablet 4 times daily as needed for pain. 60 tablet 0  . ibuprofen (ADVIL,MOTRIN) 200 MG tablet Take 400 mg by mouth every 6 (six) hours as needed for headache, mild pain or moderate pain.    [name] levothyroxine (SYNTHROID) 25 MCG tablet TAKE 1 TABLET BY MOUTH DAILY BEFORE BREAKFAST. 30 tablet 0  . metFORMIN (GLUCOPHAGE) 500 MG tablet Take 500 mg by mouth every morning.    . metoprolol succinate (TOPROL XL) 25 MG 24 hr tablet Take 1 tablet (25 mg total) by mouth daily. 90 tablet 3  . potassium chloride SA (K-DUR) 20 MEQ tablet Take  20 mEq by mouth daily.    . prochlorperazine (COMPAZINE) 10 MG tablet Take 10 mg by mouth every 6 (six) hours as needed for nausea or vomiting.      No current facility-administered medications for this visit.     REVIEW OF SYSTEMS:    A 10+ POINT REVIEW OF SYSTEMS WAS OBTAINED including neurology, dermatology, psychiatry, cardiac, respiratory, lymph, extremities, GI, GU, Musculoskeletal, constitutional, breasts, reproductive, HEENT.  All pertinent positives are noted in the HPI.  All others are negative.   PHYSICAL EXAMINATION: ECOG PERFORMANCE STATUS: 1 - Symptomatic but completely ambulatory  Vitals:   05/13/19 1224  BP: 124/85  Pulse: (!) 106  Resp: 18  Temp: 98 F (36.7 C)  SpO2: 98%   Filed Weights   05/13/19 1224  Weight: 235 lb 6.4 oz (106.8 kg)   .Body mass index is 33.78 kg/m.   GENERAL:alert, in no acute distress and comfortable SKIN: no acute rashes, no significant lesions EYES: conjunctiva are pink and non-injected, sclera anicteric OROPHARYNX: MMM, no exudates, no oropharyngeal erythema or ulceration NECK: supple, no JVD LYMPH:  no palpable lymphadenopathy in the cervical, axillary or inguinal regions LUNGS: clear to auscultation b/l with normal respiratory effort HEART: Fluid at right base 1/4 of lung, few scattered rhonchi ABDOMEN:  normoactive bowel sounds , non tender, not distended. No palpable hepatosplenomegaly.  Extremity: no pedal edema PSYCH: alert & oriented x 3 with fluent speech NEURO: no focal motor/sensory deficits   LABORATORY DATA:  I have reviewed the data as listed . CBC Latest Ref Rng & Units 03/13/2019 01/30/2019 01/30/2019  WBC 4.0 - 10.5 K/uL 9.5 10.3 9.2  Hemoglobin 13.0 - 17.0 g/dL 12.0(L) 12.0(L) 11.9(L)  Hematocrit 39.0 - 52.0 % 38.4(L) 35.0(L) 35.5(L)  Platelets 150 - 400 K/uL 178 161 160    . CMP Latest Ref Rng & Units 03/13/2019 01/30/2019 01/30/2019  Glucose 70 - 99 mg/dL 172(H) 504(HH) 576(HH)  BUN 8 - 23 mg/dL 19 28(H) 25(H)   Creatinine 0.61 - 1.24 mg/dL 1.20 1.30(H) 1.58(H)  Sodium 135 -  145 mmol/L 140 133(L) 132(L)  Potassium 3.5 - 5.1 mmol/L 4.4 4.9 5.3(H)  Chloride 98 - 111 mmol/L 103 99 99  CO2 22 - 32 mmol/L _0 Calcium 8.9 - 10.3 mg/dL 9.8 9.5 9.6  Total Protein 6.5 - 8.1 g/dL 8.1 8.0 7.9  Total Bilirubin 0.3 - 1.2 mg/dL 0.3 0.7 0.4  Alkaline Phos 38 - 126 U/L 67 78 83  AST 15 - 41 U/L 14(L) 16 12(L)  ALT 0 - 44 U/L _1 03/21/18   03/25/18 Endobronchial bx:     RADIOGRAPHIC STUDIES: I have personally reviewed the radiological images as listed and agreed with the findings in the report. No results found.  ASSESSMENT & PLAN:   73 y.o.  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 Post obstructive collapse of the Right upper lobe. No hypermetabolic mediastinal or supraclavicular lymph nodes. No evidence of distant mets.  06/23/18 PET/CT revealed  Marked reduction in activity associated with the dominant right upper lobe central mass, maximum SUV 3.4 and previously 15.3. Please note that this lesion does still in case and markedly narrows the right upper lobe bronchus. 2. There is some patchy ground-glass opacities with small airspace opacity components in the right upper lobe, right middle lobe, right lower lobe, and lingula. Some of these are hypermetabolic, for example lingular opacity has a maximum SUV of 6.2 and the right lower lobe bandlike opacity 5.7. These are probably inflammatory and merit surveillance. 3. Small to moderate right pleural effusion with accentuated metabolic activity. Malignant effusion not excluded. 4. No findings of  metastatic disease to the neck, abdomen/pelvis, or skeleton. 5. Aortic aneurysm NOS. Infrarenal aortic aneurysm 3.8 cm in diameter. Recommend followup by [place] in 2 years. 6. Other imaging findings of potential clinical significance: Chronic paranasal sinusitis. Aortic Atherosclerosis. Coronary atherosclerosis. Hepatic cirrhosis. Ventral hernia containing a loop of small bowel. Suspected late phase healing of the right anterior eighth rib.   09/18/18 CT Chest revealed Rarely relatively stable perihilar consolidation on the RIGHT consistent post treatment change. Persistent narrowing of the proximal bronchi. 2. Interval increase in interstitial thickening in the RIGHT upper lobe. Differential include postobstructive process versus lymphangitic carcinoma. Favor postobstructive process. 3. Interval increase in RIGHT pleural effusion. 4. With potential progressive malignant findings versus progressive benign change, consider follow-up FDG PET scan.   10/02/18 Cytology from thoracentesis showed no malignant cells. 10/02/18 CXR revealed No evidence of pneumothorax after RIGHT thoracentesis. 2. Persistent large, loculated RIGHT pleural effusion and associated dense passive atelectasis in the RIGHT LOWER LOBE. 3. No new abnormalities.  10/29/18 CT Chest revealed Interval decrease in right pleural effusion with residual loculated component seen anteriorly and medially. 2. The interstitial and airspace disease in the posterior right upper lung is similar to prior. 3. Stable volume loss right hemithorax with right parahilar interstitial and airspace disease likely reflecting radiation fibrosis. 4. Nodular liver morphology suggests cirrhosis.  S/p 5 cycles of Durvalumab  03/06/19 CT Chest revealed "Stable appearing extensive radiation changes and treated tumor in the right upper lobe and right paramediastinal lung and right hilum. I do not see any definite CT findings for progressive disease. 2. Stable loculated right pleural  fluid collections. 3. No CT findings to suggest metastatic pulmonary nodules in the left lung or metastatic disease involving the upper abdomen. 4. Stable cirrhotic changes involving the liver. Aortic Atherosclerosis and Emphysema."  11/24/18 ECHO revealed LV EF of 50-55%  2) RUL airway obstruction with RUL lung collapse. 3) Concern for severe narrowing of the right main pulmonary artery as well as right upper lobe and right middle lobe arteries.   4.  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  PLAN:  -Discussed most recent lab work from 05/06/2019; kidney numbers are improving, Creatinine has improved, blood sugar has improved significantly to 131 -Discussed that as the patient's tumor has not been seen to have progression, and as his pleural fluid collection is stable, this is reassuring -Discussed that we could either choose to return to maintenance immunotherapy vs watchful observation. The pt understands the risks and benefits of each, and he prefers watchful observation at this time. Diabetes and fluid status are better at this time.  -Pulmonology previously felt that right pleural effusion is not related to immunotherapy nor heart failure. Suspects effusion is malignant and that cytology may have missed this, and thoracentesis showed exudative fluid. Remains stable. -Reviewed the last available ECHO from 01/19/16 which did reveal grade 2 diastolic dysfunction, and indicates that the pt could be volume overloaded from that stand point as well -Continue Albuterol -Recommend decreasing salt intake -Recommended that the pt continue to eat well, drink at least  48-64 oz of water each day, and walk 20-30 minutes each day.  -Continue Norco prn -Will see the pt back in 12 weeks, with CT chest one week prior   Ct chest in 11 weeks RTC with Dr Irene Limbo with labs in 12 weeks   All of the patients questions were answered with apparent satisfaction. The patient knows to call the clinic with any problems, questions or concerns.  The total time spent in the appt was 25 minutes and more than 50% was on counseling and direct patient cares.  Sullivan Lone MD [place] AAHIVMS [facility] [place] Hematology/Oncology Physician [facility]  (Office):       [phone] (Work cell):  [phone] (Fax):           [phone]  I, De Burrs, am acting as a scribe for Dr. Irene Limbo  .I have reviewed the above documentation for accuracy and completeness, and I agree with the above. Brunetta Genera MD

## 2019-05-13 ENCOUNTER — Other Ambulatory Visit: Payer: Self-pay

## 2019-05-13 ENCOUNTER — Inpatient Hospital Stay: Payer: Medicare HMO | Attending: Hematology | Admitting: Hematology

## 2019-05-13 ENCOUNTER — Inpatient Hospital Stay: Payer: Medicare HMO

## 2019-05-13 VITALS — BP 124/85 | HR 106 | Temp 98.0°F | Resp 18 | Ht 70.0 in | Wt 235.4 lb

## 2019-05-13 DIAGNOSIS — C3491 Malignant neoplasm of unspecified part of right bronchus or lung: Secondary | ICD-10-CM

## 2019-05-13 DIAGNOSIS — E039 Hypothyroidism, unspecified: Secondary | ICD-10-CM | POA: Insufficient documentation

## 2019-05-13 DIAGNOSIS — C3401 Malignant neoplasm of right main bronchus: Secondary | ICD-10-CM | POA: Diagnosis not present

## 2019-05-14 ENCOUNTER — Telehealth: Payer: Self-pay | Admitting: Hematology

## 2019-05-14 NOTE — Telephone Encounter (Signed)
Scheduled appt per 8/12 los.  Spoke with patient wife and shs is aware of the appt date and time.

## 2019-05-16 ENCOUNTER — Other Ambulatory Visit: Payer: Self-pay | Admitting: Hematology

## 2019-05-20 ENCOUNTER — Encounter: Payer: Self-pay | Admitting: Podiatry

## 2019-05-20 ENCOUNTER — Other Ambulatory Visit: Payer: Self-pay

## 2019-05-20 ENCOUNTER — Ambulatory Visit: Payer: Medicare HMO | Admitting: Podiatry

## 2019-05-20 VITALS — Temp 97.5°F

## 2019-05-20 DIAGNOSIS — M79676 Pain in unspecified toe(s): Secondary | ICD-10-CM | POA: Diagnosis not present

## 2019-05-20 DIAGNOSIS — B351 Tinea unguium: Secondary | ICD-10-CM

## 2019-05-20 DIAGNOSIS — E119 Type 2 diabetes mellitus without complications: Secondary | ICD-10-CM

## 2019-05-20 NOTE — Progress Notes (Signed)
Patient ID: Barry Booker, male   DOB: 03/23/1946, 73 y.o.   MRN: [id number] Complaint:  Visit Type: Patient returns to my office for continued preventative foot care services. Complaint: Patient states" my nails have grown long and thick and become painful to walk and wear shoes" . The patient presents for preventative foot care services. No changes to ROS  Podiatric Exam: Vascular: dorsalis pedis and posterior tibial pulses are palpable bilateral. Capillary return is immediate. Temperature gradient is WNL. Skin turgor WNL  Sensorium: Normal Semmes Weinstein monofilament test. Normal tactile sensation bilaterally. Nail Exam: Pt has thick disfigured discolored nails with subungual debris noted bilateral entire nail hallux through fifth toenails Ulcer Exam: There is no evidence of ulcer or pre-ulcerative changes or infection. Orthopedic Exam: Muscle tone and strength are WNL. No limitations in general ROM. No crepitus or effusions noted. Foot type and digits show no abnormalities. Bony prominences are unremarkable. Skin: No Porokeratosis. No infection or ulcers  Diagnosis:  Onychomycosis, , Pain in right toe, pain in left toes  Treatment & Plan Procedures and Treatment: Consent by patient was obtained for treatment procedures. The patient understood the discussion of treatment and procedures well. All questions were answered thoroughly reviewed. Debridement of mycotic and hypertrophic toenails, 1 through 5 bilateral and clearing of subungual debris. No ulceration, no infection noted.  Return Visit-Office Procedure: Patient instructed to return to the office for a follow up visit 3 months for continued evaluation and treatment.  Gardiner Barefoot DPM

## 2019-05-27 ENCOUNTER — Other Ambulatory Visit: Payer: Self-pay | Admitting: Hematology

## 2019-05-29 NOTE — Telephone Encounter (Signed)
Please advise 90-day refill.  Next scheduled F/U: 08-05-2019

## 2019-06-10 IMAGING — MR MR HEAD WO/W CM
11 of 13 series · 38 of 48 positions shown · IV contrast (multihance)
Comparison: None.

CLINICAL DATA: Small-cell lung cancer staging.

EXAM:
MRI HEAD WITHOUT AND WITH CONTRAST
TECHNIQUE: Multiplanar, multiecho pulse sequences of the brain and surrounding
structures were obtained without and with intravenous contrast.
CONTRAST:  20mL MULTIHANCE GADOBENATE DIMEGLUMINE 529 MG/ML IV SOLN

[Series 5: ax dwi_tracew · axial · 3.0mm · 1.50mm/px · z∈[-62,+74]mm · 7 of 80 slices shown]
[im 1/80]
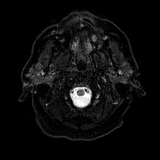
[im 14/80]
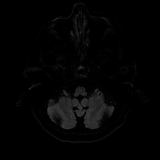
[im 27/80]
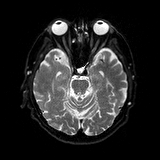
[im 40/80]
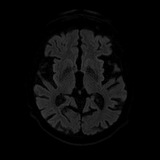
[im 53/80]
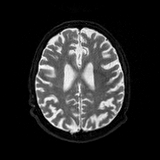
[im 66/80]
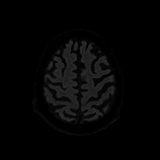
[im 80/80]
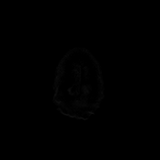

[Series 6: ax dwi_adc · axial · 3.0mm · 1.50mm/px · z∈[-62,+74]mm · 3 of 40 slices shown]
[im 1/40]
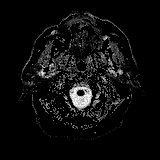
[im 20/40]
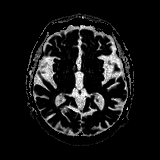
[im 40/40]
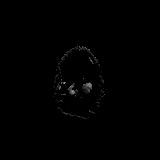

[Series 7: cor dwi_tracew · coronal · 5.0mm · 1.44mm/px · 6 of 72 slices shown]
[im 1/72]
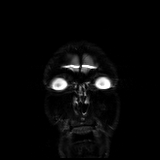
[im 15/72]
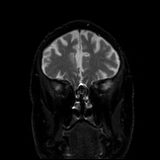
[im 29/72]
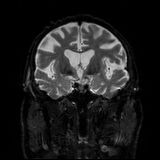
[im 43/72]
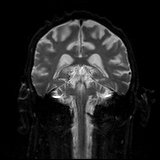
[im 57/72]
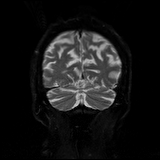
[im 72/72]
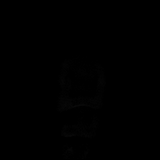

[Series 8: cor dwi_adc · coronal · 5.0mm · 1.44mm/px · 3 of 36 slices shown]
[im 1/36]
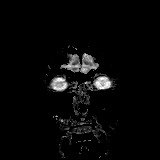
[im 18/36]
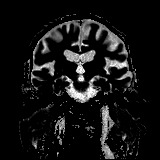
[im 36/36]
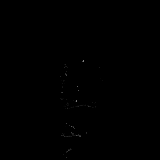

[Series 9: T1 · sagittal · 5.0mm · 0.75mm/px · 2 of 25 slices shown]
[im 1/25]
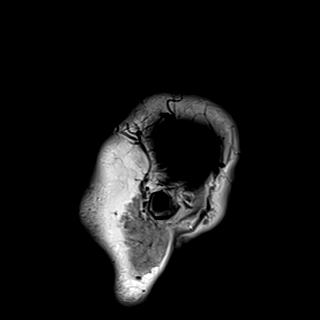
[im 25/25]
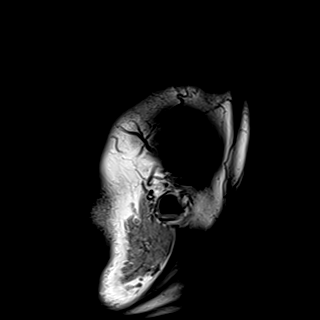

[Series 10: T2 · axial · 5.0mm · 0.69mm/px · z∈[-66,+74]mm · 2 of 25 slices shown]
[im 1/25]
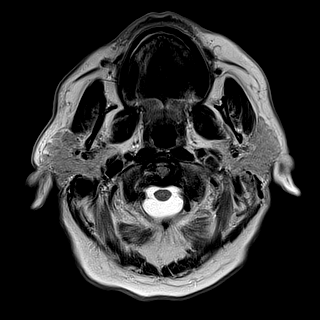
[im 25/25]
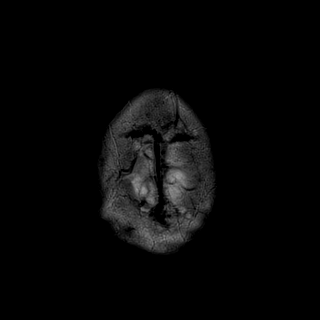

[Series 11: FLAIR · axial · 5.0mm · 0.43mm/px · z∈[-66,+74]mm · 2 of 25 slices shown]
[im 1/25]
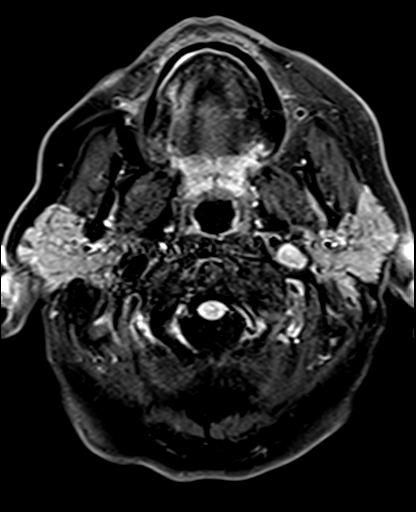
[im 25/25]
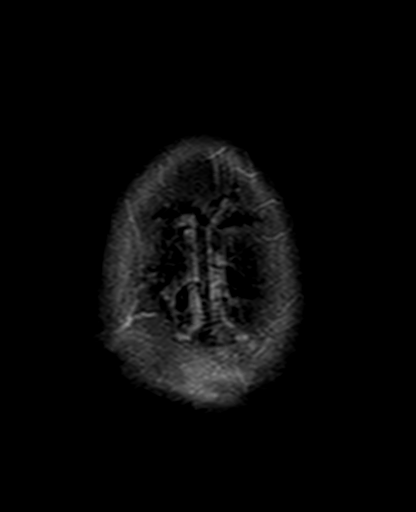

[Series 12: swi_images · axial · 3.0mm · 0.86mm/px · z∈[-77,+95]mm · 5 of 60 slices shown]
[im 1/60]
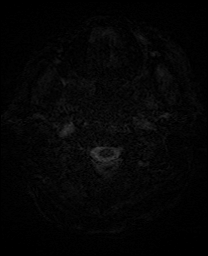
[im 15/60]
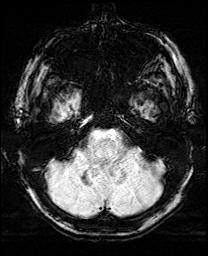
[im 30/60]
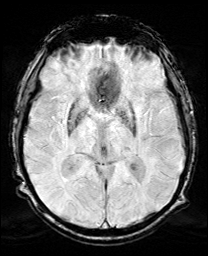
[im 45/60]
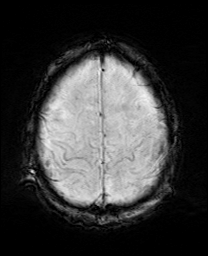
[im 60/60]
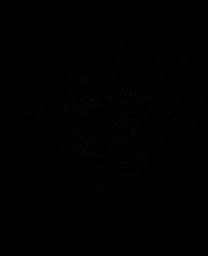

[Series 13: mip_images(sw) · axial · 24.0mm · 0.86mm/px · z∈[-67,+85]mm · 4 of 53 slices shown]
[im 1/53]
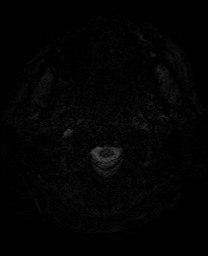
[im 18/53]
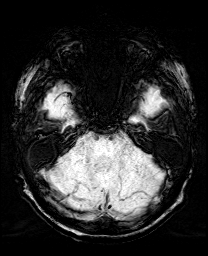
[im 35/53]
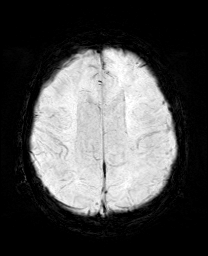
[im 53/53]
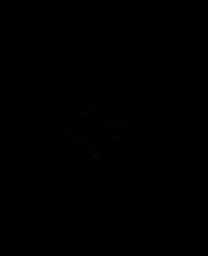

[Series 15: T2 post-contrast · coronal · 5.0mm · 0.72mm/px · 2 of 28 slices shown]
[im 1/28]
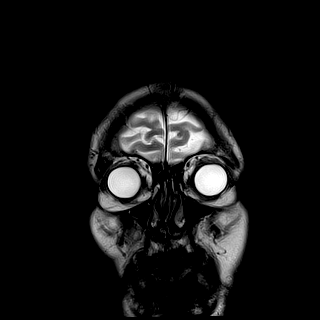
[im 28/28]
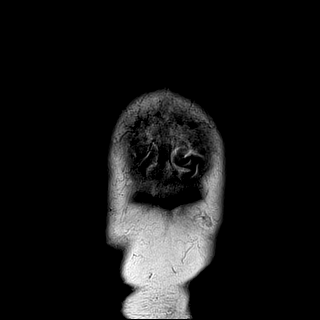

[Series 17: T1 post-contrast · coronal · 5.0mm · 0.34mm/px · 2 of 28 slices shown]
[im 1/28]
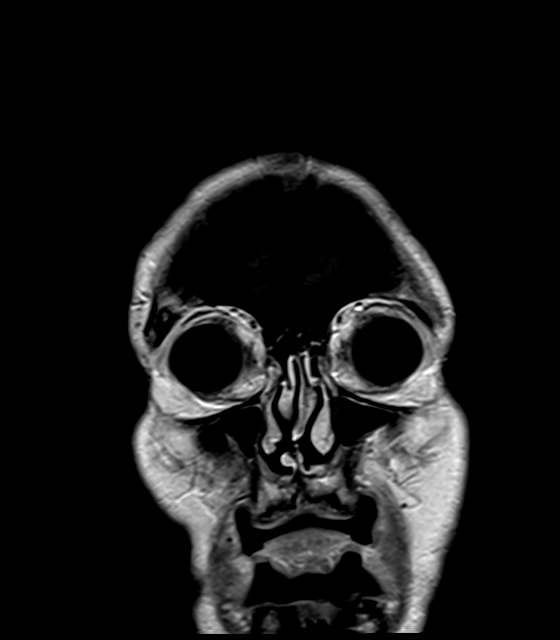
[im 28/28]
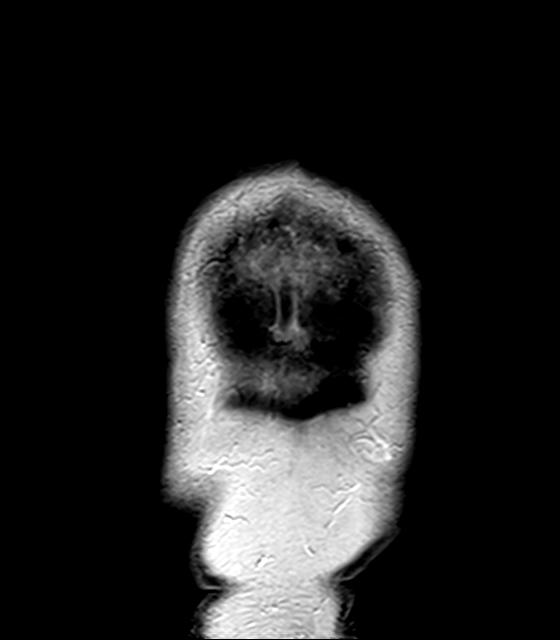

[38 of 48 positions shown; findings below may reference images not displayed]

FINDINGS: Brain: There is no evidence of acute infarct, intracranial
hemorrhage, mass, midline shift, or extra-axial fluid collection.
There is moderately advanced generalized cerebral and cerebellar
atrophy. No white matter disease is evident. No abnormal brain
parenchymal or meningeal enhancement is identified.

Vascular: Major intracranial vascular flow voids are preserved.

Skull and upper cervical spine: Unremarkable bone marrow signal.

Sinuses/Orbits: Unremarkable orbits. Left larger than right
maxillary sinus mucous retention cysts. Mild bilateral ethmoid air
cell mucosal thickening. Clear mastoid air cells.

Other: None.
IMPRESSION: 1. No evidence of intracranial metastases.
2. Moderately advanced brain parenchymal volume loss.

## 2019-06-16 ENCOUNTER — Other Ambulatory Visit: Payer: Self-pay | Admitting: *Deleted

## 2019-06-16 MED ORDER — HYDROCODONE-ACETAMINOPHEN 5-325 MG PO TABS: ORAL_TABLET | ORAL | 0 refills | Status: DC

## 2019-06-16 NOTE — Telephone Encounter (Signed)
Patient's wife called - requested refill of his pain medication

## 2019-07-21 ENCOUNTER — Other Ambulatory Visit: Payer: Self-pay | Admitting: *Deleted

## 2019-07-21 MED ORDER — HYDROCODONE-ACETAMINOPHEN 5-325 MG PO TABS: ORAL_TABLET | ORAL | 0 refills | Status: DC

## 2019-07-29 ENCOUNTER — Ambulatory Visit (HOSPITAL_COMMUNITY)
Admission: RE | Admit: 2019-07-29 | Discharge: 2019-07-29 | Disposition: A | Discharge status: Home / Self Care | Payer: Medicare HMO | Source: Ambulatory Visit | Attending: Hematology | Admitting: Hematology

## 2019-07-29 ENCOUNTER — Encounter (HOSPITAL_COMMUNITY): Payer: Self-pay

## 2019-07-29 ENCOUNTER — Other Ambulatory Visit: Payer: Self-pay

## 2019-07-29 DIAGNOSIS — C3491 Malignant neoplasm of unspecified part of right bronchus or lung: Secondary | ICD-10-CM | POA: Insufficient documentation

## 2019-08-05 ENCOUNTER — Inpatient Hospital Stay: Payer: Medicare HMO | Attending: Hematology

## 2019-08-05 ENCOUNTER — Inpatient Hospital Stay: Payer: Medicare HMO | Admitting: Hematology

## 2019-08-05 ENCOUNTER — Other Ambulatory Visit: Payer: Self-pay

## 2019-08-05 VITALS — BP 150/89 | HR 103 | Temp 98.7°F | Resp 18 | Ht 70.0 in | Wt 242.5 lb

## 2019-08-05 DIAGNOSIS — E039 Hypothyroidism, unspecified: Secondary | ICD-10-CM | POA: Insufficient documentation

## 2019-08-05 DIAGNOSIS — F1010 Alcohol abuse, uncomplicated: Secondary | ICD-10-CM | POA: Insufficient documentation

## 2019-08-05 DIAGNOSIS — I10 Essential (primary) hypertension: Secondary | ICD-10-CM | POA: Insufficient documentation

## 2019-08-05 DIAGNOSIS — E119 Type 2 diabetes mellitus without complications: Secondary | ICD-10-CM | POA: Insufficient documentation

## 2019-08-05 DIAGNOSIS — J90 Pleural effusion, not elsewhere classified: Secondary | ICD-10-CM

## 2019-08-05 DIAGNOSIS — E785 Hyperlipidemia, unspecified: Secondary | ICD-10-CM | POA: Insufficient documentation

## 2019-08-05 DIAGNOSIS — J449 Chronic obstructive pulmonary disease, unspecified: Secondary | ICD-10-CM | POA: Diagnosis not present

## 2019-08-05 DIAGNOSIS — C3401 Malignant neoplasm of right main bronchus: Secondary | ICD-10-CM | POA: Diagnosis present

## 2019-08-05 DIAGNOSIS — C3491 Malignant neoplasm of unspecified part of right bronchus or lung: Secondary | ICD-10-CM

## 2019-08-05 DIAGNOSIS — R188 Other ascites: Secondary | ICD-10-CM | POA: Diagnosis not present

## 2019-08-05 LAB — CBC WITH DIFFERENTIAL/PLATELET
Abs Immature Granulocytes: 0.03 10*3/uL (ref 0.00–0.07)
Basophils Absolute: 0 10*3/uL (ref 0.0–0.1)
Basophils Relative: 0 %
Eosinophils Absolute: 0.2 10*3/uL (ref 0.0–0.5)
Eosinophils Relative: 2 %
HCT: 40.1 % (ref 39.0–52.0)
Hemoglobin: 13 g/dL (ref 13.0–17.0)
Immature Granulocytes: 0 %
Lymphocytes Relative: 14 %
Lymphs Abs: 1 10*3/uL (ref 0.7–4.0)
MCH: 29.3 pg (ref 26.0–34.0)
MCHC: 32.4 g/dL (ref 30.0–36.0)
MCV: 90.3 fL (ref 80.0–100.0)
Monocytes Absolute: 0.8 10*3/uL (ref 0.1–1.0)
Monocytes Relative: 12 %
Neutro Abs: 5.3 10*3/uL (ref 1.7–7.7)
Neutrophils Relative %: 72 %
Platelets: 146 10*3/uL — ABNORMAL LOW (ref 150–400)
RBC: 4.44 MIL/uL (ref 4.22–5.81)
RDW: 13.9 % (ref 11.5–15.5)
WBC: 7.3 10*3/uL (ref 4.0–10.5)
nRBC: 0 % (ref 0.0–0.2)

## 2019-08-05 LAB — CMP (CANCER CENTER ONLY)
ALT: 16 U/L (ref 0–44)
AST: 20 U/L (ref 15–41)
Albumin: 3.8 g/dL (ref 3.5–5.0)
Alkaline Phosphatase: 62 U/L (ref 38–126)
Anion gap: 9 (ref 5–15)
BUN: 19 mg/dL (ref 8–23)
CO2: 28 mmol/L (ref 22–32)
Calcium: 9.7 mg/dL (ref 8.9–10.3)
Chloride: 102 mmol/L (ref 98–111)
Creatinine: 1.11 mg/dL (ref 0.61–1.24)
GFR, Est AFR Am: >60 mL/min (ref >60)
GFR, Estimated: >60 mL/min (ref >60)
Glucose, Bld: 153 mg/dL — ABNORMAL HIGH (ref 70–99)
Potassium: 4.2 mmol/L (ref 3.5–5.1)
Sodium: 139 mmol/L (ref 135–145)
Total Bilirubin: 0.3 mg/dL (ref 0.3–1.2)
Total Protein: 7.7 g/dL (ref 6.5–8.1)

## 2019-08-05 LAB — MAGNESIUM: Magnesium: 1.9 mg/dL (ref 1.7–2.4)

## 2019-08-05 LAB — TSH: TSH: 4.51 u[IU]/mL — ABNORMAL HIGH (ref 0.320–4.118)

## 2019-08-05 LAB — T4, FREE: Free T4: 0.69 ng/dL (ref 0.61–1.12)

## 2019-08-05 NOTE — Progress Notes (Signed)
HEMATOLOGY/ONCOLOGY CLINIC NOTE  Date of Service: 08/05/2019  Patient Care Team: Bernerd Limbo, MD as PCP - General (Family Medicine) Sherren Mocha, MD as PCP - Cardiology (Cardiology)  CHIEF COMPLAINTS/PURPOSE OF CONSULTATION:  F/u for mx of lung cancer  HISTORY OF PRESENTING ILLNESS:   Barry Booker is a wonderful 73 y.o. male who has been referred to [place] by Dr Marshell Garfinkel for evaluation and management of presumed newly diagnosed rt sided lung cancer with concern for bronchial obstruction.  Patient has a history of hypertension, dyslipidemia, diabetes, COPD, alcohol abuse and previously had a chest x-ray on 02/20/2018 at [facility] which showed Right upper lobe consolidation/atelectasis may relate to post obstructive pneumonitis/pneumonia. An underlying obstructing lesion should be considered. Consider CT for further assessment.  H subsequently then had a CT of the chest on 02/27/2018 which showed -5.8 x 4.9 cm mass in the right mediastinum and hilum with associated severe narrowing of the right main pulmonary artery as well as right upper lobe and right middle lobe arteries. Obstructed right main stem bronchus and collapse of the right upper lobe and right middle lobe. Small right effusion. The appearance is similar to the patient's chest radiograph from 02/20/2018.  Patient subsequently had a PET CT on 03/10/2018 by Dr. Vaughan Browner which showed Large intensely hypermetabolic RIGHT hilar mass obstructs the RIGHT upper lobe bronchus and surrounds the bronchus intermedius. Post obstructive collapse of the RIGHT upper lobe.  No hypermetabolic mediastinal or supraclavicular lymph nodes. No evidence distant metastatic disease.  Patient had a flexible video fiberoptic bronchoscopy done on 03/21/2018 by Dr. Vaughan Browner which showed which showed some nodularity of the right mainstem bronchial mucosa with near complete occlusion of right main stem. Unable to traverse the occluded part of right main stem. The  mucosa was friable with propensity to bleed. Endobronchial biopsies and brushings were taken from the R mainstem and right upper lobe for pathology.  Preliminary results showed atypical cells however final cytology/pathology is currently pending.  Patient report rt sided chest pain with deep inspiration, anorexia and weight loss of about 30lbs in the last 6 months.  Interval History:   Barry Booker returns to clinic today for management and evaluation of his lung cancer. We are joined today by his wife. The patient's last visit with [place] was on 05/13/2019. The pt reports that he is doing well overall.  The pt reports that his breathing has been about the same and he has noticed an increase in coughing. Pt has been eating well and trying to stay active. His wife notes that his blood sugars have been varying. They have continued to monitor his blood sugars at home.  Pt does not feel like doing many of the activities that he used to do. He notes an increase in fatigue and a lack of desire. Being in isolation due to the pandemic is having an effect on this as well. Pt is still smoking, but has cut down significantly. Pt is still drinking a beer every couple of days. Pt had a thoracentesis in January which did not show any cancerous cells within the fluid. He also notes issues with memory, but his wife does not think that this has worsened significantly. His PCP, Dr. Coletta Memos has been supervising pt's Lasix. He has not seen his Cardiologist since the beginning of the pandemic due to the visits not being deemed necessary. Pt does have a fair amount of salt intake in his diet.   Of note since the  patient's last visit, pt has had CT Chest ([id number]) completed on 07/29/2019 with results revealing "1. Moderate loculated right pleural effusion is increased. 2. Right perihilar soft tissue is stable and compatible with treated tumor. Stable right perihilar radiation fibrosis. 3. Subcentimeter right lower lobe  pulmonary nodule is stable. No new significant pulmonary nodules. 4. No thoracic adenopathy. 5. Cirrhosis. Aortic Atherosclerosis (ICD10-I70.0) and Emphysema (ICD10-J43.9)."  Lab results today (08/05/19) of CBC w/diff and CMP is as follows: all values are WNL except for PLTs at 146K, Glucose at 153.  08/05/2019 TSH at 4.510 08/05/2019 T4, free at 0.69  08/05/2019 Magnesium at 1.9  On review of systems, pt reports increased coughing, eating well, fatigue, memory issues and denies leg swelling, SOB, abdominal pain and any other symptoms.   MEDICAL HISTORY:  Past Medical History:  Diagnosis Date   Alcohol abuse    COPD (chronic obstructive pulmonary disease) ([place])    Diabetes mellitus without complication (HCC)    Echocardiogram    Echo 11/2018: EF 45-50, mild MAC, mild calc of AoV (difficult study) >> Limited echo with Definity contrast 11/2018: EF 50-55   Gout    High cholesterol    Hypertension    Malignant neoplasm of right upper lobe of lung ([place]) 03/29/2018    SURGICAL HISTORY: Past Surgical History:  Procedure Laterality Date   COLONOSCOPY     FLEXIBLE BRONCHOSCOPY [name] 03/21/2018   Procedure: FLEXIBLE BRONCHOSCOPY;  Surgeon: Marshell Garfinkel, MD;  Location: [facility];  Service: Pulmonary;  Laterality: [name];   NOSE SURGERY  90's   VIDEO BRONCHOSCOPY WITH ENDOBRONCHIAL ULTRASOUND [name] 03/21/2018   Procedure: VIDEO BRONCHOSCOPY WITH ENDOBRONCHIAL ULTRASOUND;  Surgeon: Marshell Garfinkel, MD;  Location: [facility];  Service: Pulmonary;  Laterality: [name];    SOCIAL HISTORY: Social History   Socioeconomic History   Marital status: Married    Spouse name: Not on file   Number of children: Not on file   Years of education: Not on file   Highest education level: Not on file  Occupational History   Not on file  Social Needs   Financial resource strain: Not on file   Food insecurity    Worry: Not on file    Inability: Not on file   Transportation needs    Medical: Not on  file    Non-medical: Not on file  Tobacco Use   Smoking status: Former Smoker    Packs/day: 0.25    Years: 50.00    Pack years: 12.50    Types: Cigarettes    Quit date: 07/2017    Years since quitting: 2.0   Smokeless tobacco: Never Used   Tobacco comment: quit smoking 07/2017  Substance and Sexual Activity   Alcohol use: Yes    Alcohol/week: 14.0 - 21.0 standard drinks    Types: 14 - 21 Cans of beer per week   Drug use: No   Sexual activity: Not on file  Lifestyle   Physical activity    Days per week: Not on file    Minutes per session: Not on file   Stress: Not on file  Relationships   Social connections    Talks on phone: Not on file    Gets together: Not on file    Attends religious service: Not on file    Active member of club or organization: Not on file    Attends meetings of clubs or organizations: Not on file    Relationship status: Not on file   Intimate partner violence  Fear of current or ex partner: Not on file    Emotionally abused: Not on file    Physically abused: Not on file    Forced sexual activity: Not on file  Other Topics Concern   Not on file  Social History Narrative   Not on file    FAMILY HISTORY: Family History  Problem Relation Age of Onset   Arthritis Mother    Cancer Father    Cancer Sister    Heart disease Maternal Uncle    Colon cancer Neg Hx     ALLERGIES:  is allergic to neomycin-bacitracin zn-polymyx.  MEDICATIONS:  Current Outpatient Medications  Medication Sig Dispense Refill   albuterol (PROVENTIL HFA;VENTOLIN HFA) 108 (90 Base) MCG/ACT inhaler Inhale 1-2 puffs into the lungs every 4 (four) hours as needed for wheezing or shortness of breath. 1 Inhaler 2   ANORO ELLIPTA 62.5-25 MCG/INH AEPB INHALE 1 PUFF INTO THE LUNGS DAILY 60 each 1   aspirin 81 MG tablet Take 81 mg by mouth daily.     atorvastatin (LIPITOR) 40 MG tablet Take 40 mg by mouth daily.      benazepril (LOTENSIN) 20 MG tablet Take  20 mg by mouth daily.      diphenhydrAMINE (BENADRYL) 12.5 MG/5ML elixir Take 12.5 mg by mouth daily as needed for allergies.      folic acid (FOLVITE) 263 MCG tablet Take 400 mcg by mouth daily.     furosemide (LASIX) 40 MG tablet Take 40 mg by mouth daily.      HYDROcodone-acetaminophen (NORCO) 5-325 MG tablet 1/2 to 1 tablet 4 times daily as needed for pain. 60 tablet 0   ibuprofen (ADVIL,MOTRIN) 200 MG tablet Take 400 mg by mouth every 6 (six) hours as needed for headache, mild pain or moderate pain.     levothyroxine (SYNTHROID) 25 MCG tablet TAKE 1 TABLET BY MOUTH DAILY BEFORE BREAKFAST. 90 tablet 1   metFORMIN (GLUCOPHAGE) 500 MG tablet Take 500 mg by mouth every morning.     metoprolol succinate (TOPROL XL) 25 MG 24 hr tablet Take 1 tablet (25 mg total) by mouth daily. 90 tablet 3   potassium chloride SA (K-DUR) 20 MEQ tablet Take 20 mEq by mouth daily.     prochlorperazine (COMPAZINE) 10 MG tablet Take 10 mg by mouth every 6 (six) hours as needed for nausea or vomiting.      UNABLE TO FIND Take by mouth.     No current facility-administered medications for this visit.     REVIEW OF SYSTEMS:   A 10+ POINT REVIEW OF SYSTEMS WAS OBTAINED including neurology, dermatology, psychiatry, cardiac, respiratory, lymph, extremities, GI, GU, Musculoskeletal, constitutional, breasts, reproductive, HEENT.  All pertinent positives are noted in the HPI.  All others are negative.   PHYSICAL EXAMINATION: ECOG PERFORMANCE STATUS: 1 - Symptomatic but completely ambulatory  Vitals:   08/05/19 1146  BP: (!) 150/89  Pulse: (!) 103  Resp: 18  Temp: 98.7 F (37.1 C)  SpO2: 97%   Filed Weights   08/05/19 1146  Weight: 242 lb 8 oz (110 kg)   .Body mass index is 34.8 kg/m.   Exam was given in a chair   GENERAL:alert, in no acute distress and comfortable SKIN: no acute rashes, no significant lesions EYES: conjunctiva are pink and non-injected, sclera anicteric OROPHARYNX: MMM, no  exudates, no oropharyngeal erythema or ulceration NECK: supple, no JVD LYMPH:  no palpable lymphadenopathy in the cervical, axillary or inguinal regions LUNGS: fluid 1/3 up  the chest wall on the right side HEART: regular rate & rhythm ABDOMEN:  normoactive bowel sounds , non tender, not distended. No palpable hepatosplenomegaly.  Extremity: 1+ pedal edema b/l PSYCH: alert & oriented x 3 with fluent speech NEURO: no focal motor/sensory deficits  LABORATORY DATA:  I have reviewed the data as listed . CBC Latest Ref Rng & Units 08/05/2019 03/13/2019 01/30/2019  WBC 4.0 - 10.5 K/uL 7.3 9.5 10.3  Hemoglobin 13.0 - 17.0 g/dL 13.0 12.0(L) 12.0(L)  Hematocrit 39.0 - 52.0 % 40.1 38.4(L) 35.0(L)  Platelets 150 - 400 K/uL 146(L) 178 161    . CMP Latest Ref Rng & Units 08/05/2019 03/13/2019 01/30/2019  Glucose 70 - 99 mg/dL 153(H) 172(H) 504(HH)  BUN 8 - 23 mg/dL 19 19 28(H)  Creatinine 0.61 - 1.24 mg/dL 1.11 1.20 1.30(H)  Sodium 135 - 145 mmol/L 139 140 133(L)  Potassium 3.5 - 5.1 mmol/L 4.2 4.4 4.9  Chloride 98 - 111 mmol/L 102 103 99  CO2 22 - 32 mmol/L _0 Calcium 8.9 - 10.3 mg/dL 9.7 9.8 9.5  Total Protein 6.5 - 8.1 g/dL 7.7 8.1 8.0  Total Bilirubin 0.3 - 1.2 mg/dL 0.3 0.3 0.7  Alkaline Phos 38 - 126 U/L 62 67 78  AST 15 - 41 U/L 20 14(L) 16  ALT 0 - 44 U/L _1 03/21/18   03/25/18 Endobronchial bx:     RADIOGRAPHIC STUDIES: I have personally reviewed the radiological images as listed and agreed with the findings in the report. 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 Restaging. EXAM: CT CHEST WITHOUT CONTRAST TECHNIQUE: Multidetector CT imaging of the chest was performed following the standard protocol without IV contrast. COMPARISON:  03/06/2019 chest CT. FINDINGS: Cardiovascular: Normal heart size. No significant  pericardial effusion/thickening. Left anterior descending and left circumflex coronary atherosclerosis. Atherosclerotic nonaneurysmal thoracic aorta. Normal caliber pulmonary arteries. Mediastinum/Nodes: No discrete thyroid nodules. Stable mild circumferential wall thickening in the midthoracic esophagus, presumably treatment related. No axillary adenopathy. No pathologically enlarged mediastinal or discrete left hilar nodes on this noncontrast scan. Lungs/Pleura: No pneumothorax. Moderate loculated right pleural effusion is increased, predominantly basilar. No left pleural effusion. Poorly marginated right perihilar soft tissue encasing right mainstem bronchus and central right upper, right middle and right lower lobe airways measures approximately 3.8 x 3.6 cm series 2/image 54), previously 3.9 x 3.7 cm, not appreciably changed. Sharply marginated right perihilar consolidation with associated volume loss, bronchiectasis and distortion, compatible with radiation fibrosis. Posterior right lower lobe 5 mm solid pulmonary nodule (series 7/image 65) appears stable. Ill-defined nodular focus consolidation in the basilar left lower lobe measuring 9 mm (series 7/image 135), stable. No new significant pulmonary nodules. Mild centrilobular emphysema. Upper abdomen: Diffusely irregular liver surface, compatible with hepatic cirrhosis. Musculoskeletal: No aggressive appearing focal osseous lesions. Moderate thoracic spondylosis. Chronic moderate L1 vertebral compression fracture. IMPRESSION: 1. Moderate loculated right pleural effusion is increased. 2. Right perihilar soft tissue is stable and compatible with treated tumor. Stable right perihilar radiation fibrosis. 3. Subcentimeter right lower lobe pulmonary nodule is stable. No new significant pulmonary nodules. 4. No thoracic adenopathy. 5. Cirrhosis. Aortic Atherosclerosis (ICD10-I70.0) and Emphysema (ICD10-J43.9). Electronically Signed   By: Ilona Sorrel M.D.   On:  07/29/2019 12:52    ASSESSMENT & PLAN:   73 y.o.  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 Post obstructive collapse of the Right upper lobe. No hypermetabolic mediastinal or supraclavicular lymph nodes. No evidence of distant mets.  06/23/18 PET/CT revealed  Marked reduction in activity associated with the dominant right upper lobe central mass, maximum SUV 3.4 and previously 15.3. Please note that this lesion does still in case and markedly narrows the right upper lobe bronchus. 2. There is some patchy ground-glass opacities with small airspace opacity components in the right upper lobe, right middle lobe, right lower lobe, and lingula. Some of these are hypermetabolic, for example lingular opacity has a maximum SUV of 6.2 and the right lower lobe bandlike opacity 5.7. These are probably inflammatory and merit surveillance. 3. Small to moderate right pleural effusion with accentuated metabolic activity. Malignant effusion not excluded. 4. No findings of metastatic disease to the neck, abdomen/pelvis, or skeleton. 5. Aortic aneurysm NOS. Infrarenal aortic aneurysm 3.8 cm in diameter. Recommend followup by [place] in 2 years. 6. Other imaging findings of potential clinical significance: Chronic paranasal sinusitis. Aortic Atherosclerosis. Coronary atherosclerosis. Hepatic cirrhosis. Ventral hernia containing a loop of small bowel. Suspected late phase healing of the right anterior eighth rib.   09/18/18 CT Chest revealed Rarely relatively stable perihilar consolidation on the RIGHT consistent post treatment change. Persistent narrowing of the proximal  bronchi. 2. Interval increase in interstitial thickening in the RIGHT upper lobe. Differential include postobstructive process versus lymphangitic carcinoma. Favor postobstructive process. 3. Interval increase in RIGHT pleural effusion. 4. With potential progressive malignant findings versus progressive benign change, consider follow-up FDG PET scan.   10/02/18 Cytology from thoracentesis showed no malignant cells. 10/02/18 CXR revealed No evidence of pneumothorax after RIGHT thoracentesis. 2. Persistent large, loculated RIGHT pleural effusion and associated dense passive atelectasis in the RIGHT LOWER LOBE. 3. No new abnormalities.  10/29/18 CT Chest revealed Interval decrease in right pleural effusion with residual loculated component seen anteriorly and medially. 2. The interstitial and airspace disease in the posterior right upper lung is similar to prior. 3. Stable volume loss right hemithorax with right parahilar interstitial and airspace disease likely reflecting radiation fibrosis. 4. Nodular liver morphology suggests cirrhosis.  S/p 5 cycles of Durvalumab  03/06/19 CT Chest revealed "Stable appearing extensive radiation changes and treated tumor in the right upper lobe and right paramediastinal lung and right hilum. I do not see any definite CT findings for progressive disease. 2. Stable loculated right pleural fluid collections. 3. No CT findings to suggest metastatic pulmonary nodules in the left lung or metastatic disease involving the upper abdomen. 4. Stable cirrhotic changes involving the liver. Aortic Atherosclerosis and Emphysema."  11/24/18 ECHO revealed LV EF of 50-55%  2) RUL airway obstruction with RUL lung collapse. 3) Concern for severe narrowing of the right main pulmonary artery as well as right upper lobe and right middle lobe arteries.   4.  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  PLAN: -Discussed pt labwork today, 08/05/19; all values are WNL except for PLTs at 146K, Glucose at 153.  -Discussed 08/05/2019 TSH at 4.510 -Discussed 08/05/2019 T4, free at 0.69   -Discussed 08/05/2019 Magnesium at 1.9  -Discussed 07/29/2019 CT Chest ([id number]) which revealed "1. Moderate loculated right pleural effusion is increased. 2. Right perihilar soft tissue is stable and compatible with treated tumor. Stable right perihilar radiation fibrosis. 3. Subcentimeter right lower lobe pulmonary nodule is stable. No new significant pulmonary nodules. 4. No thoracic adenopathy. 5. Cirrhosis. Aortic Atherosclerosis (ICD10-I70.0) and Emphysema (ICD10-J43.9)." -The pt shows no overt clinical or lab progression of Squmaous Cell lung Cancer at this time.  -Pt prefers watchful observation at this time. Pt's blood glucose is better controlled at this time.  -Advised pt that pleural effusion is most likely related to CHF or liver cirrhosis, not malignancy  -Advised pt to optimize diuretics with Dr. Coletta Memos  -Continue Albuterol -Continue Norco prn -Recommend decreasing salt intake -Will set up US Thoracentesis Asp Pleural space right side in 5-7 days -Will order an US abdomen for evaluation of ascites -Will see back in 2 weeks via phone    FOLLOW UP: US guided rt sided thoracentesis in 5-7 days [place] abd for evaluation of ascites Phone visit with Dr Irene Limbo in 2 weeks  The total time spent in the appt was 30 minutes and more than 50% was on counseling and direct patient cares.  All of the patient's questions were answered with apparent satisfaction. The patient knows to call the clinic with any problems, questions or concerns.  Sullivan Lone MD [place] AAHIVMS [facility]  [facility] Hematology/Oncology Physician [facility]  (Office):       [phone] (Work cell):  [phone] (Fax):           [phone]   I, Yevette Edwards, am acting as a scribe for Dr. Sullivan Lone.   .I have reviewed the above documentation for accuracy and completeness, and I agree with the above. Brunetta Genera MD

## 2019-08-06 ENCOUNTER — Telehealth: Payer: Self-pay | Admitting: Hematology

## 2019-08-06 NOTE — Telephone Encounter (Signed)
Scheduled appt per 11/4 los.  Spoke with Mardene Celeste and she is aware of the appt dates and time.

## 2019-08-07 ENCOUNTER — Other Ambulatory Visit (HOSPITAL_COMMUNITY)
Admission: RE | Admit: 2019-08-07 | Discharge: 2019-08-07 | Disposition: A | Discharge status: Home / Self Care | Payer: Medicare HMO | Source: Ambulatory Visit | Attending: Hematology | Admitting: Hematology

## 2019-08-07 DIAGNOSIS — Z01812 Encounter for preprocedural laboratory examination: Secondary | ICD-10-CM | POA: Insufficient documentation

## 2019-08-07 DIAGNOSIS — Z20828 Contact with and (suspected) exposure to other viral communicable diseases: Secondary | ICD-10-CM | POA: Diagnosis not present

## 2019-08-07 LAB — SARS CORONAVIRUS 2 (TAT 6-24 HRS): SARS Coronavirus 2: NEGATIVE

## 2019-08-10 ENCOUNTER — Ambulatory Visit (HOSPITAL_COMMUNITY)
Admission: RE | Admit: 2019-08-10 | Discharge: 2019-08-10 | Disposition: A | Discharge status: Home / Self Care | Payer: Medicare HMO | Source: Ambulatory Visit | Attending: Hematology | Admitting: Hematology

## 2019-08-10 ENCOUNTER — Other Ambulatory Visit: Payer: Self-pay

## 2019-08-10 ENCOUNTER — Ambulatory Visit (HOSPITAL_COMMUNITY)
Admission: RE | Admit: 2019-08-10 | Discharge: 2019-08-10 | Disposition: A | Discharge status: Home / Self Care | Payer: Medicare HMO | Source: Ambulatory Visit | Attending: Radiology | Admitting: Radiology

## 2019-08-10 DIAGNOSIS — C3491 Malignant neoplasm of unspecified part of right bronchus or lung: Secondary | ICD-10-CM | POA: Insufficient documentation

## 2019-08-10 DIAGNOSIS — Z9889 Other specified postprocedural states: Secondary | ICD-10-CM | POA: Insufficient documentation

## 2019-08-10 DIAGNOSIS — J90 Pleural effusion, not elsewhere classified: Secondary | ICD-10-CM | POA: Diagnosis not present

## 2019-08-10 LAB — GRAM STAIN

## 2019-08-10 LAB — BODY FLUID CELL COUNT WITH DIFFERENTIAL
Eos, Fluid: 1 %
Lymphs, Fluid: 75 %
Monocyte-Macrophage-Serous Fluid: 23 % — ABNORMAL LOW (ref 50–90)
Neutrophil Count, Fluid: 1 % (ref 0–25)
Total Nucleated Cell Count, Fluid: 985 cu mm (ref 0–1000)

## 2019-08-10 LAB — LACTATE DEHYDROGENASE, PLEURAL OR PERITONEAL FLUID: LD, Fluid: 99 U/L — ABNORMAL HIGH (ref 3–23)

## 2019-08-10 LAB — PROTEIN, PLEURAL OR PERITONEAL FLUID: Total protein, fluid: 5.4 g/dL

## 2019-08-10 MED ORDER — LIDOCAINE HCL 1 % IJ SOLN
INTRAMUSCULAR | Status: AC
  Filled 2019-08-10: qty 20

## 2019-08-10 NOTE — Procedures (Signed)
Ultrasound-guided diagnostic and therapeutic right thoracentesis performed yielding 1.5 liters of amber fluid. No immediate complications. Follow-up chest x-ray pending. A portion of the fluid was sent to the lab for preordered studies. EBL< 2cc.

## 2019-08-12 LAB — CYTOLOGY - NON PAP

## 2019-08-13 ENCOUNTER — Ambulatory Visit (HOSPITAL_COMMUNITY): Payer: Medicare HMO

## 2019-08-15 LAB — CULTURE, BODY FLUID W GRAM STAIN -BOTTLE: Culture: NO GROWTH

## 2019-08-17 ENCOUNTER — Ambulatory Visit (HOSPITAL_COMMUNITY)
Admission: RE | Admit: 2019-08-17 | Discharge: 2019-08-17 | Disposition: A | Discharge status: Home / Self Care | Payer: Medicare HMO | Source: Ambulatory Visit | Attending: Hematology | Admitting: Hematology

## 2019-08-17 ENCOUNTER — Other Ambulatory Visit: Payer: Self-pay

## 2019-08-17 DIAGNOSIS — C3491 Malignant neoplasm of unspecified part of right bronchus or lung: Secondary | ICD-10-CM | POA: Diagnosis present

## 2019-08-18 NOTE — Progress Notes (Signed)
HEMATOLOGY/ONCOLOGY CLINIC NOTE  Date of Service: 08/18/2019  Patient Care Team: Bernerd Limbo, MD as PCP - General (Family Medicine) Sherren Mocha, MD as PCP - Cardiology (Cardiology)  CHIEF COMPLAINTS/PURPOSE OF CONSULTATION:  F/u for mx of lung cancer  HISTORY OF PRESENTING ILLNESS:   Barry Booker is a wonderful 73 y.o. male who has been referred to [place] by Dr Marshell Garfinkel for evaluation and management of presumed newly diagnosed rt sided lung cancer with concern for bronchial obstruction.  Patient has a history of hypertension, dyslipidemia, diabetes, COPD, alcohol abuse and previously had a chest x-ray on 02/20/2018 at [facility] which showed Right upper lobe consolidation/atelectasis may relate to post obstructive pneumonitis/pneumonia. An underlying obstructing lesion should be considered. Consider CT for further assessment.  H subsequently then had a CT of the chest on 02/27/2018 which showed -5.8 x 4.9 cm mass in the right mediastinum and hilum with associated severe narrowing of the right main pulmonary artery as well as right upper lobe and right middle lobe arteries. Obstructed right main stem bronchus and collapse of the right upper lobe and right middle lobe. Small right effusion. The appearance is similar to the patient's chest radiograph from 02/20/2018.  Patient subsequently had a PET CT on 03/10/2018 by Dr. Vaughan Browner which showed Large intensely hypermetabolic RIGHT hilar mass obstructs the RIGHT upper lobe bronchus and surrounds the bronchus intermedius. Post obstructive collapse of the RIGHT upper lobe.  No hypermetabolic mediastinal or supraclavicular lymph nodes. No evidence distant metastatic disease.  Patient had a flexible video fiberoptic bronchoscopy done on 03/21/2018 by Dr. Vaughan Browner which showed which showed some nodularity of the right mainstem bronchial mucosa with near complete occlusion of right main stem. Unable to traverse the occluded part of right main stem.  The mucosa was friable with propensity to bleed. Endobronchial biopsies and brushings were taken from the R mainstem and right upper lobe for pathology.  Preliminary results showed atypical cells however final cytology/pathology is currently pending.  Patient report rt sided chest pain with deep inspiration, anorexia and weight loss of about 30lbs in the last 6 months.  Interval History:    I connected with Barry Booker on 08/19/19 at  8:40 AM EST by Telemed and verified that I am speaking with the correct person using two identifiers.   I discussed the limitations, risks, security and privacy concerns of performing an evaluation and management service by telemedicine and the availability of in-person appointments. I also discussed with the patient that there may be a patient responsible charge related to this service. The patient expressed understanding and agreed to proceed.   Other persons participating in the visit and their role in the encounter:Wife, Barry Booker   Patients location: Home  Providers location: [facility]   Chief Complaint: F/u for mx of lung cancer  Barry Booker returns to clinic today for management and evaluation of his lung cancer. We are joined today by his wife. The patient's last visit with [place] was on 08/05/2019. The pt reports that he is doing well overall.  The pt reports he is feeling fine his breathing has had some improved since having fluid removed on 08/17/2019.  Of note since the patient's last visit, pt has had CHEST  1 VIEW (Accession [id number]) completed on 08/10/2019 with results revealing "No pneumothorax status post right-sided thoracentesis. Chronic lung changes as above."  Of note since the patient's last visit, pt has had ULTRASOUND GUIDED DIAGNOSTIC AND THERAPEUTIC RIGHT THORACENTESIS (Accession [id number])  completed on 08/10/2019 with results revealing "Successful ultrasound guided diagnostic and therapeutic right thoracentesis yielding 1.5  liters of pleural fluid."  Of note since the patient's last visit, pt has had [place] (Accession [id number]) completed on 08/17/2019 with results revealing "Diffuse hepatic steatosis. Cirrhosis cannot be excluded by ultrasound. No hepatic mass visualized. Distal abdominal aortic aneurysm measuring 3.8 cm. Recommend followup by ultrasound in 2 years. This recommendation follows ACR consensus guidelines: White Paper of the ACR Incidental Findings Committee II on Vascular Findings. J Am Coll Radiol 2013; 38:937-342."    Lab results today (08/10/19) of LDH (pleural or peritoneal fluid) is as follows: all values are WNL except for LD, fluid at 99.  On review of systems, pt reports doing better.  MEDICAL HISTORY:  Past Medical History:  Diagnosis Date   Alcohol abuse    COPD (chronic obstructive pulmonary disease) ([place])    Diabetes mellitus without complication (HCC)    Echocardiogram    Echo 11/2018: EF 45-50, mild MAC, mild calc of AoV (difficult study) >> Limited echo with Definity contrast 11/2018: EF 50-55   Gout    High cholesterol    Hypertension    Malignant neoplasm of right upper lobe of lung ([place]) 03/29/2018    SURGICAL HISTORY: Past Surgical History:  Procedure Laterality Date   COLONOSCOPY     FLEXIBLE BRONCHOSCOPY [name] 03/21/2018   Procedure: FLEXIBLE BRONCHOSCOPY;  Surgeon: Marshell Garfinkel, MD;  Location: [place];  Service: Pulmonary;  Laterality: [name];   NOSE SURGERY  90's   VIDEO BRONCHOSCOPY WITH ENDOBRONCHIAL ULTRASOUND [name] 03/21/2018   Procedure: VIDEO BRONCHOSCOPY WITH ENDOBRONCHIAL ULTRASOUND;  Surgeon: Marshell Garfinkel, MD;  Location: [place];  Service: Pulmonary;  Laterality: [name];    SOCIAL HISTORY: Social History   Socioeconomic History   Marital status: Married    Spouse name: Not on file   Number of children: Not on file   Years of education: Not on file   Highest education level: Not on file  Occupational History   Not on file   Social Needs   Financial resource strain: Not on file   Food insecurity    Worry: Not on file    Inability: Not on file   Transportation needs    Medical: Not on file    Non-medical: Not on file  Tobacco Use   Smoking status: Former Smoker    Packs/day: 0.25    Years: 50.00    Pack years: 12.50    Types: Cigarettes    Quit date: 07/2017    Years since quitting: 2.1   Smokeless tobacco: Never Used   Tobacco comment: quit smoking 07/2017  Substance and Sexual Activity   Alcohol use: Yes    Alcohol/week: 14.0 - 21.0 standard drinks    Types: 14 - 21 Cans of beer per week   Drug use: No   Sexual activity: Not on file  Lifestyle   Physical activity    Days per week: Not on file    Minutes per session: Not on file   Stress: Not on file  Relationships   Social connections    Talks on phone: Not on file    Gets together: Not on file    Attends religious service: Not on file    Active member of club or organization: Not on file    Attends meetings of clubs or organizations: Not on file    Relationship status: Not on file   Intimate partner violence    Fear  of current or ex partner: Not on file    Emotionally abused: Not on file    Physically abused: Not on file    Forced sexual activity: Not on file  Other Topics Concern   Not on file  Social History Narrative   Not on file    FAMILY HISTORY: Family History  Problem Relation Age of Onset   Arthritis Mother    Cancer Father    Cancer Sister    Heart disease Maternal Uncle    Colon cancer Neg Hx     ALLERGIES:  is allergic to neomycin-bacitracin zn-polymyx.  MEDICATIONS:  Current Outpatient Medications  Medication Sig Dispense Refill   albuterol (PROVENTIL HFA;VENTOLIN HFA) 108 (90 Base) MCG/ACT inhaler Inhale 1-2 puffs into the lungs every 4 (four) hours as needed for wheezing or shortness of breath. 1 Inhaler 2   ANORO ELLIPTA 62.5-25 MCG/INH AEPB INHALE 1 PUFF INTO THE LUNGS DAILY 60  each 1   aspirin 81 MG tablet Take 81 mg by mouth daily.     atorvastatin (LIPITOR) 40 MG tablet Take 40 mg by mouth daily.      benazepril (LOTENSIN) 20 MG tablet Take 20 mg by mouth daily.      diphenhydrAMINE (BENADRYL) 12.5 MG/5ML elixir Take 12.5 mg by mouth daily as needed for allergies.      folic acid (FOLVITE) 702 MCG tablet Take 400 mcg by mouth daily.     furosemide (LASIX) 40 MG tablet Take 40 mg by mouth daily.      HYDROcodone-acetaminophen (NORCO) 5-325 MG tablet 1/2 to 1 tablet 4 times daily as needed for pain. 60 tablet 0   ibuprofen (ADVIL,MOTRIN) 200 MG tablet Take 400 mg by mouth every 6 (six) hours as needed for headache, mild pain or moderate pain.     levothyroxine (SYNTHROID) 25 MCG tablet TAKE 1 TABLET BY MOUTH DAILY BEFORE BREAKFAST. 90 tablet 1   metFORMIN (GLUCOPHAGE) 500 MG tablet Take 500 mg by mouth every morning.     metoprolol succinate (TOPROL XL) 25 MG 24 hr tablet Take 1 tablet (25 mg total) by mouth daily. 90 tablet 3   potassium chloride SA (K-DUR) 20 MEQ tablet Take 20 mEq by mouth daily.     prochlorperazine (COMPAZINE) 10 MG tablet Take 10 mg by mouth every 6 (six) hours as needed for nausea or vomiting.      UNABLE TO FIND Take by mouth.     No current facility-administered medications for this visit.     REVIEW OF SYSTEMS:   A 10+ POINT REVIEW OF SYSTEMS WAS OBTAINED including neurology, dermatology, psychiatry, cardiac, respiratory, lymph, extremities, GI, GU, Musculoskeletal, constitutional, breasts, reproductive, HEENT.  All pertinent positives are noted in the HPI.  All others are negative.    PHYSICAL EXAMINATION: There were no vitals filed for this visit. Wt Readings from Last 3 Encounters:  08/05/19 242 lb 8 oz (110 kg)  05/13/19 235 lb 6.4 oz (106.8 kg)  03/13/19 222 lb 14.4 oz (101.1 kg)   There is no height or weight on file to calculate BMI.    Telehealth Visit 08/18/19   LABORATORY DATA:  I have reviewed the data  as listed . CBC Latest Ref Rng & Units 08/05/2019 03/13/2019 01/30/2019  WBC 4.0 - 10.5 K/uL 7.3 9.5 10.3  Hemoglobin 13.0 - 17.0 g/dL 13.0 12.0(L) 12.0(L)  Hematocrit 39.0 - 52.0 % 40.1 38.4(L) 35.0(L)  Platelets 150 - 400 K/uL 146(L) 178 161    . CMP  Latest Ref Rng & Units 08/05/2019 03/13/2019 01/30/2019  Glucose 70 - 99 mg/dL 153(H) 172(H) 504(HH)  BUN 8 - 23 mg/dL 19 19 28(H)  Creatinine 0.61 - 1.24 mg/dL 1.11 1.20 1.30(H)  Sodium 135 - 145 mmol/L 139 140 133(L)  Potassium 3.5 - 5.1 mmol/L 4.2 4.4 4.9  Chloride 98 - 111 mmol/L 102 103 99  CO2 22 - 32 mmol/L _0 Calcium 8.9 - 10.3 mg/dL 9.7 9.8 9.5  Total Protein 6.5 - 8.1 g/dL 7.7 8.1 8.0  Total Bilirubin 0.3 - 1.2 mg/dL 0.3 0.3 0.7  Alkaline Phos 38 - 126 U/L 62 67 78  AST 15 - 41 U/L 20 14(L) 16  ALT 0 - 44 U/L _1 08/10/2019  08/10/2019 Cytology- PAP  03/25/18 Endobronchial bx:     RADIOGRAPHIC STUDIES: I have personally reviewed the radiological images as listed and agreed with the findings in the report. Dg Chest 1 View  Result Date: 08/10/2019 CLINICAL DATA:  Status post right-sided thoracentesis EXAM: CHEST  1 VIEW COMPARISON:  Jan 30, 2019 FINDINGS: Chronic lung changes are noted throughout the right lung field with volume loss and shift of the mediastinum to the right. There is a moderate likely loculated right-sided pleural effusion. No evidence for right-sided pneumothorax. There is stable blunting of the left costophrenic angle. The left lung field is likely hyperexpanded. IMPRESSION: No pneumothorax status post right-sided thoracentesis. Chronic lung changes as above. Electronically Signed   By: Constance Holster M.D.   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 Restaging. EXAM: CT CHEST WITHOUT CONTRAST TECHNIQUE: Multidetector  CT imaging of the chest was performed following the standard protocol without IV contrast. COMPARISON:  03/06/2019 chest CT. FINDINGS: Cardiovascular: Normal heart size. No significant pericardial effusion/thickening. Left anterior descending and left circumflex coronary atherosclerosis. Atherosclerotic nonaneurysmal thoracic aorta. Normal caliber pulmonary arteries. Mediastinum/Nodes: No discrete thyroid nodules. Stable mild circumferential wall thickening in the midthoracic esophagus, presumably treatment related. No axillary adenopathy. No pathologically enlarged mediastinal or discrete left hilar nodes on this noncontrast scan. Lungs/Pleura: No pneumothorax. Moderate loculated right pleural effusion is increased, predominantly basilar. No left pleural effusion. Poorly marginated right perihilar soft tissue encasing right mainstem bronchus and central right upper, right middle and right lower lobe airways measures approximately 3.8 x 3.6 cm series 2/image 54), previously 3.9 x 3.7 cm, not appreciably changed. Sharply marginated right perihilar consolidation with associated volume loss, bronchiectasis and distortion, compatible with radiation fibrosis. Posterior right lower lobe 5 mm solid pulmonary nodule (series 7/image 65) appears stable. Ill-defined nodular focus consolidation in the basilar left lower lobe measuring 9 mm (series 7/image 135), stable. No new significant pulmonary nodules. Mild centrilobular emphysema. Upper abdomen: Diffusely irregular liver surface, compatible with hepatic cirrhosis. Musculoskeletal: No aggressive appearing focal osseous lesions. Moderate thoracic spondylosis. Chronic moderate L1 vertebral compression fracture. IMPRESSION: 1. Moderate loculated right pleural effusion is increased. 2. Right perihilar soft tissue is stable and compatible with treated tumor. Stable right perihilar radiation fibrosis. 3. Subcentimeter right lower lobe pulmonary nodule is stable. No new  significant pulmonary nodules. 4. No thoracic adenopathy. 5. Cirrhosis. Aortic Atherosclerosis (ICD10-I70.0) and Emphysema (ICD10-J43.9). Electronically Signed   By: Ilona Sorrel M.D.   On: 07/29/2019 12:52   US Abdomen Complete  Result Date: 08/17/2019 CLINICAL DATA:  Abdominal distention. Alcohol misuse.  Right lung squamous cell carcinoma. EXAM: ABDOMEN ULTRASOUND COMPLETE COMPARISON:  One hundred fourteen FINDINGS: Gallbladder: No gallstones or wall thickening visualized. No sonographic Murphy sign noted by sonographer. Common bile duct: Diameter: 3 mm, within normal limits. Liver: Diffusely increased echogenicity of the hepatic parenchyma, consistent with hepatic steatosis. No hepatic mass identified. Portal vein is patent on color Doppler imaging with normal direction of blood flow towards the liver. IVC: No abnormality visualized. Pancreas: Visualized portion unremarkable. Spleen: Size and appearance within normal limits. Right Kidney: Length: 10.0 cm. Echogenicity within normal limits. Small cyst seen which measures 3.0 cm in maximum diameter. No mass or hydronephrosis visualized. Left Kidney: Length: 11.0 cm. Echogenicity within normal limits. Tiny cyst seen measuring 1.5 cm. No mass or hydronephrosis visualized. Abdominal aorta: A fusiform aneurysm of the distal abdominal aorta is seen which measures 3.8 cm in maximum diameter. Other findings: None. IMPRESSION: Diffuse hepatic steatosis. Cirrhosis cannot be excluded by ultrasound. No hepatic mass visualized. Distal abdominal aortic aneurysm measuring 3.8 cm. Recommend followup by ultrasound in 2 years. This recommendation follows ACR consensus guidelines: White Paper of the ACR Incidental Findings Committee II on Vascular Findings. J Am Coll Radiol 2013; 10:789-794. Electronically Signed   By: Marlaine Hind M.D.   On: 08/17/2019 07:56   US Thoracentesis Asp Pleural Space W/img Guide  Result Date: 08/10/2019 INDICATION: Patient with history of  squamous cell carcinoma of the lung, recurrent right pleural effusion. Request made for diagnostic and therapeutic right thoracentesis. EXAM: ULTRASOUND GUIDED DIAGNOSTIC AND THERAPEUTIC RIGHT THORACENTESIS MEDICATIONS: None COMPLICATIONS: None immediate. PROCEDURE: An ultrasound guided thoracentesis was thoroughly discussed with the patient and questions answered. The benefits, risks, alternatives and complications were also discussed. The patient understands and wishes to proceed with the procedure. Written consent was obtained. Ultrasound was performed to localize and mark an adequate pocket of fluid in the right chest. The area was then prepped and draped in the normal sterile fashion. 1% Lidocaine was used for local anesthesia. Under ultrasound guidance a 6 Fr Safe-T-Centesis catheter was introduced. Thoracentesis was performed. The catheter was removed and a dressing applied. FINDINGS: A total of approximately 1.5 liters of amber fluid was removed. Samples were sent to the laboratory as requested by the clinical team. IMPRESSION: Successful ultrasound guided diagnostic and therapeutic right thoracentesis yielding 1.5 liters of pleural fluid. Read by: Rowe Robert, PA-C Electronically Signed   By: Aletta Edouard M.D.   On: 08/10/2019 15:30    ASSESSMENT & PLAN:   73 y.o.  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 Post obstructive collapse of the Right upper lobe. No hypermetabolic mediastinal or supraclavicular lymph nodes. No evidence of distant mets.  06/23/18 PET/CT revealed  Marked reduction in activity associated with the dominant right upper  lobe central mass, maximum SUV 3.4 and previously 15.3. Please note that this lesion does still in case and markedly narrows the right upper lobe bronchus. 2. There is some patchy ground-glass opacities with small airspace opacity components in the right upper lobe, right middle lobe, right lower lobe, and lingula. Some of these are hypermetabolic, for example lingular opacity has a maximum SUV of 6.2 and the right lower lobe bandlike opacity 5.7. These are probably  inflammatory and merit surveillance. 3. Small to moderate right pleural effusion with accentuated metabolic activity. Malignant effusion not excluded. 4. No findings of metastatic disease to the neck, abdomen/pelvis, or skeleton. 5. Aortic aneurysm NOS. Infrarenal aortic aneurysm 3.8 cm in diameter. Recommend followup by [place] in 2 years. 6. Other imaging findings of potential clinical significance: Chronic paranasal sinusitis. Aortic Atherosclerosis. Coronary atherosclerosis. Hepatic cirrhosis. Ventral hernia containing a loop of small bowel. Suspected late phase healing of the right anterior eighth rib.   09/18/18 CT Chest revealed Rarely relatively stable perihilar consolidation on the RIGHT consistent post treatment change. Persistent narrowing of the proximal bronchi. 2. Interval increase in interstitial thickening in the RIGHT upper lobe. Differential include postobstructive process versus lymphangitic carcinoma. Favor postobstructive process. 3. Interval increase in RIGHT pleural effusion. 4. With potential progressive malignant findings versus progressive benign change, consider follow-up FDG PET scan.   10/02/18 Cytology from thoracentesis showed no malignant cells. 10/02/18 CXR revealed No evidence of pneumothorax after RIGHT thoracentesis. 2. Persistent large, loculated RIGHT pleural effusion and associated dense passive atelectasis in the RIGHT LOWER LOBE. 3. No new abnormalities.  10/29/18 CT Chest revealed Interval decrease in right pleural  effusion with residual loculated component seen anteriorly and medially. 2. The interstitial and airspace disease in the posterior right upper lung is similar to prior. 3. Stable volume loss right hemithorax with right parahilar interstitial and airspace disease likely reflecting radiation fibrosis. 4. Nodular liver morphology suggests cirrhosis.  S/p 5 cycles of Durvalumab  03/06/19 CT Chest revealed "Stable appearing extensive radiation changes and treated tumor in the right upper lobe and right paramediastinal lung and right hilum. I do not see any definite CT findings for progressive disease. 2. Stable loculated right pleural fluid collections. 3. No CT findings to suggest metastatic pulmonary nodules in the left lung or metastatic disease involving the upper abdomen. 4. Stable cirrhotic changes involving the liver. Aortic Atherosclerosis and Emphysema."  11/24/18 ECHO revealed LV EF of 50-55%  2) RUL airway obstruction with RUL lung collapse.  3) Concern for severe narrowing of the right main pulmonary artery as well as right upper lobe and right middle lobe arteries.   4.  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 -Discussed that the fluid cell count and cytology did not suggest an infectious or malignant etiology  -Advised to monitor salt intake. -Advised to follow up with PCP for thyroid monitoring  and diuretics -Clinic visit in 3 month with labs -Discussed refill for pain medication for back pain    FOLLOW UP: RTC with Dr Irene Limbo with labs in 3 months Continue f/u with PCP for optimization of diuretics and mx of pleural effusion likely related to CHF and cirrhosis   The total time spent in the appt was 20  minutes and more than 50% was on counseling and direct patient cares.  All of the patient's questions were answered with apparent satisfaction. The patient knows to call the clinic with any problems, questions or concerns.   Sullivan Lone MD [place] AAHIVMS [facility] Hematology/Oncology Physician [facility]  (Office):       [phone] (Work cell):  [phone] (Fax):           [phone]   I, Scot Dock, am acting as a scribe for Dr. Sullivan Lone.   .I have reviewed the above documentation for accuracy and completeness, and I agree with the above. Brunetta Genera MD

## 2019-08-19 ENCOUNTER — Telehealth: Payer: Self-pay | Admitting: Hematology

## 2019-08-19 ENCOUNTER — Inpatient Hospital Stay (HOSPITAL_BASED_OUTPATIENT_CLINIC_OR_DEPARTMENT_OTHER): Payer: Medicare HMO | Admitting: Hematology

## 2019-08-19 DIAGNOSIS — C3491 Malignant neoplasm of unspecified part of right bronchus or lung: Secondary | ICD-10-CM

## 2019-08-19 DIAGNOSIS — J9819 Other pulmonary collapse: Secondary | ICD-10-CM | POA: Diagnosis not present

## 2019-08-19 DIAGNOSIS — E039 Hypothyroidism, unspecified: Secondary | ICD-10-CM

## 2019-08-19 DIAGNOSIS — C3490 Malignant neoplasm of unspecified part of unspecified bronchus or lung: Secondary | ICD-10-CM

## 2019-08-19 DIAGNOSIS — C3400 Malignant neoplasm of unspecified main bronchus: Secondary | ICD-10-CM

## 2019-08-19 DIAGNOSIS — C3401 Malignant neoplasm of right main bronchus: Secondary | ICD-10-CM | POA: Diagnosis not present

## 2019-08-19 DIAGNOSIS — J90 Pleural effusion, not elsewhere classified: Secondary | ICD-10-CM | POA: Diagnosis not present

## 2019-08-19 MED ORDER — HYDROCODONE-ACETAMINOPHEN 5-325 MG PO TABS: ORAL_TABLET | ORAL | 0 refills | Status: DC

## 2019-08-19 NOTE — Telephone Encounter (Signed)
Scheduled appt per 11/18 los.  Left a vm of the appt date and time.

## 2019-08-21 ENCOUNTER — Other Ambulatory Visit: Payer: Self-pay

## 2019-08-21 ENCOUNTER — Ambulatory Visit: Payer: Medicare HMO | Admitting: Podiatry

## 2019-08-21 ENCOUNTER — Encounter: Payer: Self-pay | Admitting: Podiatry

## 2019-08-21 DIAGNOSIS — M79676 Pain in unspecified toe(s): Secondary | ICD-10-CM | POA: Diagnosis not present

## 2019-08-21 DIAGNOSIS — E119 Type 2 diabetes mellitus without complications: Secondary | ICD-10-CM | POA: Diagnosis not present

## 2019-08-21 DIAGNOSIS — B351 Tinea unguium: Secondary | ICD-10-CM | POA: Diagnosis not present

## 2019-08-21 NOTE — Progress Notes (Signed)
Patient ID: Barry Booker, male   DOB: [birth year], 73 y.o.   MRN: [id number] Complaint:  Visit Type: Patient returns to my office for continued preventative foot care services. Complaint: Patient states" my nails have grown long and thick and become painful to walk and wear shoes" . The patient presents for preventative foot care services. No changes to ROS  Podiatric Exam: Vascular: dorsalis pedis and posterior tibial pulses are palpable bilateral. Capillary return is immediate. Temperature gradient is WNL. Skin turgor WNL  Sensorium: Normal Semmes Weinstein monofilament test. Normal tactile sensation bilaterally. Nail Exam: Pt has thick disfigured discolored nails with subungual debris noted bilateral entire nail hallux through fifth toenails Ulcer Exam: There is no evidence of ulcer or pre-ulcerative changes or infection. Orthopedic Exam: Muscle tone and strength are WNL. No limitations in general ROM. No crepitus or effusions noted. Foot type and digits show no abnormalities. Bony prominences are unremarkable. Skin: No Porokeratosis. No infection or ulcers  Diagnosis:  Onychomycosis, , Pain in right toe, pain in left toes  Treatment & Plan Procedures and Treatment: Consent by patient was obtained for treatment procedures. The patient understood the discussion of treatment and procedures well. All questions were answered thoroughly reviewed. Debridement of mycotic and hypertrophic toenails, 1 through 5 bilateral and clearing of subungual debris. No ulceration, no infection noted.  Return Visit-Office Procedure: Patient instructed to return to the office for a follow up visit 3 months for continued evaluation and treatment.  Gardiner Barefoot DPM

## 2019-09-18 ENCOUNTER — Other Ambulatory Visit: Payer: Self-pay | Admitting: *Deleted

## 2019-09-18 NOTE — Telephone Encounter (Signed)
Called to request refill of pain medication

## 2019-09-21 MED ORDER — HYDROCODONE-ACETAMINOPHEN 5-325 MG PO TABS: ORAL_TABLET | ORAL | 0 refills | Status: DC

## 2019-10-23 ENCOUNTER — Other Ambulatory Visit: Payer: Self-pay | Admitting: *Deleted

## 2019-10-23 MED ORDER — HYDROCODONE-ACETAMINOPHEN 5-325 MG PO TABS: ORAL_TABLET | ORAL | 0 refills | Status: DC

## 2019-10-26 ENCOUNTER — Other Ambulatory Visit: Payer: Self-pay | Admitting: Hematology

## 2019-11-13 ENCOUNTER — Other Ambulatory Visit: Payer: Self-pay | Admitting: Physician Assistant

## 2019-11-17 ENCOUNTER — Telehealth: Payer: Self-pay | Admitting: *Deleted

## 2019-11-17 NOTE — Telephone Encounter (Signed)
Patient's wife called - she wants to r/s lab and Dr. Irene Limbo appts for patient currently on 2/18 due to potential bad weather and r/s in earliest appt available. Appts on 11/19/19 cancelled  - schedule message sent

## 2019-11-19 ENCOUNTER — Ambulatory Visit: Payer: Medicare HMO | Admitting: Hematology

## 2019-11-19 ENCOUNTER — Other Ambulatory Visit: Payer: Medicare HMO

## 2019-11-20 ENCOUNTER — Telehealth: Payer: Self-pay | Admitting: Hematology

## 2019-11-20 NOTE — Telephone Encounter (Signed)
Per 2/16 sch msg rescheduled pt's appts from 2/18. Left voicemail with pt's new appt details.

## 2019-11-21 ENCOUNTER — Ambulatory Visit: Payer: Medicare HMO | Attending: Internal Medicine

## 2019-11-21 DIAGNOSIS — Z23 Encounter for immunization: Secondary | ICD-10-CM | POA: Insufficient documentation

## 2019-11-21 NOTE — Progress Notes (Signed)
   Covid-19 Vaccination Clinic  Name:  Barry Booker    MRN: [id number] DOB: [birth year]  11/21/2019  [name] was observed post Covid-19 immunization for 15 minutes without incidence. He was provided with Vaccine Information Sheet and instruction to access the V-Safe system.   [name] was instructed to call 911 with any severe reactions post vaccine: [name] Difficulty breathing  . Swelling of your face and throat  . A fast heartbeat  . A bad rash all over your body  . Dizziness and weakness    Immunizations Administered    Name Date Dose VIS Date Route   Pfizer COVID-19 Vaccine 11/21/2019  9:13 AM 0.3 mL 09/11/2019 Intramuscular   Manufacturer: [place]   Lot: [id number]   [place]: [id number]

## 2019-11-24 ENCOUNTER — Other Ambulatory Visit: Payer: Self-pay

## 2019-11-24 ENCOUNTER — Encounter: Payer: Self-pay | Admitting: Podiatry

## 2019-11-24 ENCOUNTER — Ambulatory Visit (INDEPENDENT_AMBULATORY_CARE_PROVIDER_SITE_OTHER): Payer: Medicaid Other | Admitting: Podiatry

## 2019-11-24 VITALS — Temp 97.0°F

## 2019-11-24 DIAGNOSIS — M79676 Pain in unspecified toe(s): Secondary | ICD-10-CM

## 2019-11-24 DIAGNOSIS — E119 Type 2 diabetes mellitus without complications: Secondary | ICD-10-CM

## 2019-11-24 DIAGNOSIS — B351 Tinea unguium: Secondary | ICD-10-CM

## 2019-11-24 NOTE — Progress Notes (Signed)
Patient ID: Barry Booker, male   DOB: 12-Mar-1946, 74 y.o.   MRN: [id number] Complaint:  Visit Type: Patient returns to my office for continued preventative foot care services. Complaint: Patient states" my nails have grown long and thick and become painful to walk and wear shoes" . The patient presents for preventative foot care services. No changes to ROS  Podiatric Exam: Vascular: dorsalis pedis and posterior tibial pulses are palpable bilateral. Capillary return is immediate. Temperature gradient is WNL. Skin turgor WNL  Sensorium: Normal Semmes Weinstein monofilament test. Normal tactile sensation bilaterally. Nail Exam: Pt has thick disfigured discolored nails with subungual debris noted bilateral entire nail hallux through fifth toenails Ulcer Exam: There is no evidence of ulcer or pre-ulcerative changes or infection. Orthopedic Exam: Muscle tone and strength are WNL. No limitations in general ROM. No crepitus or effusions noted. Foot type and digits show no abnormalities. Bony prominences are unremarkable. Skin: No Porokeratosis. No infection or ulcers  Diagnosis:  Onychomycosis, , Pain in right toe, pain in left toes  Treatment & Plan Procedures and Treatment: Consent by patient was obtained for treatment procedures. The patient understood the discussion of treatment and procedures well. All questions were answered thoroughly reviewed. Debridement of mycotic and hypertrophic toenails, 1 through 5 bilateral and clearing of subungual debris. No ulceration, no infection noted. Visual inspection of the foot was performed. Return Visit-Office Procedure: Patient instructed to return to the office for a follow up visit 3 months for continued evaluation and treatment.  Gardiner Barefoot DPM

## 2019-11-26 ENCOUNTER — Other Ambulatory Visit: Payer: Self-pay | Admitting: *Deleted

## 2019-11-26 DIAGNOSIS — C3490 Malignant neoplasm of unspecified part of unspecified bronchus or lung: Secondary | ICD-10-CM

## 2019-11-26 MED ORDER — HYDROCODONE-ACETAMINOPHEN 5-325 MG PO TABS: ORAL_TABLET | ORAL | 0 refills | Status: DC

## 2019-11-26 NOTE — Telephone Encounter (Signed)
Requested refill of pain medication

## 2019-11-30 ENCOUNTER — Inpatient Hospital Stay: Payer: Medicare HMO | Attending: Hematology

## 2019-11-30 ENCOUNTER — Encounter: Payer: Self-pay | Admitting: Hematology

## 2019-11-30 ENCOUNTER — Inpatient Hospital Stay (HOSPITAL_BASED_OUTPATIENT_CLINIC_OR_DEPARTMENT_OTHER): Payer: Medicare HMO | Admitting: Hematology

## 2019-11-30 ENCOUNTER — Other Ambulatory Visit: Payer: Self-pay

## 2019-11-30 VITALS — BP 113/84 | HR 105 | Temp 98.2°F | Resp 18 | Ht 70.0 in | Wt 243.0 lb

## 2019-11-30 DIAGNOSIS — C3491 Malignant neoplasm of unspecified part of right bronchus or lung: Secondary | ICD-10-CM

## 2019-11-30 DIAGNOSIS — E039 Hypothyroidism, unspecified: Secondary | ICD-10-CM | POA: Diagnosis not present

## 2019-11-30 DIAGNOSIS — Z85118 Personal history of other malignant neoplasm of bronchus and lung: Secondary | ICD-10-CM | POA: Insufficient documentation

## 2019-11-30 DIAGNOSIS — C3490 Malignant neoplasm of unspecified part of unspecified bronchus or lung: Secondary | ICD-10-CM

## 2019-11-30 DIAGNOSIS — C3400 Malignant neoplasm of unspecified main bronchus: Secondary | ICD-10-CM

## 2019-11-30 DIAGNOSIS — R2689 Other abnormalities of gait and mobility: Secondary | ICD-10-CM

## 2019-11-30 DIAGNOSIS — J90 Pleural effusion, not elsewhere classified: Secondary | ICD-10-CM

## 2019-11-30 LAB — CBC WITH DIFFERENTIAL/PLATELET
Abs Immature Granulocytes: 0.06 10*3/uL (ref 0.00–0.07)
Basophils Absolute: 0 10*3/uL (ref 0.0–0.1)
Basophils Relative: 0 %
Eosinophils Absolute: 0.1 10*3/uL (ref 0.0–0.5)
Eosinophils Relative: 1 %
HCT: 41.5 % (ref 39.0–52.0)
Hemoglobin: 13 g/dL (ref 13.0–17.0)
Immature Granulocytes: 1 %
Lymphocytes Relative: 8 %
Lymphs Abs: 0.9 10*3/uL (ref 0.7–4.0)
MCH: 28.5 pg (ref 26.0–34.0)
MCHC: 31.3 g/dL (ref 30.0–36.0)
MCV: 91 fL (ref 80.0–100.0)
Monocytes Absolute: 1 10*3/uL (ref 0.1–1.0)
Monocytes Relative: 9 %
Neutro Abs: 8.7 10*3/uL — ABNORMAL HIGH (ref 1.7–7.7)
Neutrophils Relative %: 81 %
Platelets: 172 10*3/uL (ref 150–400)
RBC: 4.56 MIL/uL (ref 4.22–5.81)
RDW: 14.3 % (ref 11.5–15.5)
WBC: 10.9 10*3/uL — ABNORMAL HIGH (ref 4.0–10.5)
nRBC: 0 % (ref 0.0–0.2)

## 2019-11-30 LAB — CMP (CANCER CENTER ONLY)
ALT: 8 U/L (ref 0–44)
AST: 12 U/L — ABNORMAL LOW (ref 15–41)
Albumin: 3.5 g/dL (ref 3.5–5.0)
Alkaline Phosphatase: 79 U/L (ref 38–126)
Anion gap: 9 (ref 5–15)
BUN: 17 mg/dL (ref 8–23)
CO2: 27 mmol/L (ref 22–32)
Calcium: 9.4 mg/dL (ref 8.9–10.3)
Chloride: 104 mmol/L (ref 98–111)
Creatinine: 1.1 mg/dL (ref 0.61–1.24)
GFR, Est AFR Am: >60 mL/min (ref >60)
GFR, Estimated: >60 mL/min (ref >60)
Glucose, Bld: 123 mg/dL — ABNORMAL HIGH (ref 70–99)
Potassium: 4.3 mmol/L (ref 3.5–5.1)
Sodium: 140 mmol/L (ref 135–145)
Total Bilirubin: 0.5 mg/dL (ref 0.3–1.2)
Total Protein: 8 g/dL (ref 6.5–8.1)

## 2019-11-30 LAB — TSH: TSH: 3.286 u[IU]/mL (ref 0.320–4.118)

## 2019-11-30 NOTE — Progress Notes (Signed)
HEMATOLOGY/ONCOLOGY CLINIC NOTE  Date of Service: 11/30/2019  Patient Care Team: Bernerd Limbo, MD as PCP - General (Family Medicine) Sherren Mocha, MD as PCP - Cardiology (Cardiology)  CHIEF COMPLAINTS/PURPOSE OF CONSULTATION:  F/u for mx of lung cancer  HISTORY OF PRESENTING ILLNESS:   Barry Booker is a wonderful 74 y.o. male who has been referred to [place] by Dr Marshell Garfinkel for evaluation and management of presumed newly diagnosed rt sided lung cancer with concern for bronchial obstruction.  Patient has a history of hypertension, dyslipidemia, diabetes, COPD, alcohol abuse and previously had a chest x-ray on 02/20/2018 at [facility] which showed Right upper lobe consolidation/atelectasis may relate to post obstructive pneumonitis/pneumonia. An underlying obstructing lesion should be considered. Consider CT for further assessment.  H subsequently then had a CT of the chest on 02/27/2018 which showed -5.8 x 4.9 cm mass in the right mediastinum and hilum with associated severe narrowing of the right main pulmonary artery as well as right upper lobe and right middle lobe arteries. Obstructed right main stem bronchus and collapse of the right upper lobe and right middle lobe. Small right effusion. The appearance is similar to the patient's chest radiograph from 02/20/2018.  Patient subsequently had a PET CT on 03/10/2018 by Dr. Vaughan Browner which showed Large intensely hypermetabolic RIGHT hilar mass obstructs the RIGHT upper lobe bronchus and surrounds the bronchus intermedius. Post obstructive collapse of the RIGHT upper lobe.  No hypermetabolic mediastinal or supraclavicular lymph nodes. No evidence distant metastatic disease.  Patient had a flexible video fiberoptic bronchoscopy done on 03/21/2018 by Dr. Vaughan Browner which showed which showed some nodularity of the right mainstem bronchial mucosa with near complete occlusion of right main stem. Unable to traverse the occluded part of right main stem. The  mucosa was friable with propensity to bleed. Endobronchial biopsies and brushings were taken from the R mainstem and right upper lobe for pathology.  Preliminary results showed atypical cells however final cytology/pathology is currently pending.  Patient report rt sided chest pain with deep inspiration, anorexia and weight loss of about 30lbs in the last 6 months.  Interval History:   Barry Booker returns to clinic today for management and evaluation of his lung cancer. We are joined today by his wife. The patient's last visit with [place] was on 08/19/2019. The pt reports that he is doing well overall.  The pt reports that he feels that he is limited due to his balance issues and repeat falls. Barry Booker notes that these issues have been going on for some time. Pt used to have a very physically active job and did not replace his activity when he stopped working. He denies any SOB. The Lasix cause him to urinate frequently but have been controlling his leg swelling well. Pt has continued to smoke but has severely limited his alcohol intake at this time. Pt has been putting in effort to watch his blood glucose levels and limiting sweets.   He has had the first dose of the COVID19 vaccine and is scheduled for the second. Pt has an appointment with Dr. Coletta Memos on 03/25. He does not currently have follow-up with Dr. Tammi Klippel.   Lab results today (11/30/19) of CBC w/diff and CMP is as follows: all values are WNL except for WBC at 10.9K, Neutro Abs at 8.7K, Glucose at 123, AST at 12. 11/30/2019 TSH at 3.286  On review of systems, pt reports balance issues, polyuria, healthy appetite and denies SOB, leg swelling, headaches,  vision changes, increased abdominal fullness and any other symptoms.   MEDICAL HISTORY:  Past Medical History:  Diagnosis Date  . Alcohol abuse   . COPD (chronic obstructive pulmonary disease) ([place])   . Diabetes mellitus without complication ([place])   . Echocardiogram    Echo 11/2018:  EF 45-50, mild MAC, mild calc of AoV (difficult study) >> Limited echo with Definity contrast 11/2018: EF 50-55  . Gout   . High cholesterol   . Hypertension   . Malignant neoplasm of right upper lobe of lung ([place]) 03/29/2018    SURGICAL HISTORY: Past Surgical History:  Procedure Laterality Date  . COLONOSCOPY    . FLEXIBLE BRONCHOSCOPY [name] 03/21/2018   Procedure: FLEXIBLE BRONCHOSCOPY;  Surgeon: Marshell Garfinkel, MD;  Location: [place];  Service: Pulmonary;  Laterality: [name];  . NOSE SURGERY  90's  . VIDEO BRONCHOSCOPY WITH ENDOBRONCHIAL ULTRASOUND [name] 03/21/2018   Procedure: VIDEO BRONCHOSCOPY WITH ENDOBRONCHIAL ULTRASOUND;  Surgeon: Marshell Garfinkel, MD;  Location: [place];  Service: Pulmonary;  Laterality: [name];    SOCIAL HISTORY: Social History   Socioeconomic History  . Marital status: Married    Spouse name: Not on file  . Number of children: Not on file  . Years of education: Not on file  . Highest education level: Not on file  Occupational History  . Not on file  Tobacco Use  . Smoking status: Former Smoker    Packs/day: 0.25    Years: 50.00    Pack years: 12.50    Types: Cigarettes    Quit date: 07/2017    Years since quitting: 2.4  . Smokeless tobacco: Never Used  . Tobacco comment: quit smoking 07/2017  Substance and Sexual Activity  . Alcohol use: Yes    Alcohol/week: 14.0 - 21.0 standard drinks    Types: 14 - 21 Cans of beer per week  . Drug use: No  . Sexual activity: Not on file  Other Topics Concern  . Not on file  Social History Narrative  . Not on file   Social Determinants of Health   Financial Resource Strain:   . Difficulty of Paying Living Expenses: Not on file  Food Insecurity:   . Worried About Charity fundraiser in the Last Year: Not on file  . Ran Out of Food in the Last Year: Not on file  Transportation Needs:   . Lack of Transportation (Medical): Not on file  . Lack of Transportation (Non-Medical): Not on file  Physical Activity:   . Days  of Exercise per Week: Not on file  . Minutes of Exercise per Session: Not on file  Stress:   . Feeling of Stress : Not on file  Social Connections:   . Frequency of Communication with Friends and Family: Not on file  . Frequency of Social Gatherings with Friends and Family: Not on file  . Attends Religious Services: Not on file  . Active Member of Clubs or Organizations: Not on file  . Attends Archivist Meetings: Not on file  . Marital Status: Not on file  Intimate Partner Violence:   . Fear of Current or Ex-Partner: Not on file  . Emotionally Abused: Not on file  . Physically Abused: Not on file  . Sexually Abused: Not on file    FAMILY HISTORY: Family History  Problem Relation Age of Onset  . Arthritis Mother   . Cancer Father   . Cancer Sister   . Heart disease Maternal Uncle   .  Colon cancer Neg Hx     ALLERGIES:  is allergic to neomycin-bacitracin zn-polymyx.  MEDICATIONS:  Current Outpatient Medications  Medication Sig Dispense Refill  . ANORO ELLIPTA 62.5-25 MCG/INH AEPB INHALE 1 PUFF INTO THE LUNGS DAILY 60 each 1  . aspirin 81 MG tablet Take 81 mg by mouth daily.    [name] atorvastatin (LIPITOR) 40 MG tablet Take 40 mg by mouth daily.     . benazepril (LOTENSIN) 20 MG tablet Take 20 mg by mouth daily.     . diphenhydrAMINE (BENADRYL) 12.5 MG/5ML elixir Take 12.5 mg by mouth daily as needed for allergies.     . folic acid (FOLVITE) 712 MCG tablet Take 400 mcg by mouth daily.    . furosemide (LASIX) 40 MG tablet Take 40 mg by mouth daily.     [name] HYDROcodone-acetaminophen (NORCO) 5-325 MG tablet 1/2 to 1 tablet 4 times daily as needed for pain. 60 tablet 0  . ibuprofen (ADVIL,MOTRIN) 200 MG tablet Take 400 mg by mouth every 6 (six) hours as needed for headache, mild pain or moderate pain.    [name] levothyroxine (SYNTHROID) 25 MCG tablet TAKE 1 TABLET BY MOUTH DAILY BEFORE BREAKFAST. 90 tablet 1  . metFORMIN (GLUCOPHAGE) 500 MG tablet Take 500 mg by mouth every  morning.    . metoprolol succinate (TOPROL-XL) 25 MG 24 hr tablet Take 1 tablet (25 mg total) by mouth daily. Please make overdue appt with Dr. Burt Knack before anymore refills. 1st attempt 30 tablet 0  . potassium chloride SA (K-DUR) 20 MEQ tablet Take 20 mEq by mouth daily.    . prochlorperazine (COMPAZINE) 10 MG tablet Take 10 mg by mouth every 6 (six) hours as needed for nausea or vomiting.     [name] UNABLE TO FIND Take by mouth.    . VENTOLIN HFA 108 (90 Base) MCG/ACT inhaler INHALE 1 TO 2 PUFFS INTO THE LUNGS EVERY 4 HOURS AS NEEDED FOR WHEEZING OR SHORTNESS OF BREATH 6.7 g 2   No current facility-administered medications for this visit.    REVIEW OF SYSTEMS:   A 10+ POINT REVIEW OF SYSTEMS WAS OBTAINED including neurology, dermatology, psychiatry, cardiac, respiratory, lymph, extremities, GI, GU, Musculoskeletal, constitutional, breasts, reproductive, HEENT.  All pertinent positives are noted in the HPI.  All others are negative.   PHYSICAL EXAMINATION: There were no vitals filed for this visit. Wt Readings from Last 3 Encounters:  08/05/19 242 lb 8 oz (110 kg)  05/13/19 235 lb 6.4 oz (106.8 kg)  03/13/19 222 lb 14.4 oz (101.1 kg)   There is no height or weight on file to calculate BMI.    Exam was given in a chair   GENERAL:alert, in no acute distress and comfortable SKIN: no acute rashes, no significant lesions EYES: conjunctiva are pink and non-injected, sclera anicteric OROPHARYNX: MMM, no exudates, no oropharyngeal erythema or ulceration NECK: supple, no JVD LYMPH:  no palpable lymphadenopathy in the cervical, axillary or inguinal regions LUNGS: clear to auscultation b/l with normal respiratory effort HEART: regular rate & rhythm ABDOMEN:  normoactive bowel sounds , non tender, not distended. No palpable hepatosplenomegaly.  Extremity: trace pedal edema b/l PSYCH: alert & oriented x 3 with fluent speech NEURO: no focal motor/sensory deficits  LABORATORY DATA:  I have  reviewed the data as listed . CBC Latest Ref Rng & Units 08/05/2019 03/13/2019 01/30/2019  WBC 4.0 - 10.5 K/uL 7.3 9.5 10.3  Hemoglobin 13.0 - 17.0 g/dL 13.0 12.0(L) 12.0(L)  Hematocrit 39.0 - 52.0 %  40.1 38.4(L) 35.0(L)  Platelets 150 - 400 K/uL 146(L) 178 161    . CMP Latest Ref Rng & Units 08/05/2019 03/13/2019 01/30/2019  Glucose 70 - 99 mg/dL 153(H) 172(H) 504(HH)  BUN 8 - 23 mg/dL 19 19 28(H)  Creatinine 0.61 - 1.24 mg/dL 1.11 1.20 1.30(H)  Sodium 135 - 145 mmol/L 139 140 133(L)  Potassium 3.5 - 5.1 mmol/L 4.2 4.4 4.9  Chloride 98 - 111 mmol/L 102 103 99  CO2 22 - 32 mmol/L _0 Calcium 8.9 - 10.3 mg/dL 9.7 9.8 9.5  Total Protein 6.5 - 8.1 g/dL 7.7 8.1 8.0  Total Bilirubin 0.3 - 1.2 mg/dL 0.3 0.3 0.7  Alkaline Phos 38 - 126 U/L 62 67 78  AST 15 - 41 U/L 20 14(L) 16  ALT 0 - 44 U/L _1 08/10/2019  08/10/2019 Cytology- PAP  03/25/18 Endobronchial bx:     RADIOGRAPHIC STUDIES: I have personally reviewed the radiological images as listed and agreed with the findings in the report. No results found.  ASSESSMENT & PLAN:   74 y.o.  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 Post obstructive collapse of the Right upper lobe. No hypermetabolic mediastinal or supraclavicular lymph nodes. No evidence of distant mets.  06/23/18 PET/CT revealed  Marked reduction in activity associated with the dominant right upper lobe central mass, maximum SUV 3.4 and previously 15.3. Please note that this lesion does still in case and markedly narrows the right upper lobe bronchus. 2. There is some patchy ground-glass opacities with small  airspace opacity components in the right upper lobe, right middle lobe, right lower lobe, and lingula. Some of these are hypermetabolic, for example lingular opacity has a maximum SUV of 6.2 and the right lower lobe bandlike opacity 5.7. These are probably inflammatory and merit surveillance. 3. Small to moderate right pleural effusion with accentuated metabolic activity. Malignant effusion not excluded. 4. No findings of metastatic disease to the neck, abdomen/pelvis, or skeleton. 5. Aortic aneurysm NOS. Infrarenal aortic aneurysm 3.8 cm in diameter. Recommend followup by [place] in 2 years. 6. Other imaging findings of potential clinical significance: Chronic paranasal sinusitis. Aortic Atherosclerosis. Coronary atherosclerosis. Hepatic cirrhosis. Ventral hernia containing a loop of small bowel. Suspected late phase healing of the right anterior eighth rib.   09/18/18 CT Chest revealed Rarely relatively stable perihilar consolidation on the RIGHT consistent post treatment change. Persistent narrowing of the proximal bronchi. 2. Interval increase in interstitial thickening in the RIGHT upper lobe. Differential include postobstructive process versus lymphangitic carcinoma. Favor postobstructive process. 3. Interval increase in RIGHT pleural effusion. 4. With potential progressive malignant findings versus progressive benign change, consider follow-up FDG PET scan.   10/02/18 Cytology from thoracentesis showed no malignant cells. 10/02/18 CXR revealed No evidence of pneumothorax after RIGHT thoracentesis. 2. Persistent large, loculated RIGHT pleural effusion and associated dense passive atelectasis in the RIGHT LOWER LOBE. 3. No new abnormalities.  10/29/18 CT Chest revealed Interval decrease in right pleural effusion with residual loculated component seen anteriorly and medially. 2. The interstitial and airspace disease in the posterior right upper lung is similar to prior. 3. Stable volume loss right hemithorax with  right parahilar interstitial  and airspace disease likely reflecting radiation fibrosis. 4. Nodular liver morphology suggests cirrhosis.  S/p 5 cycles of Durvalumab  03/06/19 CT Chest revealed "Stable appearing extensive radiation changes and treated tumor in the right upper lobe and right paramediastinal lung and right hilum. I do not see any definite CT findings for progressive disease. 2. Stable loculated right pleural fluid collections. 3. No CT findings to suggest metastatic pulmonary nodules in the left lung or metastatic disease involving the upper abdomen. 4. Stable cirrhotic changes involving the liver. Aortic Atherosclerosis and Emphysema."  11/24/18 ECHO revealed LV EF of 50-55%  2) RUL airway obstruction with RUL lung collapse.  3) Concern for severe narrowing of the right main pulmonary artery as well as right upper lobe and right middle lobe arteries.   4.  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 Coletta Memos as scheduled and discuss PT referral  -Will see back in 6 weeks with labs    FOLLOW UP: CT head and CT chest/abd/pelvis in 4 weeks and  RTC with Dr Irene Limbo in 6 weeks   The total time spent in the appt was 30 minutes and more than 50% was on counseling and direct patient cares.  All of the patient's questions were answered with apparent satisfaction. The patient knows to call the clinic  with any problems, questions or concerns.   Sullivan Lone MD [place] AAHIVMS [facility] Hematology/Oncology Physician [facility]  (Office):       [phone] (Work cell):  [phone] (Fax):           [phone]   I, Yevette Edwards, am acting as a scribe for Dr. Sullivan Lone.   .I have reviewed the above documentation for accuracy and completeness, and I agree with the above. Brunetta Genera MD

## 2019-12-02 ENCOUNTER — Telehealth: Payer: Self-pay | Admitting: Hematology

## 2019-12-02 NOTE — Telephone Encounter (Signed)
Scheduled per 03/01 los, called patient and left a voicemail.

## 2019-12-06 ENCOUNTER — Other Ambulatory Visit: Payer: Self-pay | Admitting: Physician Assistant

## 2019-12-15 ENCOUNTER — Ambulatory Visit: Payer: Medicare HMO | Attending: Internal Medicine

## 2019-12-15 DIAGNOSIS — Z23 Encounter for immunization: Secondary | ICD-10-CM

## 2019-12-15 NOTE — Progress Notes (Signed)
   Covid-19 Vaccination Clinic  Name:  Barry Booker    MRN: [id number] DOB: 1945/11/21  12/15/2019  [name] was observed post Covid-19 immunization for 15 minutes without incident. He was provided with Vaccine Information Sheet and instruction to access the V-Safe system.   [name] was instructed to call 911 with any severe reactions post vaccine: [name] Difficulty breathing  . Swelling of face and throat  . A fast heartbeat  . A bad rash all over body  . Dizziness and weakness   Immunizations Administered    Name Date Dose VIS Date Route   Pfizer COVID-19 Vaccine 12/15/2019  9:51 AM 0.3 mL 09/11/2019 Intramuscular   Manufacturer: [place]   Lot: [id number]   [place]: [address]-1

## 2019-12-16 ENCOUNTER — Ambulatory Visit: Payer: Medicare HMO | Admitting: Hematology

## 2019-12-16 ENCOUNTER — Inpatient Hospital Stay: Payer: Medicare HMO

## 2019-12-18 ENCOUNTER — Other Ambulatory Visit: Payer: Self-pay | Admitting: Physician Assistant

## 2019-12-29 ENCOUNTER — Other Ambulatory Visit: Payer: Self-pay | Admitting: *Deleted

## 2019-12-29 DIAGNOSIS — C3490 Malignant neoplasm of unspecified part of unspecified bronchus or lung: Secondary | ICD-10-CM

## 2019-12-29 NOTE — Telephone Encounter (Signed)
Patient/wife requested refill of pain medication

## 2019-12-30 MED ORDER — HYDROCODONE-ACETAMINOPHEN 5-325 MG PO TABS: ORAL_TABLET | ORAL | 0 refills | Status: DC

## 2019-12-31 ENCOUNTER — Encounter (HOSPITAL_COMMUNITY): Payer: Self-pay

## 2019-12-31 ENCOUNTER — Ambulatory Visit (HOSPITAL_COMMUNITY)
Admission: RE | Admit: 2019-12-31 | Discharge: 2019-12-31 | Disposition: A | Discharge status: Home / Self Care | Payer: Medicare HMO | Source: Ambulatory Visit | Attending: Hematology | Admitting: Hematology

## 2019-12-31 ENCOUNTER — Other Ambulatory Visit: Payer: Self-pay

## 2019-12-31 DIAGNOSIS — C3491 Malignant neoplasm of unspecified part of right bronchus or lung: Secondary | ICD-10-CM | POA: Insufficient documentation

## 2019-12-31 DIAGNOSIS — C3490 Malignant neoplasm of unspecified part of unspecified bronchus or lung: Secondary | ICD-10-CM | POA: Insufficient documentation

## 2019-12-31 DIAGNOSIS — R2689 Other abnormalities of gait and mobility: Secondary | ICD-10-CM | POA: Diagnosis present

## 2019-12-31 DIAGNOSIS — J90 Pleural effusion, not elsewhere classified: Secondary | ICD-10-CM | POA: Insufficient documentation

## 2019-12-31 MED ORDER — SODIUM CHLORIDE (PF) 0.9 % IJ SOLN
INTRAMUSCULAR | Status: AC
  Filled 2019-12-31: qty 50

## 2019-12-31 MED ORDER — IOHEXOL 300 MG/ML  SOLN
100.0000 mL | Freq: Once | INTRAMUSCULAR | Status: AC | PRN
  Administered 2019-12-31: 10:00:00 100 mL via INTRAVENOUS

## 2020-01-07 ENCOUNTER — Other Ambulatory Visit: Payer: Self-pay | Admitting: Hematology

## 2020-01-07 ENCOUNTER — Other Ambulatory Visit: Payer: Self-pay | Admitting: Physician Assistant

## 2020-01-11 ENCOUNTER — Inpatient Hospital Stay: Payer: Medicare HMO | Attending: Hematology | Admitting: Hematology

## 2020-01-11 ENCOUNTER — Other Ambulatory Visit: Payer: Self-pay

## 2020-01-11 VITALS — BP 106/69 | HR 102 | Temp 98.5°F | Resp 18 | Ht 70.0 in | Wt 244.1 lb

## 2020-01-11 DIAGNOSIS — R5383 Other fatigue: Secondary | ICD-10-CM

## 2020-01-11 DIAGNOSIS — E039 Hypothyroidism, unspecified: Secondary | ICD-10-CM | POA: Insufficient documentation

## 2020-01-11 DIAGNOSIS — C3491 Malignant neoplasm of unspecified part of right bronchus or lung: Secondary | ICD-10-CM | POA: Diagnosis not present

## 2020-01-11 DIAGNOSIS — Z85118 Personal history of other malignant neoplasm of bronchus and lung: Secondary | ICD-10-CM | POA: Diagnosis present

## 2020-01-11 NOTE — Progress Notes (Signed)
HEMATOLOGY/ONCOLOGY CLINIC NOTE  Date of Service: 01/11/2020  Patient Care Team: Bernerd Limbo, MD as PCP - General (Family Medicine) Sherren Mocha, MD as PCP - Cardiology (Cardiology)  CHIEF COMPLAINTS/PURPOSE OF CONSULTATION:  F/u for mx of lung cancer  HISTORY OF PRESENTING ILLNESS:   Barry Booker is a wonderful 74 y.o. male who has been referred to [place] by Dr Marshell Garfinkel for evaluation and management of presumed newly diagnosed rt sided lung cancer with concern for bronchial obstruction.  Patient has a history of hypertension, dyslipidemia, diabetes, COPD, alcohol abuse and previously had a chest x-ray on 02/20/2018 at [facility] which showed Right upper lobe consolidation/atelectasis may relate to post obstructive pneumonitis/pneumonia. An underlying obstructing lesion should be considered. Consider CT for further assessment.  H subsequently then had a CT of the chest on 02/27/2018 which showed -5.8 x 4.9 cm mass in the right mediastinum and hilum with associated severe narrowing of the right main pulmonary artery as well as right upper lobe and right middle lobe arteries. Obstructed right main stem bronchus and collapse of the right upper lobe and right middle lobe. Small right effusion. The appearance is similar to the patient's chest radiograph from 02/20/2018.  Patient subsequently had a PET CT on 03/10/2018 by Dr. Vaughan Browner which showed Large intensely hypermetabolic RIGHT hilar mass obstructs the RIGHT upper lobe bronchus and surrounds the bronchus intermedius. Post obstructive collapse of the RIGHT upper lobe.  No hypermetabolic mediastinal or supraclavicular lymph nodes. No evidence distant metastatic disease.  Patient had a flexible video fiberoptic bronchoscopy done on 03/21/2018 by Dr. Vaughan Browner which showed which showed some nodularity of the right mainstem bronchial mucosa with near complete occlusion of right main stem. Unable to traverse the occluded part of right main stem. The  mucosa was friable with propensity to bleed. Endobronchial biopsies and brushings were taken from the R mainstem and right upper lobe for pathology.  Preliminary results showed atypical cells however final cytology/pathology is currently pending.  Patient report rt sided chest pain with deep inspiration, anorexia and weight loss of about 30lbs in the last 6 months.  Interval History:   Barry Booker returns to clinic today for management and evaluation of his lung cancer. We are joined today by his wife. The patient's last visit with [place] was on 11/30/2019. The pt reports that he is doing well overall.  The pt reports that he has had no leg swelling but is still having significant weakness in his legs. His imbalance has improved since our last meeting. Dr. Coletta Memos is working to set up home PT for the pt. Pt has continued taking Synthroid as prescribed. His dose was recently increased to 75 mg by Dr. Coletta Memos. Pt still smokes 4-5 cigarettes per day. Pt has been having some congestion from pollen outside and dust in his home.   Of note since the patient's last visit, pt has had CT C/A/P  ([id number]) ([id number]) completed on 12/31/2019 with results revealing "1. Stable appearance of treated tumor within the right perihilar and perihilar right lung. No specific findings identified to suggest local tumor recurrence. 2. Decrease in size of loculated right pleural effusion. 3. Cirrhosis. 4. Infrarenal abdominal aortic aneurysm."  Pt has had Head CT ([id number]) completed on 12/31/2019 with results revealing "No acute intracranial hemorrhage, edema, or evidence of acute infarction. Suboptimal evaluation for intracranial metastatic disease."  On review of systems, pt reports leg weakness, rhinitis and denies leg swelling, imbalance and any other symptoms.  MEDICAL HISTORY:  Past Medical History:  Diagnosis Date  . Alcohol abuse   . COPD (chronic obstructive pulmonary disease) ([place])   . Diabetes mellitus  without complication ([place])   . Echocardiogram    Echo 11/2018: EF 45-50, mild MAC, mild calc of AoV (difficult study) >> Limited echo with Definity contrast 11/2018: EF 50-55  . Gout   . High cholesterol   . Hypertension   . Malignant neoplasm of right upper lobe of lung ([place]) 03/29/2018    SURGICAL HISTORY: Past Surgical History:  Procedure Laterality Date  . COLONOSCOPY    . FLEXIBLE BRONCHOSCOPY [name] 03/21/2018   Procedure: FLEXIBLE BRONCHOSCOPY;  Surgeon: Marshell Garfinkel, MD;  Location: [place];  Service: Pulmonary;  Laterality: [name];  . NOSE SURGERY  90's  . VIDEO BRONCHOSCOPY WITH ENDOBRONCHIAL ULTRASOUND [name] 03/21/2018   Procedure: VIDEO BRONCHOSCOPY WITH ENDOBRONCHIAL ULTRASOUND;  Surgeon: Marshell Garfinkel, MD;  Location: [place];  Service: Pulmonary;  Laterality: [name];    SOCIAL HISTORY: Social History   Socioeconomic History  . Marital status: Married    Spouse name: Not on file  . Number of children: Not on file  . Years of education: Not on file  . Highest education level: Not on file  Occupational History  . Not on file  Tobacco Use  . Smoking status: Former Smoker    Packs/day: 0.25    Years: 50.00    Pack years: 12.50    Types: Cigarettes    Quit date: 07/2017    Years since quitting: 2.5  . Smokeless tobacco: Never Used  . Tobacco comment: quit smoking 07/2017  Substance and Sexual Activity  . Alcohol use: Yes    Alcohol/week: 14.0 - 21.0 standard drinks    Types: 14 - 21 Cans of beer per week  . Drug use: No  . Sexual activity: Not on file  Other Topics Concern  . Not on file  Social History Narrative  . Not on file   Social Determinants of Health   Financial Resource Strain:   . Difficulty of Paying Living Expenses:   Food Insecurity:   . Worried About Charity fundraiser in the Last Year:   . Arboriculturist in the Last Year:   Transportation Needs:   . Film/video editor (Medical):   [name] Lack of Transportation (Non-Medical):   Physical Activity:    . Days of Exercise per Week:   . Minutes of Exercise per Session:   Stress:   . Feeling of Stress :   Social Connections:   . Frequency of Communication with Friends and Family:   . Frequency of Social Gatherings with Friends and Family:   . Attends Religious Services:   . Active Member of Clubs or Organizations:   . Attends Archivist Meetings:   [name] Marital Status:   Intimate Partner Violence:   . Fear of Current or Ex-Partner:   . Emotionally Abused:   [name] Physically Abused:   . Sexually Abused:     FAMILY HISTORY: Family History  Problem Relation Age of Onset  . Arthritis Mother   . Cancer Father   . Cancer Sister   . Heart disease Maternal Uncle   . Colon cancer Neg Hx     ALLERGIES:  is allergic to neomycin-bacitracin zn-polymyx.  MEDICATIONS:  Current Outpatient Medications  Medication Sig Dispense Refill  . albuterol (VENTOLIN HFA) 108 (90 Base) MCG/ACT inhaler INHALE 1 TO 2 PUFFS INTO THE LUNGS EVERY 4 HOURS  AS NEEDED FOR WHEEZING OR SHORTNESS OF BREATH 6.7 g 2  . ANORO ELLIPTA 62.5-25 MCG/INH AEPB INHALE 1 PUFF INTO THE LUNGS DAILY 60 each 1  . aspirin 81 MG tablet Take 81 mg by mouth daily.    [name] atorvastatin (LIPITOR) 40 MG tablet Take 40 mg by mouth daily.     . benazepril (LOTENSIN) 20 MG tablet Take 20 mg by mouth daily.     . diphenhydrAMINE (BENADRYL) 12.5 MG/5ML elixir Take 12.5 mg by mouth daily as needed for allergies.     . folic acid (FOLVITE) 875 MCG tablet Take 400 mcg by mouth daily.    . furosemide (LASIX) 40 MG tablet Take 40 mg by mouth daily.     [name] HYDROcodone-acetaminophen (NORCO) 5-325 MG tablet 1/2 to 1 tablet 4 times daily as needed for pain. 60 tablet 0  . ibuprofen (ADVIL,MOTRIN) 200 MG tablet Take 400 mg by mouth every 6 (six) hours as needed for headache, mild pain or moderate pain.    [name] levothyroxine (SYNTHROID) 25 MCG tablet TAKE 1 TABLET BY MOUTH DAILY BEFORE BREAKFAST. 90 tablet 1  . metFORMIN (GLUCOPHAGE) 500 MG tablet Take  500 mg by mouth every morning.    . metoprolol succinate (TOPROL-XL) 25 MG 24 hr tablet TAKE 1 TABLET BY MOUTH DAILY. PLEASE MAKE OVERDUE APPT WITH DR. BEFORE ANYMORE REFILLS 15 tablet 0  . potassium chloride SA (K-DUR) 20 MEQ tablet Take 20 mEq by mouth daily.    . prochlorperazine (COMPAZINE) 10 MG tablet Take 10 mg by mouth every 6 (six) hours as needed for nausea or vomiting.     [name] UNABLE TO FIND Take by mouth.     No current facility-administered medications for this visit.    REVIEW OF SYSTEMS:   A 10+ POINT REVIEW OF SYSTEMS WAS OBTAINED including neurology, dermatology, psychiatry, cardiac, respiratory, lymph, extremities, GI, GU, Musculoskeletal, constitutional, breasts, reproductive, HEENT.  All pertinent positives are noted in the HPI.  All others are negative.   PHYSICAL EXAMINATION: Vitals:   01/11/20 1503  BP: 106/69  Pulse: (!) 102  Resp: 18  Temp: 98.5 F (36.9 C)  SpO2: 96%   Wt Readings from Last 3 Encounters:  01/11/20 244 lb 1.6 oz (110.7 kg)  11/30/19 243 lb (110.2 kg)  08/05/19 242 lb 8 oz (110 kg)   Body mass index is 35.02 kg/m.    Exam was given in a chair   GENERAL:alert, in no acute distress and comfortable SKIN: no acute rashes, no significant lesions EYES: conjunctiva are pink and non-injected, sclera anicteric OROPHARYNX: MMM, no exudates, no oropharyngeal erythema or ulceration NECK: supple, no JVD LYMPH:  no palpable lymphadenopathy in the cervical, axillary or inguinal regions LUNGS: normal respiratory effort, wheezing HEART: regular rate & rhythm ABDOMEN:  normoactive bowel sounds , non tender, not distended. No palpable hepatosplenomegaly.  Extremity: minimal pedal edema PSYCH: alert & oriented x 3 with fluent speech NEURO: no focal motor/sensory deficits  LABORATORY DATA:  I have reviewed the data as listed . CBC Latest Ref Rng & Units 11/30/2019 08/05/2019 03/13/2019  WBC 4.0 - 10.5 K/uL 10.9(H) 7.3 9.5  Hemoglobin 13.0 - 17.0 g/dL  13.0 13.0 12.0(L)  Hematocrit 39.0 - 52.0 % 41.5 40.1 38.4(L)  Platelets 150 - 400 K/uL 172 146(L) 178    . CMP Latest Ref Rng & Units 11/30/2019 08/05/2019 03/13/2019  Glucose 70 - 99 mg/dL 123(H) 153(H) 172(H)  BUN 8 - 23 mg/dL _0 Creatinine  0.61 - 1.24 mg/dL 1.10 1.11 1.20  Sodium 135 - 145 mmol/L 140 139 140  Potassium 3.5 - 5.1 mmol/L 4.3 4.2 4.4  Chloride 98 - 111 mmol/L 104 102 103  CO2 22 - 32 mmol/L _0 Calcium 8.9 - 10.3 mg/dL 9.4 9.7 9.8  Total Protein 6.5 - 8.1 g/dL 8.0 7.7 8.1  Total Bilirubin 0.3 - 1.2 mg/dL 0.5 0.3 0.3  Alkaline Phos 38 - 126 U/L 79 62 67  AST 15 - 41 U/L 12(L) 20 14(L)  ALT 0 - 44 U/L _1 08/10/2019  08/10/2019 Cytology- PAP  03/25/18 Endobronchial bx:     RADIOGRAPHIC STUDIES: I have personally reviewed the radiological images as listed and agreed with the findings in the report. CT Head Wo Contrast  Result Date: 12/31/2019 CLINICAL DATA:  Lung cancer with balance issues EXAM: CT HEAD WITHOUT CONTRAST TECHNIQUE: Contiguous axial images were obtained from the base of the skull through the vertex without intravenous contrast. COMPARISON:  2019 FINDINGS: Brain: There is no acute intracranial hemorrhage, mass effect, or edema. Gray-white differentiation is preserved. There is no extra-axial fluid collection. Prominence of the ventricles and sulci reflects stable parenchymal volume loss. Vascular: There is atherosclerotic calcification at the skull base. Skull: Calvarium is unremarkable. Sinuses/Orbits: Patchy ethmoid mucosal thickening. Orbits are unremarkable. Other: None. IMPRESSION: No acute intracranial hemorrhage, edema, or evidence of acute infarction. Suboptimal evaluation for intracranial metastatic disease. Electronically Signed   By: Macy Mis M.D.   On: 12/31/2019 10:37   CT Chest W Contrast  Result Date: 12/31/2019 CLINICAL DATA:  Restaging non-small cell lung cancer, metastatic. Evaluate for progression of disease.  EXAM: CT CHEST, ABDOMEN, AND PELVIS WITH CONTRAST TECHNIQUE: Multidetector CT imaging of the chest, abdomen and pelvis was performed following the standard protocol during bolus administration of intravenous contrast. CONTRAST:  146m OMNIPAQUE IOHEXOL 300 MG/ML  SOLN COMPARISON:  07/29/2019 FINDINGS: CT CHEST FINDINGS Cardiovascular: Heart size appears within normal limits. No pericardial effusion identified. Aortic atherosclerosis. Lad coronary artery atherosclerotic calcification. Mediastinum/Nodes: Normal appearance of the thyroid gland. The trachea appears patent and is midline. Normal appearance of the esophagus. No enlarged supraclavicular or axillary lymph nodes. No mediastinal or hilar adenopathy Lungs/Pleura: Loculated right pleural effusion is again noted. This is decreased in volume from previous exam. Paramediastinal fibrosis and mass-like architectural distortion is again identified. Treated tumor within the paramediastinal and perihilar right lung with extension into the mediastinum is again noted and appears unchanged. As on the previous exam the treated tumor wraps around and narrows the right mainstem bronchus and its branches. This is stable. Previously noted index nodule within the posterior right lower lobe is not confidently seen on today's study. Unchanged appearance of subpleural nodularity within the posterior left costophrenic sulcus, image 126/7. The largest nodule in this area measures 9 mm and is unchanged, image 126/7. No new findings within the lungs. Musculoskeletal: No chest wall mass or suspicious bone lesions identified. CT ABDOMEN PELVIS FINDINGS Hepatobiliary: The liver is cirrhotic with a nodular contour and hypertrophy of the caudate lobe and lateral segment of left lobe. No focal liver abnormalities noted. Gallbladder unremarkable. No bile duct dilatation. Pancreas: Unremarkable. No pancreatic ductal dilatation or surrounding inflammatory changes. Spleen: Normal in size  without focal abnormality. Adrenals/Urinary Tract: Normal appearance of the adrenal glands. Bilateral kidney lesions are identified. The largest arises from the lateral cortex of the right mid kidney measuring 3.3 cm and 4.5 Hounsfield units. This is compatible  with a benign Bosniak category 1 lesion. Additional kidney lesions measure less than 1 cm and are too small to reliably characterize. No hydronephrosis identified bilaterally. Left posterior bladder diverticula measures 2.5 cm. Stomach/Bowel: Stomach is within normal limits. Appendix appears normal. No evidence of bowel wall thickening, distention, or inflammatory changes. Vascular/Lymphatic: Aortic atherosclerosis. Infrarenal abdominal aorta measures 3.8 cm, image 84/2. Left periaortic lymph node is enlarged measuring 1.6 cm, image 80/2. Unchanged from PET-CT dated 06/23/2018, at which time this did not show any significant FDG uptake. Reproductive: Prostate is unremarkable. Other: There is an umbilical hernia which contains a nonobstructed loop of small bowela, image 104/2. Musculoskeletal: Degenerative disc disease noted within the lumbar spine. No aggressive lytic or sclerotic bone lesions. Unchanged compression fracture involving L1 and L3. IMPRESSION: 1. Stable appearance of treated tumor within the right perihilar and perihilar right lung. No specific findings identified to suggest local tumor recurrence. 2. Decrease in size of loculated right pleural effusion. 3. Cirrhosis. 4. Infrarenal abdominal aortic aneurysm. Recommend followup by ultrasound in 2 years. This recommendation follows ACR consensus guidelines: White Paper of the ACR Incidental Findings Committee II on Vascular Findings. J Am Coll Radiol 2013; 10:789-794. 5. Chronic L1 and L3 compression deformities. 6. Coronary artery calcifications Aortic Atherosclerosis (ICD10-I70.0). Electronically Signed   By: Kerby Moors M.D.   On: 12/31/2019 10:39   CT Abdomen Pelvis W Contrast  Result  Date: 12/31/2019 CLINICAL DATA:  Restaging non-small cell lung cancer, metastatic. Evaluate for progression of disease. EXAM: CT CHEST, ABDOMEN, AND PELVIS WITH CONTRAST TECHNIQUE: Multidetector CT imaging of the chest, abdomen and pelvis was performed following the standard protocol during bolus administration of intravenous contrast. CONTRAST:  154m OMNIPAQUE IOHEXOL 300 MG/ML  SOLN COMPARISON:  07/29/2019 FINDINGS: CT CHEST FINDINGS Cardiovascular: Heart size appears within normal limits. No pericardial effusion identified. Aortic atherosclerosis. Lad coronary artery atherosclerotic calcification. Mediastinum/Nodes: Normal appearance of the thyroid gland. The trachea appears patent and is midline. Normal appearance of the esophagus. No enlarged supraclavicular or axillary lymph nodes. No mediastinal or hilar adenopathy Lungs/Pleura: Loculated right pleural effusion is again noted. This is decreased in volume from previous exam. Paramediastinal fibrosis and mass-like architectural distortion is again identified. Treated tumor within the paramediastinal and perihilar right lung with extension into the mediastinum is again noted and appears unchanged. As on the previous exam the treated tumor wraps around and narrows the right mainstem bronchus and its branches. This is stable. Previously noted index nodule within the posterior right lower lobe is not confidently seen on today's study. Unchanged appearance of subpleural nodularity within the posterior left costophrenic sulcus, image 126/7. The largest nodule in this area measures 9 mm and is unchanged, image 126/7. No new findings within the lungs. Musculoskeletal: No chest wall mass or suspicious bone lesions identified. CT ABDOMEN PELVIS FINDINGS Hepatobiliary: The liver is cirrhotic with a nodular contour and hypertrophy of the caudate lobe and lateral segment of left lobe. No focal liver abnormalities noted. Gallbladder unremarkable. No bile duct dilatation.  Pancreas: Unremarkable. No pancreatic ductal dilatation or surrounding inflammatory changes. Spleen: Normal in size without focal abnormality. Adrenals/Urinary Tract: Normal appearance of the adrenal glands. Bilateral kidney lesions are identified. The largest arises from the lateral cortex of the right mid kidney measuring 3.3 cm and 4.5 Hounsfield units. This is compatible with a benign Bosniak category 1 lesion. Additional kidney lesions measure less than 1 cm and are too small to reliably characterize. No hydronephrosis identified bilaterally. Left posterior bladder diverticula measures  2.5 cm. Stomach/Bowel: Stomach is within normal limits. Appendix appears normal. No evidence of bowel wall thickening, distention, or inflammatory changes. Vascular/Lymphatic: Aortic atherosclerosis. Infrarenal abdominal aorta measures 3.8 cm, image 84/2. Left periaortic lymph node is enlarged measuring 1.6 cm, image 80/2. Unchanged from PET-CT dated 06/23/2018, at which time this did not show any significant FDG uptake. Reproductive: Prostate is unremarkable. Other: There is an umbilical hernia which contains a nonobstructed loop of small bowela, image 104/2. Musculoskeletal: Degenerative disc disease noted within the lumbar spine. No aggressive lytic or sclerotic bone lesions. Unchanged compression fracture involving L1 and L3. IMPRESSION: 1. Stable appearance of treated tumor within the right perihilar and perihilar right lung. No specific findings identified to suggest local tumor recurrence. 2. Decrease in size of loculated right pleural effusion. 3. Cirrhosis. 4. Infrarenal abdominal aortic aneurysm. Recommend followup by ultrasound in 2 years. This recommendation follows ACR consensus guidelines: White Paper of the ACR Incidental Findings Committee II on Vascular Findings. J Am Coll Radiol 2013; 10:789-794. 5. Chronic L1 and L3 compression deformities. 6. Coronary artery calcifications Aortic Atherosclerosis (ICD10-I70.0).  Electronically Signed   By: Kerby Moors M.D.   On: 12/31/2019 10:39    ASSESSMENT & PLAN:   75 y.o.  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 Post obstructive collapse of the Right upper lobe. No hypermetabolic mediastinal or supraclavicular lymph nodes. No evidence of distant mets.  06/23/18 PET/CT revealed  Marked reduction in activity associated with the dominant right upper lobe central mass, maximum SUV 3.4 and previously 15.3. Please note that this lesion does still in case and markedly narrows the right upper lobe bronchus. 2. There is some patchy ground-glass opacities with small airspace opacity components in the right upper lobe, right middle lobe, right lower lobe, and lingula. Some of these are hypermetabolic, for example lingular opacity has a maximum SUV of 6.2 and the right lower lobe bandlike opacity 5.7. These are probably inflammatory and merit surveillance. 3. Small to moderate right pleural effusion with accentuated metabolic activity. Malignant effusion not excluded. 4. No findings of metastatic disease to the neck, abdomen/pelvis, or skeleton. 5. Aortic aneurysm NOS. Infrarenal aortic aneurysm 3.8 cm in diameter. Recommend followup by [place] in 2 years. 6. Other imaging findings of potential clinical significance: Chronic paranasal sinusitis. Aortic Atherosclerosis. Coronary atherosclerosis. Hepatic cirrhosis. Ventral hernia containing a loop of small bowel. Suspected late phase healing of the right anterior eighth rib.   09/18/18 CT Chest revealed Rarely relatively stable perihilar consolidation on the RIGHT consistent post  treatment change. Persistent narrowing of the proximal bronchi. 2. Interval increase in interstitial thickening in the RIGHT upper lobe. Differential include postobstructive process versus lymphangitic carcinoma. Favor postobstructive process. 3. Interval increase in RIGHT pleural effusion. 4. With potential progressive malignant findings versus progressive benign change, consider follow-up FDG PET scan.   10/02/18 Cytology from thoracentesis showed no malignant cells. 10/02/18 CXR revealed No evidence of pneumothorax after RIGHT thoracentesis. 2. Persistent large, loculated RIGHT pleural effusion and associated dense passive atelectasis in the RIGHT LOWER LOBE. 3. No new abnormalities.  10/29/18 CT Chest revealed Interval decrease in right pleural effusion with residual loculated component seen anteriorly and medially. 2. The interstitial and airspace disease  in the posterior right upper lung is similar to prior. 3. Stable volume loss right hemithorax with right parahilar interstitial and airspace disease likely reflecting radiation fibrosis. 4. Nodular liver morphology suggests cirrhosis.  S/p 5 cycles of Durvalumab  03/06/19 CT Chest revealed "Stable appearing extensive radiation changes and treated tumor in the right upper lobe and right paramediastinal lung and right hilum. I do not see any definite CT findings for progressive disease. 2. Stable loculated right pleural fluid collections. 3. No CT findings to suggest metastatic pulmonary nodules in the left lung or metastatic disease involving the upper abdomen. 4. Stable cirrhotic changes involving the liver. Aortic Atherosclerosis and Emphysema."  11/24/18 ECHO revealed LV EF of 50-55%  2) RUL airway obstruction with RUL lung collapse.  3) Concern for severe narrowing of the right main pulmonary artery as well as right upper lobe and right middle lobe arteries.   4.  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 Stable appearance of treated tumor within the right perihilar and perihilar right lung. No specific findings identified to suggest local tumor recurrence. 2. Decrease in size of loculated right pleural effusion. 3. Cirrhosis. 4. Infrarenal abdominal aortic aneurysm." -Discussed 12/31/2019 Head CT ([id number]) which revealed "No acute intracranial hemorrhage, edema, or evidence of acute infarction. Suboptimal evaluation for intracranial metastatic disease." -The pt shows no lab or clinical evidence of his Squmaous Cell Lung Cancer progression/recurrence at this time. -Recommended that the pt continue to eat well, drink at least 48-64 oz of water each day, and walk 20-30 minutes each day.  -Advised pt that his leg weakness is likely due to muscle atrophy from a sedentary lifestyle.  -Recommend pt begin 2000 IU Vitamin D daily for bone strength -Will see back in 4 months with labs   FOLLOW UP: RTC with Dr Irene Limbo with labs in 4 months   The total time spent in the appt was 30 minutes and more than 50% was on counseling and direct patient cares.  All of the patient's questions were answered with apparent satisfaction. The patient knows to call the clinic with any problems, questions or concerns.   Sullivan Lone MD [place] AAHIVMS [facility] Hematology/Oncology Physician [facility]  (Office):       [phone] (Work cell):  [phone] (Fax):           [phone]   I, Yevette Edwards, am acting as a scribe for Dr. Sullivan Lone.   .I have reviewed the above documentation for accuracy and completeness, and I agree with the above. Brunetta Genera MD

## 2020-01-12 ENCOUNTER — Telehealth: Payer: Self-pay | Admitting: Hematology

## 2020-01-12 NOTE — Telephone Encounter (Signed)
Scheduled per 04/12 los, patient has been called and voicemail was left. 

## 2020-01-15 ENCOUNTER — Other Ambulatory Visit: Payer: Self-pay | Admitting: Physician Assistant

## 2020-02-03 ENCOUNTER — Other Ambulatory Visit: Payer: Self-pay | Admitting: *Deleted

## 2020-02-03 DIAGNOSIS — C3490 Malignant neoplasm of unspecified part of unspecified bronchus or lung: Secondary | ICD-10-CM

## 2020-02-03 MED ORDER — HYDROCODONE-ACETAMINOPHEN 5-325 MG PO TABS: ORAL_TABLET | ORAL | 0 refills | Status: DC

## 2020-02-03 NOTE — Telephone Encounter (Signed)
Spouse called to request refill of patient's pain med

## 2020-02-04 ENCOUNTER — Other Ambulatory Visit: Payer: Self-pay | Admitting: Physician Assistant

## 2020-02-23 ENCOUNTER — Ambulatory Visit (INDEPENDENT_AMBULATORY_CARE_PROVIDER_SITE_OTHER): Payer: Medicare HMO | Admitting: Podiatry

## 2020-02-23 ENCOUNTER — Encounter: Payer: Self-pay | Admitting: Podiatry

## 2020-02-23 ENCOUNTER — Other Ambulatory Visit: Payer: Self-pay

## 2020-02-23 VITALS — Temp 97.3°F

## 2020-02-23 DIAGNOSIS — E119 Type 2 diabetes mellitus without complications: Secondary | ICD-10-CM | POA: Diagnosis not present

## 2020-02-23 DIAGNOSIS — M79676 Pain in unspecified toe(s): Secondary | ICD-10-CM | POA: Diagnosis not present

## 2020-02-23 DIAGNOSIS — B351 Tinea unguium: Secondary | ICD-10-CM

## 2020-02-23 DIAGNOSIS — M19079 Primary osteoarthritis, unspecified ankle and foot: Secondary | ICD-10-CM

## 2020-02-23 NOTE — Progress Notes (Signed)
Patient ID: Barry Booker, male   DOB: 1946-01-03, 74 y.o.   MRN: [id number] Complaint:  Visit Type: Patient returns to my office for continued preventative foot care services. Complaint: Patient states" my nails have grown long and thick and become painful to walk and wear shoes" . The patient presents for preventative foot care services. No changes to ROS.  Patient presents to the office with his wife.  Podiatric Exam: Vascular: dorsalis pedis and posterior tibial pulses are palpable bilateral. Capillary return is immediate. Temperature gradient is WNL. Skin turgor WNL  Sensorium: Normal Semmes Weinstein monofilament test. Normal tactile sensation bilaterally. Nail Exam: Pt has thick disfigured discolored nails with subungual debris noted bilateral entire nail hallux through fifth toenails Ulcer Exam: There is no evidence of ulcer or pre-ulcerative changes or infection. Orthopedic Exam: Muscle tone and strength are WNL. No limitations in general ROM. No crepitus or effusions noted. Foot type and digits show no abnormalities. Bony prominences are unremarkable. Skin: No Porokeratosis. No infection or ulcers  Diagnosis:  Onychomycosis, , Pain in right toe, pain in left toes  Treatment & Plan Procedures and Treatment: Consent by patient was obtained for treatment procedures. The patient understood the discussion of treatment and procedures well. All questions were answered thoroughly reviewed. Debridement of mycotic and hypertrophic toenails, 1 through 5 bilateral and clearing of subungual debris. No ulceration, no infection noted.  Return Visit-Office Procedure: Patient instructed to return to the office for a follow up visit 3 months for continued evaluation and treatment.  Gardiner Barefoot DPM

## 2020-03-04 ENCOUNTER — Telehealth: Payer: Self-pay

## 2020-03-04 NOTE — Telephone Encounter (Signed)
Patient requesting refill for HYDROcodone-acetaminophen (NORCO) 5-325 MG tablet

## 2020-03-07 ENCOUNTER — Other Ambulatory Visit: Payer: Self-pay | Admitting: Hematology

## 2020-03-07 DIAGNOSIS — C3490 Malignant neoplasm of unspecified part of unspecified bronchus or lung: Secondary | ICD-10-CM

## 2020-03-07 MED ORDER — HYDROCODONE-ACETAMINOPHEN 5-325 MG PO TABS: ORAL_TABLET | ORAL | 0 refills | Status: DC

## 2020-03-20 ENCOUNTER — Other Ambulatory Visit: Payer: Self-pay | Admitting: Hematology

## 2020-04-07 ENCOUNTER — Other Ambulatory Visit: Payer: Self-pay | Admitting: *Deleted

## 2020-04-07 DIAGNOSIS — C3490 Malignant neoplasm of unspecified part of unspecified bronchus or lung: Secondary | ICD-10-CM

## 2020-04-07 MED ORDER — HYDROCODONE-ACETAMINOPHEN 5-325 MG PO TABS: ORAL_TABLET | ORAL | 0 refills | Status: DC

## 2020-04-07 NOTE — Telephone Encounter (Signed)
  Wife called - requested refill of Vicodin. Request routed to Dr. Irene Limbo

## 2020-04-09 ENCOUNTER — Other Ambulatory Visit: Payer: Self-pay | Admitting: Physician Assistant

## 2020-04-20 ENCOUNTER — Telehealth: Payer: Self-pay | Admitting: Hematology

## 2020-04-20 NOTE — Telephone Encounter (Signed)
Rescheduled 08/13 appointment to 08/19 due to provider pal, patient has been called and voicemail was left.

## 2020-04-21 ENCOUNTER — Telehealth: Payer: Self-pay | Admitting: Hematology

## 2020-04-21 NOTE — Telephone Encounter (Signed)
Rescheduled per 08/13 to 08/19 due to provider pal, patient has been called and voicemail was left.

## 2020-05-06 ENCOUNTER — Other Ambulatory Visit: Payer: Self-pay | Admitting: *Deleted

## 2020-05-06 DIAGNOSIS — C3490 Malignant neoplasm of unspecified part of unspecified bronchus or lung: Secondary | ICD-10-CM

## 2020-05-06 NOTE — Telephone Encounter (Signed)
Wife requested refill of patient's pain med - request routed to Dr. Irene Limbo

## 2020-05-09 MED ORDER — HYDROCODONE-ACETAMINOPHEN 5-325 MG PO TABS: ORAL_TABLET | ORAL | 0 refills | Status: DC

## 2020-05-13 ENCOUNTER — Other Ambulatory Visit: Payer: Medicare HMO

## 2020-05-13 ENCOUNTER — Ambulatory Visit: Payer: Medicare HMO | Admitting: Hematology

## 2020-05-18 NOTE — Progress Notes (Signed)
HEMATOLOGY/ONCOLOGY CLINIC NOTE  Date of Service: 05/19/2020  Patient Care Team: Bernerd Limbo, MD as PCP - General (Family Medicine) Sherren Mocha, MD as PCP - Cardiology (Cardiology)  CHIEF COMPLAINTS/PURPOSE OF CONSULTATION:  F/u for mx of lung cancer  HISTORY OF PRESENTING ILLNESS:   Barry Booker is a wonderful 74 y.o. male who has been referred to [place] by Dr Marshell Garfinkel for evaluation and management of presumed newly diagnosed rt sided lung cancer with concern for bronchial obstruction.  Patient has a history of hypertension, dyslipidemia, diabetes, COPD, alcohol abuse and previously had a chest x-ray on 02/20/2018 at [facility] which showed Right upper lobe consolidation/atelectasis may relate to post obstructive pneumonitis/pneumonia. An underlying obstructing lesion should be considered. Consider CT for further assessment.  H subsequently then had a CT of the chest on 02/27/2018 which showed -5.8 x 4.9 cm mass in the right mediastinum and hilum with associated severe narrowing of the right main pulmonary artery as well as right upper lobe and right middle lobe arteries. Obstructed right main stem bronchus and collapse of the right upper lobe and right middle lobe. Small right effusion. The appearance is similar to the patient's chest radiograph from 02/20/2018.  Patient subsequently had a PET CT on 03/10/2018 by Dr. Vaughan Browner which showed Large intensely hypermetabolic RIGHT hilar mass obstructs the RIGHT upper lobe bronchus and surrounds the bronchus intermedius. Post obstructive collapse of the RIGHT upper lobe.  No hypermetabolic mediastinal or supraclavicular lymph nodes. No evidence distant metastatic disease.  Patient had a flexible video fiberoptic bronchoscopy done on 03/21/2018 by Dr. Vaughan Browner which showed which showed some nodularity of the right mainstem bronchial mucosa with near complete occlusion of right main stem. Unable to traverse the occluded part of right main stem. The  mucosa was friable with propensity to bleed. Endobronchial biopsies and brushings were taken from the R mainstem and right upper lobe for pathology.  Preliminary results showed atypical cells however final cytology/pathology is currently pending.  Patient report rt sided chest pain with deep inspiration, anorexia and weight loss of about 30lbs in the last 6 months.  Interval History:   Barry Booker returns to clinic today for management and evaluation of his lung cancer. We are joined today by his wife, Patsy. The patient's last visit with [place] was on 01/11/2020. The pt reports that he is doing well overall.  The pt reports that he lost his balance and fell in the kitchen a few months ago. He has been using his walker more around the house and is primarily using his cane for the stairs. Pt has an intermittent pain in his right arm/shoulder. He continues smoking about a pack per week and his breathing is relatively steady.   Lab results today (05/19/20) of CBC w/diff and CMP is as follows: all values are WNL except for Hgb at 12.9, Glucose at 143, BUN at 28, Creatinine at 1.28, AST at 11, GFR Est Non Af Am at 56.  On review of systems, pt reports right arm/shoulder discomfort and denies leg swelling, SOB, unexpected weight loss and any other symptoms.    MEDICAL HISTORY:  Past Medical History:  Diagnosis Date  . Alcohol abuse   . COPD (chronic obstructive pulmonary disease) ([place])   . Diabetes mellitus without complication ([place])   . Echocardiogram    Echo 11/2018: EF 45-50, mild MAC, mild calc of AoV (difficult study) >> Limited echo with Definity contrast 11/2018: EF 50-55  . Gout   .  High cholesterol   . Hypertension   . Malignant neoplasm of right upper lobe of lung ([place]) 03/29/2018    SURGICAL HISTORY: Past Surgical History:  Procedure Laterality Date  . COLONOSCOPY    . FLEXIBLE BRONCHOSCOPY [name] 03/21/2018   Procedure: FLEXIBLE BRONCHOSCOPY;  Surgeon: Marshell Garfinkel, MD;   Location: [place];  Service: Pulmonary;  Laterality: [name];  . NOSE SURGERY  90's  . VIDEO BRONCHOSCOPY WITH ENDOBRONCHIAL ULTRASOUND [name] 03/21/2018   Procedure: VIDEO BRONCHOSCOPY WITH ENDOBRONCHIAL ULTRASOUND;  Surgeon: Marshell Garfinkel, MD;  Location: [place];  Service: Pulmonary;  Laterality: [name];    SOCIAL HISTORY: Social History   Socioeconomic History  . Marital status: Married    Spouse name: Not on file  . Number of children: Not on file  . Years of education: Not on file  . Highest education level: Not on file  Occupational History  . Not on file  Tobacco Use  . Smoking status: Former Smoker    Packs/day: 0.25    Years: 50.00    Pack years: 12.50    Types: Cigarettes    Quit date: 07/2017    Years since quitting: 2.8  . Smokeless tobacco: Never Used  . Tobacco comment: quit smoking 07/2017  Vaping Use  . Vaping Use: Never used  Substance and Sexual Activity  . Alcohol use: Yes    Alcohol/week: 14.0 - 21.0 standard drinks    Types: 14 - 21 Cans of beer per week  . Drug use: No  . Sexual activity: Not on file  Other Topics Concern  . Not on file  Social History Narrative  . Not on file   Social Determinants of Health   Financial Resource Strain:   . Difficulty of Paying Living Expenses: Not on file  Food Insecurity:   . Worried About Charity fundraiser in the Last Year: Not on file  . Ran Out of Food in the Last Year: Not on file  Transportation Needs:   . Lack of Transportation (Medical): Not on file  . Lack of Transportation (Non-Medical): Not on file  Physical Activity:   . Days of Exercise per Week: Not on file  . Minutes of Exercise per Session: Not on file  Stress:   . Feeling of Stress : Not on file  Social Connections:   . Frequency of Communication with Friends and Family: Not on file  . Frequency of Social Gatherings with Friends and Family: Not on file  . Attends Religious Services: Not on file  . Active Member of Clubs or Organizations: Not on  file  . Attends Archivist Meetings: Not on file  . Marital Status: Not on file  Intimate Partner Violence:   . Fear of Current or Ex-Partner: Not on file  . Emotionally Abused: Not on file  . Physically Abused: Not on file  . Sexually Abused: Not on file    FAMILY HISTORY: Family History  Problem Relation Age of Onset  . Arthritis Mother   . Cancer Father   . Cancer Sister   . Heart disease Maternal Uncle   . Colon cancer Neg Hx     ALLERGIES:  is allergic to neomycin-bacitracin zn-polymyx.  MEDICATIONS:  Current Outpatient Medications  Medication Sig Dispense Refill  . albuterol (VENTOLIN HFA) 108 (90 Base) MCG/ACT inhaler INHALE 1 TO 2 PUFFS INTO THE LUNGS EVERY 4 HOURS AS NEEDED FOR WHEEZING OR SHORTNESS OF BREATH 8 g 2  . ANORO ELLIPTA 62.5-25 MCG/INH AEPB INHALE  1 PUFF INTO THE LUNGS DAILY 60 each 1  . aspirin 81 MG tablet Take 81 mg by mouth daily.    [name] atorvastatin (LIPITOR) 40 MG tablet Take 40 mg by mouth daily.     . benazepril (LOTENSIN) 20 MG tablet Take 20 mg by mouth daily.     . diphenhydrAMINE (BENADRYL) 12.5 MG/5ML elixir Take 12.5 mg by mouth daily as needed for allergies.     . folic acid (FOLVITE) 878 MCG tablet Take 400 mcg by mouth daily.    . furosemide (LASIX) 40 MG tablet Take 40 mg by mouth daily.     [name] HYDROcodone-acetaminophen (NORCO) 5-325 MG tablet 1/2 to 1 tablet 4 times daily as needed for pain. 60 tablet 0  . ibuprofen (ADVIL,MOTRIN) 200 MG tablet Take 400 mg by mouth every 6 (six) hours as needed for headache, mild pain or moderate pain.    [name] levothyroxine (SYNTHROID) 25 MCG tablet TAKE 1 TABLET BY MOUTH DAILY BEFORE BREAKFAST. (Patient taking differently: 75 mcg. ) 90 tablet 1  . metFORMIN (GLUCOPHAGE) 500 MG tablet Take 500 mg by mouth every morning.    . metoprolol succinate (TOPROL-XL) 25 MG 24 hr tablet TAKE 1 TABLET BY MOUTH DAILY. PLEASE MAKE OVERDUE APPT WITH DR. BEFORE ANYMORE REFILLS 15 tablet 0  . potassium chloride SA  (K-DUR) 20 MEQ tablet Take 20 mEq by mouth daily.    . prochlorperazine (COMPAZINE) 10 MG tablet Take 10 mg by mouth every 6 (six) hours as needed for nausea or vomiting.     [name] UNABLE TO FIND Take by mouth.     No current facility-administered medications for this visit.    REVIEW OF SYSTEMS:   A 10+ POINT REVIEW OF SYSTEMS WAS OBTAINED including neurology, dermatology, psychiatry, cardiac, respiratory, lymph, extremities, GI, GU, Musculoskeletal, constitutional, breasts, reproductive, HEENT.  All pertinent positives are noted in the HPI.  All others are negative.   PHYSICAL EXAMINATION: Vitals:   05/19/20 1022  BP: 129/78  Pulse: 93  Resp: 18  Temp: (!) 97.4 F (36.3 C)  SpO2: 99%   Wt Readings from Last 3 Encounters:  05/19/20 241 lb 1.6 oz (109.4 kg)  01/11/20 244 lb 1.6 oz (110.7 kg)  11/30/19 243 lb (110.2 kg)   Body mass index is 34.59 kg/m.    Exam was given in a chair   GENERAL:alert, in no acute distress and comfortable SKIN: no acute rashes, no significant lesions EYES: conjunctiva are pink and non-injected, sclera anicteric OROPHARYNX: MMM, no exudates, no oropharyngeal erythema or ulceration NECK: supple, no JVD LYMPH:  no palpable lymphadenopathy in the cervical, axillary or inguinal regions LUNGS: clear to auscultation b/l with normal respiratory effort HEART: regular rate & rhythm ABDOMEN:  normoactive bowel sounds , non tender, not distended. No palpable hepatosplenomegaly.  Extremity: no pedal edema PSYCH: alert & oriented x 3 with fluent speech NEURO: no focal motor/sensory deficits  LABORATORY DATA:  I have reviewed the data as listed . CBC Latest Ref Rng & Units 05/19/2020 11/30/2019 08/05/2019  WBC 4.0 - 10.5 K/uL 8.9 10.9(H) 7.3  Hemoglobin 13.0 - 17.0 g/dL 12.9(L) 13.0 13.0  Hematocrit 39 - 52 % 41.0 41.5 40.1  Platelets 150 - 400 K/uL 153 172 146(L)    . CMP Latest Ref Rng & Units 05/19/2020 11/30/2019 08/05/2019  Glucose 70 - 99 mg/dL 143(H)  123(H) 153(H)  BUN 8 - 23 mg/dL 28(H) 17 19  Creatinine 0.61 - 1.24 mg/dL 1.28(H) 1.10 1.11  Sodium  135 - 145 mmol/L 141 140 139  Potassium 3.5 - 5.1 mmol/L 4.3 4.3 4.2  Chloride 98 - 111 mmol/L 104 104 102  CO2 22 - 32 mmol/L _0 Calcium 8.9 - 10.3 mg/dL 9.9 9.4 9.7  Total Protein 6.5 - 8.1 g/dL 7.8 8.0 7.7  Total Bilirubin 0.3 - 1.2 mg/dL 0.3 0.5 0.3  Alkaline Phos 38 - 126 U/L 66 79 62  AST 15 - 41 U/L 11(L) 12(L) 20  ALT 0 - 44 U/L _1 08/10/2019  08/10/2019 Cytology- PAP  03/25/18 Endobronchial bx:     RADIOGRAPHIC STUDIES: I have personally reviewed the radiological images as listed and agreed with the findings in the report. No results found.  ASSESSMENT & PLAN:   74 y.o.  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 Post obstructive collapse of the Right upper lobe. No hypermetabolic mediastinal or supraclavicular lymph nodes. No evidence of distant mets.  06/23/18 PET/CT revealed  Marked reduction in activity associated with the dominant right upper lobe central mass, maximum SUV 3.4 and previously 15.3. Please note that this lesion does still in case and markedly narrows the right upper lobe bronchus. 2. There is some patchy ground-glass opacities with small airspace opacity components in the right upper lobe, right middle lobe, right lower lobe, and lingula. Some of these are hypermetabolic, for example lingular opacity has a maximum SUV of 6.2 and the right lower lobe bandlike opacity 5.7. These are probably inflammatory and merit surveillance. 3. Small to moderate right pleural effusion with accentuated metabolic activity.  Malignant effusion not excluded. 4. No findings of metastatic disease to the neck, abdomen/pelvis, or skeleton. 5. Aortic aneurysm NOS. Infrarenal aortic aneurysm 3.8 cm in diameter. Recommend followup by [place] in 2 years. 6. Other imaging findings of potential clinical significance: Chronic paranasal sinusitis. Aortic Atherosclerosis. Coronary atherosclerosis. Hepatic cirrhosis. Ventral hernia containing a loop of small bowel. Suspected late phase healing of the right anterior eighth rib.   09/18/18 CT Chest revealed Rarely relatively stable perihilar consolidation on the RIGHT consistent post treatment change. Persistent narrowing of the proximal bronchi. 2. Interval increase in interstitial thickening in the RIGHT upper lobe. Differential include postobstructive process versus lymphangitic carcinoma. Favor postobstructive process. 3. Interval increase in RIGHT pleural effusion. 4. With potential progressive malignant findings versus progressive benign change, consider follow-up FDG PET scan.   10/02/18 Cytology from thoracentesis showed no malignant cells. 10/02/18 CXR revealed No evidence of pneumothorax after RIGHT thoracentesis. 2. Persistent large, loculated RIGHT pleural effusion and associated dense passive atelectasis in the RIGHT LOWER LOBE. 3. No new abnormalities.  10/29/18 CT Chest revealed Interval decrease in right pleural effusion with residual loculated component seen anteriorly and medially. 2. The interstitial and airspace disease in the posterior right upper lung is similar to prior. 3. Stable volume loss right hemithorax with right parahilar interstitial and airspace disease likely reflecting radiation fibrosis. 4. Nodular liver morphology suggests cirrhosis.  S/p 5 cycles of Durvalumab  03/06/19 CT Chest revealed "Stable appearing extensive radiation changes and treated tumor in the right upper lobe and right paramediastinal lung and right hilum. I do not see any definite CT findings for  progressive disease. 2.  Stable loculated right pleural fluid collections. 3. No CT findings to suggest metastatic pulmonary nodules in the left lung or metastatic disease involving the upper abdomen. 4. Stable cirrhotic changes involving the liver. Aortic Atherosclerosis and Emphysema."  11/24/18 ECHO revealed LV EF of 50-55%  12/31/19 CT C/A/P  ([id number]) ([id number]) revealed "1. Stable appearance of treated tumor within the right perihilar and perihilar right lung. No specific findings identified to suggest local tumor recurrence. 2. Decrease in size of loculated right pleural effusion. 3. Cirrhosis. 4. Infrarenal abdominal aortic aneurysm."  2) RUL airway obstruction with RUL lung collapse.  3) Concern for severe narrowing of the right main pulmonary artery as well as right upper lobe and right middle lobe arteries.   4.  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 Will continue watchful observation.  -Recommended that the pt continue to eat well, drink at least 48-64 oz of water each day, and moisturize his skin properly.  -Continue 2000 IU Vitamin D daily  -again counseled on smoking cessation. -Will repeat CT Chest in 3 months -Will see back in 14 weeks with labs    FOLLOW UP: CT chest in 12 weeks RTC with Dr Irene Limbo with labs in 14 weeks   The total time spent in the appt was 20 minutes and more than 50% was on counseling and direct patient cares.  All of the patient's questions were answered with apparent satisfaction. The patient knows to call the clinic with any problems, questions or concerns.   Sullivan Lone MD [place] AAHIVMS [facility]  [facility] Hematology/Oncology Physician [facility]  (Office):       [phone] (Work cell):  [phone] (Fax):           [phone]   I, Yevette Edwards, am acting as a scribe for Dr. Sullivan Lone.   .I have reviewed the above documentation for accuracy and completeness, and I agree with the above. Brunetta Genera MD

## 2020-05-19 ENCOUNTER — Inpatient Hospital Stay (HOSPITAL_BASED_OUTPATIENT_CLINIC_OR_DEPARTMENT_OTHER): Payer: Medicare HMO | Admitting: Hematology

## 2020-05-19 ENCOUNTER — Inpatient Hospital Stay: Payer: Medicare HMO | Attending: Hematology

## 2020-05-19 ENCOUNTER — Other Ambulatory Visit: Payer: Self-pay

## 2020-05-19 VITALS — BP 129/78 | HR 93 | Temp 97.4°F | Resp 18 | Ht 70.0 in | Wt 241.1 lb

## 2020-05-19 DIAGNOSIS — E785 Hyperlipidemia, unspecified: Secondary | ICD-10-CM | POA: Diagnosis not present

## 2020-05-19 DIAGNOSIS — I2510 Atherosclerotic heart disease of native coronary artery without angina pectoris: Secondary | ICD-10-CM | POA: Diagnosis not present

## 2020-05-19 DIAGNOSIS — C3490 Malignant neoplasm of unspecified part of unspecified bronchus or lung: Secondary | ICD-10-CM | POA: Diagnosis not present

## 2020-05-19 DIAGNOSIS — J90 Pleural effusion, not elsewhere classified: Secondary | ICD-10-CM | POA: Diagnosis not present

## 2020-05-19 DIAGNOSIS — E7800 Pure hypercholesterolemia, unspecified: Secondary | ICD-10-CM | POA: Diagnosis not present

## 2020-05-19 DIAGNOSIS — Z85048 Personal history of other malignant neoplasm of rectum, rectosigmoid junction, and anus: Secondary | ICD-10-CM | POA: Diagnosis not present

## 2020-05-19 DIAGNOSIS — K7460 Unspecified cirrhosis of liver: Secondary | ICD-10-CM | POA: Insufficient documentation

## 2020-05-19 DIAGNOSIS — Z87891 Personal history of nicotine dependence: Secondary | ICD-10-CM | POA: Insufficient documentation

## 2020-05-19 DIAGNOSIS — I10 Essential (primary) hypertension: Secondary | ICD-10-CM | POA: Diagnosis not present

## 2020-05-19 DIAGNOSIS — Z809 Family history of malignant neoplasm, unspecified: Secondary | ICD-10-CM | POA: Diagnosis not present

## 2020-05-19 DIAGNOSIS — R1010 Upper abdominal pain, unspecified: Secondary | ICD-10-CM | POA: Diagnosis not present

## 2020-05-19 DIAGNOSIS — E119 Type 2 diabetes mellitus without complications: Secondary | ICD-10-CM | POA: Insufficient documentation

## 2020-05-19 DIAGNOSIS — J449 Chronic obstructive pulmonary disease, unspecified: Secondary | ICD-10-CM | POA: Diagnosis not present

## 2020-05-19 DIAGNOSIS — M109 Gout, unspecified: Secondary | ICD-10-CM | POA: Diagnosis not present

## 2020-05-19 DIAGNOSIS — C3491 Malignant neoplasm of unspecified part of right bronchus or lung: Secondary | ICD-10-CM

## 2020-05-19 LAB — CBC WITH DIFFERENTIAL/PLATELET
Abs Immature Granulocytes: 0.05 10*3/uL (ref 0.00–0.07)
Basophils Absolute: 0 10*3/uL (ref 0.0–0.1)
Basophils Relative: 0 %
Eosinophils Absolute: 0.2 10*3/uL (ref 0.0–0.5)
Eosinophils Relative: 2 %
HCT: 41 % (ref 39.0–52.0)
Hemoglobin: 12.9 g/dL — ABNORMAL LOW (ref 13.0–17.0)
Immature Granulocytes: 1 %
Lymphocytes Relative: 11 %
Lymphs Abs: 1 10*3/uL (ref 0.7–4.0)
MCH: 28.4 pg (ref 26.0–34.0)
MCHC: 31.5 g/dL (ref 30.0–36.0)
MCV: 90.3 fL (ref 80.0–100.0)
Monocytes Absolute: 0.8 10*3/uL (ref 0.1–1.0)
Monocytes Relative: 9 %
Neutro Abs: 7 10*3/uL (ref 1.7–7.7)
Neutrophils Relative %: 77 %
Platelets: 153 10*3/uL (ref 150–400)
RBC: 4.54 MIL/uL (ref 4.22–5.81)
RDW: 14.8 % (ref 11.5–15.5)
WBC: 8.9 10*3/uL (ref 4.0–10.5)
nRBC: 0 % (ref 0.0–0.2)

## 2020-05-19 LAB — CMP (CANCER CENTER ONLY)
ALT: 8 U/L (ref 0–44)
AST: 11 U/L — ABNORMAL LOW (ref 15–41)
Albumin: 3.5 g/dL (ref 3.5–5.0)
Alkaline Phosphatase: 66 U/L (ref 38–126)
Anion gap: 8 (ref 5–15)
BUN: 28 mg/dL — ABNORMAL HIGH (ref 8–23)
CO2: 29 mmol/L (ref 22–32)
Calcium: 9.9 mg/dL (ref 8.9–10.3)
Chloride: 104 mmol/L (ref 98–111)
Creatinine: 1.28 mg/dL — ABNORMAL HIGH (ref 0.61–1.24)
GFR, Est AFR Am: >60 mL/min (ref >60)
GFR, Estimated: 56 mL/min — ABNORMAL LOW (ref >60)
Glucose, Bld: 143 mg/dL — ABNORMAL HIGH (ref 70–99)
Potassium: 4.3 mmol/L (ref 3.5–5.1)
Sodium: 141 mmol/L (ref 135–145)
Total Bilirubin: 0.3 mg/dL (ref 0.3–1.2)
Total Protein: 7.8 g/dL (ref 6.5–8.1)

## 2020-05-20 ENCOUNTER — Telehealth: Payer: Self-pay | Admitting: Hematology

## 2020-05-20 NOTE — Telephone Encounter (Signed)
Scheduled per 8/19 los. Unable to reach pt. Left voicemail with appt time and date. Stated in voicemail to let pt know that central radiology will contact pt to schedule scan.

## 2020-05-25 ENCOUNTER — Ambulatory Visit: Payer: Medicare HMO | Admitting: Podiatry

## 2020-06-03 ENCOUNTER — Other Ambulatory Visit: Payer: Self-pay | Admitting: *Deleted

## 2020-06-03 DIAGNOSIS — C3490 Malignant neoplasm of unspecified part of unspecified bronchus or lung: Secondary | ICD-10-CM

## 2020-06-03 NOTE — Telephone Encounter (Signed)
Wife requested refill of patient's vicodin/norco

## 2020-06-07 MED ORDER — HYDROCODONE-ACETAMINOPHEN 5-325 MG PO TABS: ORAL_TABLET | ORAL | 0 refills | Status: DC

## 2020-07-08 ENCOUNTER — Other Ambulatory Visit: Payer: Self-pay | Admitting: *Deleted

## 2020-07-08 DIAGNOSIS — C3490 Malignant neoplasm of unspecified part of unspecified bronchus or lung: Secondary | ICD-10-CM

## 2020-07-08 MED ORDER — HYDROCODONE-ACETAMINOPHEN 5-325 MG PO TABS: ORAL_TABLET | ORAL | 0 refills | Status: DC

## 2020-07-08 NOTE — Telephone Encounter (Signed)
Patient request refill of Norco

## 2020-08-09 ENCOUNTER — Other Ambulatory Visit: Payer: Self-pay | Admitting: Hematology

## 2020-08-09 DIAGNOSIS — C3490 Malignant neoplasm of unspecified part of unspecified bronchus or lung: Secondary | ICD-10-CM

## 2020-08-09 MED ORDER — HYDROCODONE-ACETAMINOPHEN 5-325 MG PO TABS: ORAL_TABLET | ORAL | 0 refills | Status: DC

## 2020-08-12 ENCOUNTER — Ambulatory Visit (INDEPENDENT_AMBULATORY_CARE_PROVIDER_SITE_OTHER): Payer: Medicare HMO | Admitting: Podiatry

## 2020-08-12 ENCOUNTER — Encounter: Payer: Self-pay | Admitting: Podiatry

## 2020-08-12 ENCOUNTER — Other Ambulatory Visit: Payer: Self-pay

## 2020-08-12 DIAGNOSIS — M19079 Primary osteoarthritis, unspecified ankle and foot: Secondary | ICD-10-CM

## 2020-08-12 DIAGNOSIS — E119 Type 2 diabetes mellitus without complications: Secondary | ICD-10-CM | POA: Diagnosis not present

## 2020-08-12 DIAGNOSIS — B351 Tinea unguium: Secondary | ICD-10-CM | POA: Diagnosis not present

## 2020-08-12 DIAGNOSIS — M79676 Pain in unspecified toe(s): Secondary | ICD-10-CM | POA: Diagnosis not present

## 2020-08-12 NOTE — Progress Notes (Signed)
Patient ID: YOSGAR DEMIRJIAN, male   DOB: June 25, 1946, 74 y.o.   MRN: [id number] Complaint:  Visit Type: Patient returns to my office for continued preventative foot care services. Complaint: Patient states" my nails have grown long and thick and become painful to walk and wear shoes" . The patient presents for preventative foot care services. No changes to ROS.  Patient presents to the office with his wife.  Podiatric Exam: Vascular: dorsalis pedis and posterior tibial pulses are palpable bilateral. Capillary return is immediate. Temperature gradient is WNL. Skin turgor WNL  Sensorium: Normal Semmes Weinstein monofilament test. Normal tactile sensation bilaterally. Nail Exam: Pt has thick disfigured discolored nails with subungual debris noted bilateral entire nail hallux through fifth toenails Ulcer Exam: There is no evidence of ulcer or pre-ulcerative changes or infection. Orthopedic Exam: Muscle tone and strength are WNL. No limitations in general ROM. No crepitus or effusions noted. Foot type and digits show no abnormalities. Bony prominences are unremarkable. Skin: No Porokeratosis. No infection or ulcers  Diagnosis:  Onychomycosis, , Pain in right toe, pain in left toes  Treatment & Plan Procedures and Treatment: Consent by patient was obtained for treatment procedures. The patient understood the discussion of treatment and procedures well. All questions were answered thoroughly reviewed. Debridement of mycotic and hypertrophic toenails, 1 through 5 bilateral and clearing of subungual debris. No ulceration, no infection noted.  Return Visit-Office Procedure: Patient instructed to return to the office for a follow up visit 3 months for continued evaluation and treatment.  Gardiner Barefoot DPM

## 2020-08-17 ENCOUNTER — Telehealth: Payer: Self-pay | Admitting: Hematology

## 2020-08-17 NOTE — Telephone Encounter (Signed)
Rescheduled 11/26 appointment to 12/08 per provider pal, patient has been called and voicemail was left.

## 2020-08-19 ENCOUNTER — Other Ambulatory Visit: Payer: Self-pay

## 2020-08-19 ENCOUNTER — Telehealth: Payer: Self-pay | Admitting: Hematology

## 2020-08-19 ENCOUNTER — Ambulatory Visit (HOSPITAL_COMMUNITY)
Admission: RE | Admit: 2020-08-19 | Discharge: 2020-08-19 | Disposition: A | Discharge status: Home / Self Care | Payer: Medicare HMO | Source: Ambulatory Visit | Attending: Hematology | Admitting: Hematology

## 2020-08-19 DIAGNOSIS — C3490 Malignant neoplasm of unspecified part of unspecified bronchus or lung: Secondary | ICD-10-CM | POA: Diagnosis present

## 2020-08-19 NOTE — Telephone Encounter (Signed)
Rescheduled 11/26 appointment to December due to provider pal. Called patient and left a voicemail.

## 2020-08-26 ENCOUNTER — Ambulatory Visit: Payer: Medicare HMO | Admitting: Hematology

## 2020-08-26 ENCOUNTER — Other Ambulatory Visit: Payer: Medicare HMO

## 2020-09-02 ENCOUNTER — Other Ambulatory Visit: Payer: Self-pay

## 2020-09-02 ENCOUNTER — Other Ambulatory Visit: Payer: Self-pay | Admitting: Hematology

## 2020-09-02 ENCOUNTER — Other Ambulatory Visit: Payer: Self-pay | Admitting: Physician Assistant

## 2020-09-02 NOTE — Progress Notes (Signed)
Refill requested called checked with pharmacy inhaler is waiting for pt pick up pharmacy will make pt aware

## 2020-09-07 ENCOUNTER — Telehealth: Payer: Self-pay | Admitting: Hematology

## 2020-09-07 ENCOUNTER — Inpatient Hospital Stay: Payer: Medicare HMO | Admitting: Hematology

## 2020-09-07 ENCOUNTER — Inpatient Hospital Stay: Payer: Medicare HMO

## 2020-09-07 NOTE — Progress Notes (Incomplete)
HEMATOLOGY/ONCOLOGY CLINIC NOTE  Date of Service: 09/07/2020  Patient Care Team: Bernerd Limbo, MD as PCP - General (Family Medicine) Sherren Mocha, MD as PCP - Cardiology (Cardiology)  CHIEF COMPLAINTS/PURPOSE OF CONSULTATION:  F/u for mx of lung cancer  HISTORY OF PRESENTING ILLNESS:   Barry Booker is a wonderful 74 y.o. male who has been referred to [place] by Dr Marshell Garfinkel for evaluation and management of presumed newly diagnosed rt sided lung cancer with concern for bronchial obstruction.  Patient has a history of hypertension, dyslipidemia, diabetes, COPD, alcohol abuse and previously had a chest x-ray on 02/20/2018 at [facility] which showed Right upper lobe consolidation/atelectasis may relate to post obstructive pneumonitis/pneumonia. An underlying obstructing lesion should be considered. Consider CT for further assessment.  H subsequently then had a CT of the chest on 02/27/2018 which showed -5.8 x 4.9 cm mass in the right mediastinum and hilum with associated severe narrowing of the right main pulmonary artery as well as right upper lobe and right middle lobe arteries. Obstructed right main stem bronchus and collapse of the right upper lobe and right middle lobe. Small right effusion. The appearance is similar to the patient's chest radiograph from 02/20/2018.  Patient subsequently had a PET CT on 03/10/2018 by Dr. Vaughan Browner which showed Large intensely hypermetabolic RIGHT hilar mass obstructs the RIGHT upper lobe bronchus and surrounds the bronchus intermedius. Post obstructive collapse of the RIGHT upper lobe.  No hypermetabolic mediastinal or supraclavicular lymph nodes. No evidence distant metastatic disease.  Patient had a flexible video fiberoptic bronchoscopy done on 03/21/2018 by Dr. Vaughan Browner which showed which showed some nodularity of the right mainstem bronchial mucosa with near complete occlusion of right main stem. Unable to traverse the occluded part of right main stem. The  mucosa was friable with propensity to bleed. Endobronchial biopsies and brushings were taken from the R mainstem and right upper lobe for pathology.  Preliminary results showed atypical cells however final cytology/pathology is currently pending.  Patient report rt sided chest pain with deep inspiration, anorexia and weight loss of about 30lbs in the last 6 months.  Interval History:  Barry Booker returns to clinic today for management and evaluation of his lung cancer. We are joined today by his wife, Patsy.*** The patient's last visit with [place] was on 05/19/2020. The pt reports that he is doing well overall.  The pt reports ***  Of note since the patient's last visit, pt has had CT Chest ([id number]) completed on 08/19/2020 with results revealing "Radiation changes in the right hemithorax. No findings specific for recurrent or metastatic disease. 3 mm subpleural nodule in the posterior right lower lobe, likely benign. Small right pleural effusion, decreased. Aortic Atherosclerosis."  Lab results today (09/07/20) of CBC w/diff and CMP is as follows: all values are WNL except for ***.  On review of systems, pt reports *** and denies *** and any other symptoms.   A&P: -Discussed pt labwork today, 09/07/20; *** -***  MEDICAL HISTORY:  Past Medical History:  Diagnosis Date  . Alcohol abuse   . COPD (chronic obstructive pulmonary disease) ([place])   . Diabetes mellitus without complication ([place])   . Echocardiogram    Echo 11/2018: EF 45-50, mild MAC, mild calc of AoV (difficult study) >> Limited echo with Definity contrast 11/2018: EF 50-55  . Gout   . High cholesterol   . Hypertension   . Malignant neoplasm of right upper lobe of lung ([place]) 03/29/2018    SURGICAL  HISTORY: Past Surgical History:  Procedure Laterality Date  . COLONOSCOPY    . FLEXIBLE BRONCHOSCOPY [name] 03/21/2018   Procedure: FLEXIBLE BRONCHOSCOPY;  Surgeon: Marshell Garfinkel, MD;  Location: [place];  Service: Pulmonary;   Laterality: [name];  . NOSE SURGERY  90's  . VIDEO BRONCHOSCOPY WITH ENDOBRONCHIAL ULTRASOUND [name] 03/21/2018   Procedure: VIDEO BRONCHOSCOPY WITH ENDOBRONCHIAL ULTRASOUND;  Surgeon: Marshell Garfinkel, MD;  Location: [place];  Service: Pulmonary;  Laterality: [name];    SOCIAL HISTORY: Social History   Socioeconomic History  . Marital status: Married    Spouse name: Not on file  . Number of children: Not on file  . Years of education: Not on file  . Highest education level: Not on file  Occupational History  . Not on file  Tobacco Use  . Smoking status: Former Smoker    Packs/day: 0.25    Years: 50.00    Pack years: 12.50    Types: Cigarettes    Quit date: 07/2017    Years since quitting: 3.1  . Smokeless tobacco: Never Used  . Tobacco comment: quit smoking 07/2017  Vaping Use  . Vaping Use: Never used  Substance and Sexual Activity  . Alcohol use: Yes    Alcohol/week: 14.0 - 21.0 standard drinks    Types: 14 - 21 Cans of beer per week  . Drug use: No  . Sexual activity: Not on file  Other Topics Concern  . Not on file  Social History Narrative  . Not on file   Social Determinants of Health   Financial Resource Strain:   . Difficulty of Paying Living Expenses: Not on file  Food Insecurity:   . Worried About Charity fundraiser in the Last Year: Not on file  . Ran Out of Food in the Last Year: Not on file  Transportation Needs:   . Lack of Transportation (Medical): Not on file  . Lack of Transportation (Non-Medical): Not on file  Physical Activity:   . Days of Exercise per Week: Not on file  . Minutes of Exercise per Session: Not on file  Stress:   . Feeling of Stress : Not on file  Social Connections:   . Frequency of Communication with Friends and Family: Not on file  . Frequency of Social Gatherings with Friends and Family: Not on file  . Attends Religious Services: Not on file  . Active Member of Clubs or Organizations: Not on file  . Attends Archivist  Meetings: Not on file  . Marital Status: Not on file  Intimate Partner Violence:   . Fear of Current or Ex-Partner: Not on file  . Emotionally Abused: Not on file  . Physically Abused: Not on file  . Sexually Abused: Not on file    FAMILY HISTORY: Family History  Problem Relation Age of Onset  . Arthritis Mother   . Cancer Father   . Cancer Sister   . Heart disease Maternal Uncle   . Colon cancer Neg Hx     ALLERGIES:  is allergic to neomycin-bacitracin zn-polymyx.  MEDICATIONS:  Current Outpatient Medications  Medication Sig Dispense Refill  . albuterol (VENTOLIN HFA) 108 (90 Base) MCG/ACT inhaler INHALE 1 TO 2 PUFFS INTO THE LUNGS EVERY 4 HOURS AS NEEDED FOR WHEEZING OR SHORTNESS OF BREATH 6.7 each 2  . ANORO ELLIPTA 62.5-25 MCG/INH AEPB INHALE 1 PUFF INTO THE LUNGS DAILY 60 each 1  . aspirin 81 MG tablet Take 81 mg by mouth daily.    [name]  atorvastatin (LIPITOR) 40 MG tablet Take 40 mg by mouth daily.     . benazepril (LOTENSIN) 20 MG tablet Take 20 mg by mouth daily.     . diphenhydrAMINE (BENADRYL) 12.5 MG/5ML elixir Take 12.5 mg by mouth daily as needed for allergies.     . folic acid (FOLVITE) 465 MCG tablet Take 400 mcg by mouth daily.    . furosemide (LASIX) 40 MG tablet Take 40 mg by mouth daily.     [name] HYDROcodone-acetaminophen (NORCO) 5-325 MG tablet 1/2 to 1 tablet 4 times daily as needed for pain. 60 tablet 0  . ibuprofen (ADVIL,MOTRIN) 200 MG tablet Take 400 mg by mouth every 6 (six) hours as needed for headache, mild pain or moderate pain.    [name] levothyroxine (SYNTHROID) 25 MCG tablet TAKE 1 TABLET BY MOUTH DAILY BEFORE BREAKFAST. (Patient taking differently: 75 mcg. ) 90 tablet 1  . metFORMIN (GLUCOPHAGE) 500 MG tablet Take 500 mg by mouth every morning.    . metoprolol succinate (TOPROL-XL) 25 MG 24 hr tablet TAKE 1 TABLET BY MOUTH DAILY. PLEASE MAKE OVERDUE APPT WITH DR. BEFORE ANYMORE REFILLS 90 tablet 0  . potassium chloride SA (K-DUR) 20 MEQ tablet Take 20 mEq by  mouth daily.    . prochlorperazine (COMPAZINE) 10 MG tablet Take 10 mg by mouth every 6 (six) hours as needed for nausea or vomiting.     [name] UNABLE TO FIND Take by mouth.     No current facility-administered medications for this visit.    REVIEW OF SYSTEMS:   A 10+ POINT REVIEW OF SYSTEMS WAS OBTAINED including neurology, dermatology, psychiatry, cardiac, respiratory, lymph, extremities, GI, GU, Musculoskeletal, constitutional, breasts, reproductive, HEENT.  All pertinent positives are noted in the HPI.  All others are negative.   PHYSICAL EXAMINATION: There were no vitals filed for this visit. Wt Readings from Last 3 Encounters:  05/19/20 241 lb 1.6 oz (109.4 kg)  01/11/20 244 lb 1.6 oz (110.7 kg)  11/30/19 243 lb (110.2 kg)   There is no height or weight on file to calculate BMI.    *** GENERAL:alert, in no acute distress and comfortable SKIN: no acute rashes, no significant lesions EYES: conjunctiva are pink and non-injected, sclera anicteric OROPHARYNX: MMM, no exudates, no oropharyngeal erythema or ulceration NECK: supple, no JVD LYMPH:  no palpable lymphadenopathy in the cervical, axillary or inguinal regions LUNGS: clear to auscultation b/l with normal respiratory effort HEART: regular rate & rhythm ABDOMEN:  normoactive bowel sounds , non tender, not distended. No palpable hepatosplenomegaly.  Extremity: no pedal edema PSYCH: alert & oriented x 3 with fluent speech NEURO: no focal motor/sensory deficits  LABORATORY DATA:  I have reviewed the data as listed . CBC Latest Ref Rng & Units 05/19/2020 11/30/2019 08/05/2019  WBC 4.0 - 10.5 K/uL 8.9 10.9(H) 7.3  Hemoglobin 13.0 - 17.0 g/dL 12.9(L) 13.0 13.0  Hematocrit 39 - 52 % 41.0 41.5 40.1  Platelets 150 - 400 K/uL 153 172 146(L)    . CMP Latest Ref Rng & Units 05/19/2020 11/30/2019 08/05/2019  Glucose 70 - 99 mg/dL 143(H) 123(H) 153(H)  BUN 8 - 23 mg/dL 28(H) 17 19  Creatinine 0.61 - 1.24 mg/dL 1.28(H) 1.10 1.11  Sodium  135 - 145 mmol/L 141 140 139  Potassium 3.5 - 5.1 mmol/L 4.3 4.3 4.2  Chloride 98 - 111 mmol/L 104 104 102  CO2 22 - 32 mmol/L _0 Calcium 8.9 - 10.3 mg/dL 9.9 9.4 9.7  Total  Protein 6.5 - 8.1 g/dL 7.8 8.0 7.7  Total Bilirubin 0.3 - 1.2 mg/dL 0.3 0.5 0.3  Alkaline Phos 38 - 126 U/L 66 79 62  AST 15 - 41 U/L 11(L) 12(L) 20  ALT 0 - 44 U/L _0 08/10/2019  08/10/2019 Cytology- PAP  03/25/18 Endobronchial bx:     RADIOGRAPHIC STUDIES: I have personally reviewed the radiological images as listed and agreed with the findings in the report. CT Chest Wo Contrast  Result Date: 08/21/2020 CLINICAL DATA:  Non-small cell lung cancer, renal therapy ongoing EXAM: CT CHEST WITHOUT CONTRAST TECHNIQUE: Multidetector CT imaging of the chest was performed following the standard protocol without IV contrast. COMPARISON:  CT chest abdomen pelvis dated 12/31/2019 FINDINGS: Cardiovascular: The heart is normal in size. No pericardial effusion. No evidence of thoracic aortic aneurysm. Atherosclerotic calcifications of the aortic arch. Coronary atherosclerosis the LAD and left circumflex. Mediastinum/Nodes: No suspicious mediastinal lymphadenopathy. Visualized thyroid is unremarkable. Lungs/Pleura: Radiation changes in the posterior right upper lobe and right paramediastinal lung. Volume loss in the right hemithorax. Small right pleural effusion, decreased. 3 mm subpleural nodule in the posterior right lower lobe (series 5/image 64), not visualized on the prior, although likely obscured by the effusion. Perifissural nodularity in the posterior left upper lobe (series 5/image 23) is unchanged and benign. Minimal left basilar nodularity (series 5/image 129) is also unchanged and favors nodular scarring. No focal consolidation. No pneumothorax. Upper Abdomen: Visualized upper abdomen is notable for cirrhosis and vascular calcifications. Musculoskeletal: Mild degenerative changes of the visualized  thoracolumbar spine. Stable anterior wedging at L1. IMPRESSION: Radiation changes in the right hemithorax. No findings specific for recurrent or metastatic disease. 3 mm subpleural nodule in the posterior right lower lobe, likely benign. Attention on follow-up is suggested. Small right pleural effusion, decreased. Aortic Atherosclerosis (ICD10-I70.0). Electronically Signed   By: Julian Hy M.D.   On: 08/21/2020 09:00    ASSESSMENT & PLAN:   74 y.o.  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 Post obstructive collapse of the Right upper lobe. No hypermetabolic mediastinal or supraclavicular lymph nodes. No evidence of distant mets.  06/23/18 PET/CT revealed  Marked reduction in activity associated with the dominant right upper lobe central mass, maximum SUV 3.4 and previously 15.3. Please note that this lesion does still in case and markedly narrows the right upper lobe bronchus. 2. There is some patchy ground-glass opacities with small airspace opacity components in the right upper lobe, right middle lobe, right lower lobe, and lingula. Some of these are hypermetabolic, for example lingular opacity has a maximum SUV of 6.2 and the right lower lobe bandlike opacity 5.7. These are probably inflammatory and merit surveillance. 3. Small to moderate right pleural effusion with accentuated metabolic activity. Malignant effusion not excluded. 4. No findings of metastatic disease to the neck, abdomen/pelvis, or skeleton. 5. Aortic aneurysm NOS. Infrarenal aortic aneurysm 3.8 cm in diameter. Recommend followup by [place] in 2 years. 6. Other imaging findings of potential clinical  significance: Chronic paranasal sinusitis. Aortic Atherosclerosis. Coronary atherosclerosis. Hepatic cirrhosis. Ventral hernia containing a loop of small bowel. Suspected late phase healing of the right anterior eighth rib.   09/18/18 CT Chest revealed Rarely  relatively stable perihilar consolidation on the RIGHT consistent post treatment change. Persistent narrowing of the proximal bronchi. 2. Interval increase in interstitial thickening in the RIGHT upper lobe. Differential include postobstructive process versus lymphangitic carcinoma. Favor postobstructive process. 3. Interval increase in RIGHT pleural effusion. 4. With potential progressive malignant findings versus progressive benign change, consider follow-up FDG PET scan.   10/02/18 Cytology from thoracentesis showed no malignant cells. 10/02/18 CXR revealed No evidence of pneumothorax after RIGHT thoracentesis. 2. Persistent large, loculated RIGHT pleural effusion and associated dense passive atelectasis in the RIGHT LOWER LOBE. 3. No new abnormalities.  10/29/18 CT Chest revealed Interval decrease in right pleural effusion with residual loculated component seen anteriorly and medially. 2. The interstitial and airspace disease in the posterior right upper lung is similar to prior. 3. Stable volume loss right hemithorax with right parahilar interstitial and airspace disease likely reflecting radiation fibrosis. 4. Nodular liver morphology suggests cirrhosis.  S/p 5 cycles of Durvalumab  03/06/19 CT Chest revealed "Stable appearing extensive radiation changes and treated tumor in the right upper lobe and right paramediastinal lung and right hilum. I do not see any definite CT findings for progressive disease. 2. Stable loculated right pleural fluid collections. 3. No CT findings to suggest metastatic pulmonary nodules in the left lung or metastatic disease involving the upper abdomen. 4. Stable cirrhotic changes involving the liver. Aortic Atherosclerosis  and Emphysema."  11/24/18 ECHO revealed LV EF of 50-55%  12/31/19 CT C/A/P  ([id number]) ([id number]) revealed "1. Stable appearance of treated tumor within the right perihilar and perihilar right lung. No specific findings identified to suggest local tumor recurrence. 2. Decrease in size of loculated right pleural effusion. 3. Cirrhosis. 4. Infrarenal abdominal aortic aneurysm."  2) RUL airway obstruction with RUL lung collapse.  3) Concern for severe narrowing of the right main pulmonary artery as well as right upper lobe and right middle lobe arteries.   4.  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 Will continue watchful observation.***  -Continue 2000 IU Vitamin D daily  -Counseled again on smoking cessation.   FOLLOW UP: ***   The total time spent in the appt was *** minutes and more than 50% was on counseling and direct patient cares.  All of the patient's questions were answered with apparent satisfaction. The patient knows to call the clinic with any problems, questions or concerns.   Sullivan Lone MD [place] AAHIVMS [facility] Hematology/Oncology Physician [facility]  (Office):       [phone] (Work cell):  [phone] (Fax):           [phone]   I, Yevette Edwards, am acting as a scribe for Dr. Sullivan Lone.   {Add Barista Statement}

## 2020-09-07 NOTE — Telephone Encounter (Signed)
Called pt per 12/8 sch msg - left message for pt to call back to reschedule.

## 2020-09-09 ENCOUNTER — Telehealth: Payer: Self-pay | Admitting: *Deleted

## 2020-09-09 ENCOUNTER — Other Ambulatory Visit: Payer: Self-pay | Admitting: *Deleted

## 2020-09-09 ENCOUNTER — Telehealth: Payer: Self-pay | Admitting: Hematology

## 2020-09-09 DIAGNOSIS — C3490 Malignant neoplasm of unspecified part of unspecified bronchus or lung: Secondary | ICD-10-CM

## 2020-09-09 MED ORDER — HYDROCODONE-ACETAMINOPHEN 5-325 MG PO TABS: ORAL_TABLET | ORAL | 0 refills | Status: DC

## 2020-09-09 NOTE — Telephone Encounter (Signed)
Ms. Liptak called - she needs to reschedule patient's appt for follow up. She cancelled appts for lab and to see Dr. Irene Limbo on 12/8. Schedule message sent - she asked for appt to be in morning and for scheduling to call [phone] (home#).

## 2020-09-09 NOTE — Telephone Encounter (Signed)
Called pt per 12/10 sch msg- pt wife is aware of appt date and time

## 2020-09-09 NOTE — Telephone Encounter (Signed)
Patient spouse requested refill of patient's pain med

## 2020-10-07 ENCOUNTER — Telehealth: Payer: Self-pay | Admitting: Hematology

## 2020-10-07 ENCOUNTER — Other Ambulatory Visit: Payer: Medicare HMO

## 2020-10-07 ENCOUNTER — Ambulatory Visit: Payer: Medicare HMO | Admitting: Hematology

## 2020-10-07 ENCOUNTER — Telehealth: Payer: Self-pay | Admitting: *Deleted

## 2020-10-07 NOTE — Progress Notes (Incomplete)
HEMATOLOGY/ONCOLOGY CLINIC NOTE  Date of Service: 10/07/2020  Patient Care Team: Bernerd Limbo, MD as PCP - General (Family Medicine) Sherren Mocha, MD as PCP - Cardiology (Cardiology)  CHIEF COMPLAINTS/PURPOSE OF CONSULTATION:  F/u for mx of lung cancer  HISTORY OF PRESENTING ILLNESS:   Barry Booker is a wonderful 75 y.o. male who has been referred to [place] by Dr Marshell Garfinkel for evaluation and management of presumed newly diagnosed rt sided lung cancer with concern for bronchial obstruction.  Patient has a history of hypertension, dyslipidemia, diabetes, COPD, alcohol abuse and previously had a chest x-ray on 02/20/2018 at [facility] which showed Right upper lobe consolidation/atelectasis may relate to post obstructive pneumonitis/pneumonia. An underlying obstructing lesion should be considered. Consider CT for further assessment.  H subsequently then had a CT of the chest on 02/27/2018 which showed -5.8 x 4.9 cm mass in the right mediastinum and hilum with associated severe narrowing of the right main pulmonary artery as well as right upper lobe and right middle lobe arteries. Obstructed right main stem bronchus and collapse of the right upper lobe and right middle lobe. Small right effusion. The appearance is similar to the patient's chest radiograph from 02/20/2018.  Patient subsequently had a PET CT on 03/10/2018 by Dr. Vaughan Browner which showed Large intensely hypermetabolic RIGHT hilar mass obstructs the RIGHT upper lobe bronchus and surrounds the bronchus intermedius. Post obstructive collapse of the RIGHT upper lobe.  No hypermetabolic mediastinal or supraclavicular lymph nodes. No evidence distant metastatic disease.  Patient had a flexible video fiberoptic bronchoscopy done on 03/21/2018 by Dr. Vaughan Browner which showed which showed some nodularity of the right mainstem bronchial mucosa with near complete occlusion of right main stem. Unable to traverse the occluded part of right main stem. The  mucosa was friable with propensity to bleed. Endobronchial biopsies and brushings were taken from the R mainstem and right upper lobe for pathology.  Preliminary results showed atypical cells however final cytology/pathology is currently pending.  Patient report rt sided chest pain with deep inspiration, anorexia and weight loss of about 30lbs in the last 6 months.  Interval History:  Barry Booker returns to clinic today for management and evaluation of his lung cancer. We are joined today by his wife, Patsy.*** The patient's last visit with [place] was on 05/19/2020. The pt reports that he is doing well overall.  The pt reports ***  Of note since the patient's last visit, pt has had CT Chest ([id number]) completed on 08/19/2020 with results revealing "Radiation changes in the right hemithorax. No findings specific for recurrent or metastatic disease. 3 mm subpleural nodule in the posterior right lower lobe, likely benign. Attention on follow-up is suggested. Small right pleural effusion, decreased. Aortic Atherosclerosis."  Lab results today (10/07/20) of CBC w/diff and CMP is as follows: all values are WNL except for ***.  On review of systems, pt reports *** and denies ***and any other symptoms.   A&P: -Discussed pt labwork today, 10/07/20; *** -***  MEDICAL HISTORY:  Past Medical History:  Diagnosis Date  . Alcohol abuse   . COPD (chronic obstructive pulmonary disease) ([place])   . Diabetes mellitus without complication ([place])   . Echocardiogram    Echo 11/2018: EF 45-50, mild MAC, mild calc of AoV (difficult study) >> Limited echo with Definity contrast 11/2018: EF 50-55  . Gout   . High cholesterol   . Hypertension   . Malignant neoplasm of right upper lobe of lung ([place]) 03/29/2018  SURGICAL HISTORY: Past Surgical History:  Procedure Laterality Date  . COLONOSCOPY    . FLEXIBLE BRONCHOSCOPY [name] 03/21/2018   Procedure: FLEXIBLE BRONCHOSCOPY;  Surgeon: Marshell Garfinkel, MD;   Location: [place];  Service: Pulmonary;  Laterality: [name];  . NOSE SURGERY  90's  . VIDEO BRONCHOSCOPY WITH ENDOBRONCHIAL ULTRASOUND [name] 03/21/2018   Procedure: VIDEO BRONCHOSCOPY WITH ENDOBRONCHIAL ULTRASOUND;  Surgeon: Marshell Garfinkel, MD;  Location: [place];  Service: Pulmonary;  Laterality: [name];    SOCIAL HISTORY: Social History   Socioeconomic History  . Marital status: Married    Spouse name: Not on file  . Number of children: Not on file  . Years of education: Not on file  . Highest education level: Not on file  Occupational History  . Not on file  Tobacco Use  . Smoking status: Former Smoker    Packs/day: 0.25    Years: 50.00    Pack years: 12.50    Types: Cigarettes    Quit date: 07/2017    Years since quitting: 3.2  . Smokeless tobacco: Never Used  . Tobacco comment: quit smoking 07/2017  Vaping Use  . Vaping Use: Never used  Substance and Sexual Activity  . Alcohol use: Yes    Alcohol/week: 14.0 - 21.0 standard drinks    Types: 14 - 21 Cans of beer per week  . Drug use: No  . Sexual activity: Not on file  Other Topics Concern  . Not on file  Social History Narrative  . Not on file   Social Determinants of Health   Financial Resource Strain: Not on file  Food Insecurity: Not on file  Transportation Needs: Not on file  Physical Activity: Not on file  Stress: Not on file  Social Connections: Not on file  Intimate Partner Violence: Not on file    FAMILY HISTORY: Family History  Problem Relation Age of Onset  . Arthritis Mother   . Cancer Father   . Cancer Sister   . Heart disease Maternal Uncle   . Colon cancer Neg Hx     ALLERGIES:  is allergic to neomycin-bacitracin zn-polymyx.  MEDICATIONS:  Current Outpatient Medications  Medication Sig Dispense Refill  . albuterol (VENTOLIN HFA) 108 (90 Base) MCG/ACT inhaler INHALE 1 TO 2 PUFFS INTO THE LUNGS EVERY 4 HOURS AS NEEDED FOR WHEEZING OR SHORTNESS OF BREATH 6.7 each 2  . ANORO ELLIPTA 62.5-25 MCG/INH  AEPB INHALE 1 PUFF INTO THE LUNGS DAILY 60 each 1  . aspirin 81 MG tablet Take 81 mg by mouth daily.    [name] atorvastatin (LIPITOR) 40 MG tablet Take 40 mg by mouth daily.     . benazepril (LOTENSIN) 20 MG tablet Take 20 mg by mouth daily.     . diphenhydrAMINE (BENADRYL) 12.5 MG/5ML elixir Take 12.5 mg by mouth daily as needed for allergies.     . folic acid (FOLVITE) 786 MCG tablet Take 400 mcg by mouth daily.    . furosemide (LASIX) 40 MG tablet Take 40 mg by mouth daily.     [name] HYDROcodone-acetaminophen (NORCO) 5-325 MG tablet 1/2 to 1 tablet 4 times daily as needed for pain. 60 tablet 0  . ibuprofen (ADVIL,MOTRIN) 200 MG tablet Take 400 mg by mouth every 6 (six) hours as needed for headache, mild pain or moderate pain.    [name] levothyroxine (SYNTHROID) 25 MCG tablet TAKE 1 TABLET BY MOUTH DAILY BEFORE BREAKFAST. (Patient taking differently: 75 mcg. ) 90 tablet 1  . metFORMIN (GLUCOPHAGE) 500 MG  tablet Take 500 mg by mouth every morning.    . metoprolol succinate (TOPROL-XL) 25 MG 24 hr tablet TAKE 1 TABLET BY MOUTH DAILY. PLEASE MAKE OVERDUE APPT WITH DR. BEFORE ANYMORE REFILLS 90 tablet 0  . potassium chloride SA (K-DUR) 20 MEQ tablet Take 20 mEq by mouth daily.    . prochlorperazine (COMPAZINE) 10 MG tablet Take 10 mg by mouth every 6 (six) hours as needed for nausea or vomiting.     [name] UNABLE TO FIND Take by mouth.     No current facility-administered medications for this visit.    REVIEW OF SYSTEMS:   A 10+ POINT REVIEW OF SYSTEMS WAS OBTAINED including neurology, dermatology, psychiatry, cardiac, respiratory, lymph, extremities, GI, GU, Musculoskeletal, constitutional, breasts, reproductive, HEENT.  All pertinent positives are noted in the HPI.  All others are negative.   PHYSICAL EXAMINATION: There were no vitals filed for this visit. Wt Readings from Last 3 Encounters:  05/19/20 241 lb 1.6 oz (109.4 kg)  01/11/20 244 lb 1.6 oz (110.7 kg)  11/30/19 243 lb (110.2 kg)   There is no  height or weight on file to calculate BMI.    *** GENERAL:alert, in no acute distress and comfortable SKIN: no acute rashes, no significant lesions EYES: conjunctiva are pink and non-injected, sclera anicteric OROPHARYNX: MMM, no exudates, no oropharyngeal erythema or ulceration NECK: supple, no JVD LYMPH:  no palpable lymphadenopathy in the cervical, axillary or inguinal regions LUNGS: clear to auscultation b/l with normal respiratory effort HEART: regular rate & rhythm ABDOMEN:  normoactive bowel sounds , non tender, not distended. No palpable hepatosplenomegaly.  Extremity: no pedal edema PSYCH: alert & oriented x 3 with fluent speech NEURO: no focal motor/sensory deficits  LABORATORY DATA:  I have reviewed the data as listed . CBC Latest Ref Rng & Units 05/19/2020 11/30/2019 08/05/2019  WBC 4.0 - 10.5 K/uL 8.9 10.9(H) 7.3  Hemoglobin 13.0 - 17.0 g/dL 12.9(L) 13.0 13.0  Hematocrit 39.0 - 52.0 % 41.0 41.5 40.1  Platelets 150 - 400 K/uL 153 172 146(L)    . CMP Latest Ref Rng & Units 05/19/2020 11/30/2019 08/05/2019  Glucose 70 - 99 mg/dL 143(H) 123(H) 153(H)  BUN 8 - 23 mg/dL 28(H) 17 19  Creatinine 0.61 - 1.24 mg/dL 1.28(H) 1.10 1.11  Sodium 135 - 145 mmol/L 141 140 139  Potassium 3.5 - 5.1 mmol/L 4.3 4.3 4.2  Chloride 98 - 111 mmol/L 104 104 102  CO2 22 - 32 mmol/L _0 Calcium 8.9 - 10.3 mg/dL 9.9 9.4 9.7  Total Protein 6.5 - 8.1 g/dL 7.8 8.0 7.7  Total Bilirubin 0.3 - 1.2 mg/dL 0.3 0.5 0.3  Alkaline Phos 38 - 126 U/L 66 79 62  AST 15 - 41 U/L 11(L) 12(L) 20  ALT 0 - 44 U/L _1 08/10/2019  08/10/2019 Cytology- PAP  03/25/18 Endobronchial bx:     RADIOGRAPHIC STUDIES: I have personally reviewed the radiological images as listed and agreed with the findings in the report. No results found.  ASSESSMENT & PLAN:   75 y.o.  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 Post obstructive collapse of the Right upper lobe. No hypermetabolic mediastinal or  supraclavicular lymph nodes. No evidence of distant mets.  06/23/18 PET/CT revealed  Marked reduction in activity associated with the dominant right upper lobe central mass, maximum SUV 3.4 and previously 15.3. Please note that this lesion does still in case and markedly narrows the right upper lobe bronchus. 2. There is some patchy ground-glass opacities with small airspace opacity components in the right upper lobe, right middle lobe, right lower lobe, and lingula. Some of these are hypermetabolic, for example lingular opacity has a maximum SUV of 6.2 and the right lower lobe bandlike opacity 5.7. These are probably inflammatory and merit surveillance. 3. Small to moderate right pleural effusion with accentuated metabolic activity. Malignant effusion not excluded. 4. No findings of metastatic disease to the neck, abdomen/pelvis, or skeleton. 5. Aortic aneurysm NOS. Infrarenal aortic aneurysm 3.8 cm in diameter. Recommend followup by [place] in 2 years. 6. Other imaging findings of potential clinical significance: Chronic paranasal sinusitis. Aortic Atherosclerosis. Coronary atherosclerosis. Hepatic cirrhosis. Ventral hernia containing a loop of small bowel. Suspected late phase healing of the right anterior eighth rib.   09/18/18 CT Chest revealed Rarely relatively stable perihilar consolidation on the RIGHT consistent post treatment change. Persistent narrowing of the proximal bronchi. 2. Interval increase in interstitial thickening in the RIGHT upper lobe. Differential include postobstructive process versus lymphangitic carcinoma. Favor postobstructive process. 3. Interval increase in  RIGHT pleural effusion. 4. With potential progressive malignant findings versus progressive benign change, consider follow-up FDG PET scan.   10/02/18 Cytology from thoracentesis showed no malignant cells. 10/02/18 CXR revealed No evidence of pneumothorax after RIGHT thoracentesis. 2. Persistent large, loculated RIGHT pleural effusion and associated dense passive atelectasis in the RIGHT LOWER LOBE. 3. No new abnormalities.  10/29/18 CT Chest revealed Interval decrease in right pleural effusion with residual loculated component seen anteriorly and medially. 2. The interstitial and airspace disease in the posterior right upper lung is similar to prior. 3. Stable volume loss right hemithorax with right parahilar interstitial and airspace disease likely reflecting radiation fibrosis. 4. Nodular liver morphology suggests cirrhosis.  S/p 5 cycles of Durvalumab  03/06/19 CT Chest revealed "Stable appearing extensive radiation changes and treated tumor in the right upper lobe and right paramediastinal lung and right hilum. I do not see any definite CT findings for progressive disease. 2. Stable loculated right pleural fluid collections. 3. No CT findings to suggest metastatic pulmonary nodules in the left lung or metastatic disease involving the upper abdomen. 4. Stable cirrhotic changes involving the liver. Aortic Atherosclerosis and Emphysema."  11/24/18 ECHO revealed LV EF of 50-55%  12/31/19 CT C/A/P  ([id number]) ([id number]) revealed "1. Stable appearance of treated tumor within the right perihilar and perihilar right lung. No specific findings identified to suggest local tumor recurrence. 2. Decrease in size of loculated right pleural effusion. 3. Cirrhosis. 4. Infrarenal abdominal aortic aneurysm."  2) RUL airway obstruction with RUL lung collapse.  3) Concern for severe narrowing of the right main pulmonary artery as well as right upper lobe and right middle lobe arteries.   4.  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 Will continue watchful observation.*** -Continue 2000 IU Vitamin D daily  -Again counseled on smoking cessation.  FOLLOW UP: ***   The total time spent in the appt was *** minutes and more than 50% was on counseling and direct patient cares.  All of the patient's questions were answered with apparent satisfaction. The patient knows to call the clinic with any problems, questions or concerns.   Sullivan Lone MD [place] AAHIVMS [facility] Hematology/Oncology Physician [facility]  (Office):       [phone] (Work cell):  [phone] (Fax):           [phone]   I, Yevette Edwards, am acting as a scribe for Dr. Sullivan Lone.   {Add Barista Statement}

## 2020-10-07 NOTE — Telephone Encounter (Signed)
scheduled appt per 1/7 sch msg - pt wife is aware of appt.

## 2020-10-07 NOTE — Telephone Encounter (Signed)
Barry Booker called at [place] Thursday eve. Said patient had been to PCP and had a check up with labs and PCP reviewed CT scan with them (said it looked good). She said since it was supposed to be really cold on Friday, she thought they should reschedule Barry Booker appt. Patient had CBC and CMP completed on 10/06/20. Appts for 1/7 for lab/Dr. Irene Limbo cancelled. Will r/s for next week. Will inquire if additional labs are needed. Dr. Irene Limbo informed.  Per Dr. Irene Limbo - he will review labs and patient can be rescheduled for later in the month. Patient and wife informed verbalized understanding.

## 2020-10-10 ENCOUNTER — Other Ambulatory Visit: Payer: Self-pay

## 2020-10-10 ENCOUNTER — Inpatient Hospital Stay: Payer: Medicare HMO | Attending: Hematology | Admitting: Hematology

## 2020-10-10 VITALS — BP 107/71 | HR 91 | Temp 97.1°F | Resp 18 | Ht 70.0 in | Wt 241.1 lb

## 2020-10-10 DIAGNOSIS — I10 Essential (primary) hypertension: Secondary | ICD-10-CM | POA: Diagnosis not present

## 2020-10-10 DIAGNOSIS — Z7984 Long term (current) use of oral hypoglycemic drugs: Secondary | ICD-10-CM | POA: Insufficient documentation

## 2020-10-10 DIAGNOSIS — C3491 Malignant neoplasm of unspecified part of right bronchus or lung: Secondary | ICD-10-CM | POA: Insufficient documentation

## 2020-10-10 DIAGNOSIS — J449 Chronic obstructive pulmonary disease, unspecified: Secondary | ICD-10-CM | POA: Diagnosis not present

## 2020-10-10 DIAGNOSIS — C3490 Malignant neoplasm of unspecified part of unspecified bronchus or lung: Secondary | ICD-10-CM

## 2020-10-10 DIAGNOSIS — Z87891 Personal history of nicotine dependence: Secondary | ICD-10-CM | POA: Insufficient documentation

## 2020-10-10 DIAGNOSIS — E119 Type 2 diabetes mellitus without complications: Secondary | ICD-10-CM | POA: Insufficient documentation

## 2020-10-10 MED ORDER — HYDROCODONE-ACETAMINOPHEN 5-325 MG PO TABS: ORAL_TABLET | ORAL | 0 refills | Status: DC

## 2020-10-10 NOTE — Progress Notes (Signed)
HEMATOLOGY/ONCOLOGY CLINIC NOTE  Date of Service: 10/10/2020  Patient Care Team: Bernerd Limbo, MD as PCP - General (Family Medicine) Sherren Mocha, MD as PCP - Cardiology (Cardiology)  CHIEF COMPLAINTS/PURPOSE OF CONSULTATION:  F/u for mx of lung cancer  HISTORY OF PRESENTING ILLNESS:   Barry Booker is a wonderful 75 y.o. male who has been referred to [place] by Dr Marshell Garfinkel for evaluation and management of presumed newly diagnosed rt sided lung cancer with concern for bronchial obstruction.  Patient has a history of hypertension, dyslipidemia, diabetes, COPD, alcohol abuse and previously had a chest x-ray on 02/20/2018 at [facility] which showed Right upper lobe consolidation/atelectasis may relate to post obstructive pneumonitis/pneumonia. An underlying obstructing lesion should be considered. Consider CT for further assessment.  H subsequently then had a CT of the chest on 02/27/2018 which showed -5.8 x 4.9 cm mass in the right mediastinum and hilum with associated severe narrowing of the right main pulmonary artery as well as right upper lobe and right middle lobe arteries. Obstructed right main stem bronchus and collapse of the right upper lobe and right middle lobe. Small right effusion. The appearance is similar to the patient's chest radiograph from 02/20/2018.  Patient subsequently had a PET CT on 03/10/2018 by Dr. Vaughan Browner which showed Large intensely hypermetabolic RIGHT hilar mass obstructs the RIGHT upper lobe bronchus and surrounds the bronchus intermedius. Post obstructive collapse of the RIGHT upper lobe.  No hypermetabolic mediastinal or supraclavicular lymph nodes. No evidence distant metastatic disease.  Patient had a flexible video fiberoptic bronchoscopy done on 03/21/2018 by Dr. Vaughan Browner which showed which showed some nodularity of the right mainstem bronchial mucosa with near complete occlusion of right main stem. Unable to traverse the occluded part of right main stem. The  mucosa was friable with propensity to bleed. Endobronchial biopsies and brushings were taken from the R mainstem and right upper lobe for pathology.  Preliminary results showed atypical cells however final cytology/pathology is currently pending.  Patient report rt sided chest pain with deep inspiration, anorexia and weight loss of about 30lbs in the last 6 months.  Interval History:   Barry Booker returns to clinic today for management and evaluation of his lung cancer. We are joined today by his wife, Barry Booker. The patient's last visit with [place] was on 05/19/2020. The pt reports that he is doing well overall.  The pt reports he feels about the same.  No acute new symptoms.  No new chest pain or shortness of breath.  Still smoking a few cigarettes a day.  He notes his leg swelling is improved. Not very active.  Feels like himself with no other acute issues at this time.  He is grateful for all the cares he is getting.  Of note since the patient's last visit, pt has had CT [phone]) completed on 08/19/2020 with results revealing "Radiation changes in the right hemithorax. No findings specific for recurrent or metastatic disease. 3 mm subpleural nodule in the posterior right lower lobe, likely benign. Small right pleural effusion, decreased. Aortic Atherosclerosis."  Lab results today (10/10/20) of CBC w/diff and CMP reviewed with the patient.   MEDICAL HISTORY:  Past Medical History:  Diagnosis Date  . Alcohol abuse   . COPD (chronic obstructive pulmonary disease) ([place])   . Diabetes mellitus without complication ([place])   . Echocardiogram    Echo 11/2018: EF 45-50, mild MAC, mild calc of AoV (difficult study) >> Limited echo with Definity contrast 11/2018: EF  50-55  . Gout   . High cholesterol   . Hypertension   . Malignant neoplasm of right upper lobe of lung ([place]) 03/29/2018    SURGICAL HISTORY: Past Surgical History:  Procedure Laterality Date  . COLONOSCOPY    . FLEXIBLE  BRONCHOSCOPY [name] 03/21/2018   Procedure: FLEXIBLE BRONCHOSCOPY;  Surgeon: Marshell Garfinkel, MD;  Location: [facility];  Service: Pulmonary;  Laterality: [name];  . NOSE SURGERY  90's  . VIDEO BRONCHOSCOPY WITH ENDOBRONCHIAL ULTRASOUND [name] 03/21/2018   Procedure: VIDEO BRONCHOSCOPY WITH ENDOBRONCHIAL ULTRASOUND;  Surgeon: Marshell Garfinkel, MD;  Location: [facility];  Service: Pulmonary;  Laterality: [name];    SOCIAL HISTORY: Social History   Socioeconomic History  . Marital status: Married    Spouse name: Not on file  . Number of children: Not on file  . Years of education: Not on file  . Highest education level: Not on file  Occupational History  . Not on file  Tobacco Use  . Smoking status: Former Smoker    Packs/day: 0.25    Years: 50.00    Pack years: 12.50    Types: Cigarettes    Quit date: 07/2017    Years since quitting: 3.2  . Smokeless tobacco: Never Used  . Tobacco comment: quit smoking 07/2017  Vaping Use  . Vaping Use: Never used  Substance and Sexual Activity  . Alcohol use: Yes    Alcohol/week: 14.0 - 21.0 standard drinks    Types: 14 - 21 Cans of beer per week  . Drug use: No  . Sexual activity: Not on file  Other Topics Concern  . Not on file  Social History Narrative  . Not on file   Social Determinants of Health   Financial Resource Strain: Not on file  Food Insecurity: Not on file  Transportation Needs: Not on file  Physical Activity: Not on file  Stress: Not on file  Social Connections: Not on file  Intimate Partner Violence: Not on file    FAMILY HISTORY: Family History  Problem Relation Age of Onset  . Arthritis Mother   . Cancer Father   . Cancer Sister   . Heart disease Maternal Uncle   . Colon cancer Neg Hx     ALLERGIES:  is allergic to neomycin-bacitracin zn-polymyx.  MEDICATIONS:  Current Outpatient Medications  Medication Sig Dispense Refill  . albuterol (VENTOLIN HFA) 108 (90 Base) MCG/ACT inhaler INHALE 1 TO 2 PUFFS INTO THE LUNGS EVERY 4  HOURS AS NEEDED FOR WHEEZING OR SHORTNESS OF BREATH 6.7 each 2  . ANORO ELLIPTA 62.5-25 MCG/INH AEPB INHALE 1 PUFF INTO THE LUNGS DAILY 60 each 1  . aspirin 81 MG tablet Take 81 mg by mouth daily.    [name] atorvastatin (LIPITOR) 40 MG tablet Take 40 mg by mouth daily.     . benazepril (LOTENSIN) 20 MG tablet Take 20 mg by mouth daily.     . diphenhydrAMINE (BENADRYL) 12.5 MG/5ML elixir Take 12.5 mg by mouth daily as needed for allergies.     . folic acid (FOLVITE) 654 MCG tablet Take 400 mcg by mouth daily.    . furosemide (LASIX) 40 MG tablet Take 40 mg by mouth daily.     [name] HYDROcodone-acetaminophen (NORCO) 5-325 MG tablet 1/2 to 1 tablet 4 times daily as needed for pain. 60 tablet 0  . ibuprofen (ADVIL,MOTRIN) 200 MG tablet Take 400 mg by mouth every 6 (six) hours as needed for headache, mild pain or moderate pain.    [name]  levothyroxine (SYNTHROID) 25 MCG tablet TAKE 1 TABLET BY MOUTH DAILY BEFORE BREAKFAST. (Patient taking differently: 75 mcg. ) 90 tablet 1  . metFORMIN (GLUCOPHAGE) 500 MG tablet Take 500 mg by mouth every morning.    . metoprolol succinate (TOPROL-XL) 25 MG 24 hr tablet TAKE 1 TABLET BY MOUTH DAILY. PLEASE MAKE OVERDUE APPT WITH DR. BEFORE ANYMORE REFILLS 90 tablet 0  . potassium chloride SA (K-DUR) 20 MEQ tablet Take 20 mEq by mouth daily.    . prochlorperazine (COMPAZINE) 10 MG tablet Take 10 mg by mouth every 6 (six) hours as needed for nausea or vomiting.     [name] UNABLE TO FIND Take by mouth.     No current facility-administered medications for this visit.    REVIEW OF SYSTEMS:   A 10+ POINT REVIEW OF SYSTEMS WAS OBTAINED including neurology, dermatology, psychiatry, cardiac, respiratory, lymph, extremities, GI, GU, Musculoskeletal, constitutional, breasts, reproductive, HEENT.  All pertinent positives are noted in the HPI.  All others are negative.   PHYSICAL EXAMINATION: There were no vitals filed for this visit. Wt Readings from Last 3 Encounters:  05/19/20 241 lb 1.6 oz  (109.4 kg)  01/11/20 244 lb 1.6 oz (110.7 kg)  11/30/19 243 lb (110.2 kg)   There is no height or weight on file to calculate BMI.    NAD GENERAL:alert, in no acute distress and comfortable SKIN: no acute rashes, no significant lesions EYES: conjunctiva are pink and non-injected, sclera anicteric OROPHARYNX: MMM, no exudates, no oropharyngeal erythema or ulceration NECK: supple, no JVD LYMPH:  no palpable lymphadenopathy in the cervical, axillary or inguinal regions LUNGS: clear to auscultation b/l with normal respiratory effort HEART: regular rate & rhythm ABDOMEN:  normoactive bowel sounds , non tender, not distended. No palpable hepatosplenomegaly.  Extremity: no pedal edema PSYCH: alert & oriented x 3 with fluent speech NEURO: no focal motor/sensory deficits  LABORATORY DATA:  I have reviewed the data as listed . CBC Latest Ref Rng & Units 05/19/2020 11/30/2019 08/05/2019  WBC 4.0 - 10.5 K/uL 8.9 10.9(H) 7.3  Hemoglobin 13.0 - 17.0 g/dL 12.9(L) 13.0 13.0  Hematocrit 39.0 - 52.0 % 41.0 41.5 40.1  Platelets 150 - 400 K/uL 153 172 146(L)    . CMP Latest Ref Rng & Units 05/19/2020 11/30/2019 08/05/2019  Glucose 70 - 99 mg/dL 143(H) 123(H) 153(H)  BUN 8 - 23 mg/dL 28(H) 17 19  Creatinine 0.61 - 1.24 mg/dL 1.28(H) 1.10 1.11  Sodium 135 - 145 mmol/L 141 140 139  Potassium 3.5 - 5.1 mmol/L 4.3 4.3 4.2  Chloride 98 - 111 mmol/L 104 104 102  CO2 22 - 32 mmol/L _0 Calcium 8.9 - 10.3 mg/dL 9.9 9.4 9.7  Total Protein 6.5 - 8.1 g/dL 7.8 8.0 7.7  Total Bilirubin 0.3 - 1.2 mg/dL 0.3 0.5 0.3  Alkaline Phos 38 - 126 U/L 66 79 62  AST 15 - 41 U/L 11(L) 12(L) 20  ALT 0 - 44 U/L _1 08/10/2019  08/10/2019 Cytology- PAP  03/25/18 Endobronchial bx:     RADIOGRAPHIC STUDIES: I have personally reviewed the radiological images as listed and agreed with the findings in the report.  CT chest 08/21/2020  IMPRESSION: Radiation changes in the right hemithorax.  No findings  specific for recurrent or metastatic disease. 3 mm subpleural nodule in the posterior right lower lobe, likely benign. Attention on follow-up is suggested.  Small right pleural effusion, decreased.  Aortic Atherosclerosis (ICD10-I70.0).   Electronically  Signed   By: Julian Hy M.D.   On: 08/21/2020 09:00  ASSESSMENT & PLAN:   74 y.o.  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  03/23/18 Brain MRI did not reveal any evidence of intracranial metastases 03/10/18 PET/CT revealed large intensely hypermetabolic Right hilar mass obstructs the Right upper lobe bronchus and surrounds the bronchus intermedus. Post obstructive collapse of the Right upper lobe. No hypermetabolic mediastinal or supraclavicular lymph nodes. No evidence of distant mets.  06/23/18 PET/CT revealed  Marked reduction in activity associated with the dominant right upper lobe central mass, maximum SUV 3.4 and previously 15.3. Please note that this lesion does still in case and markedly narrows the right upper lobe bronchus. 2. There is some patchy ground-glass opacities with small airspace opacity components in the right upper lobe, right middle lobe, right lower lobe, and lingula. Some of these are hypermetabolic, for example lingular opacity has a maximum SUV of 6.2 and the right lower lobe bandlike opacity 5.7. These are probably inflammatory and merit surveillance. 3. Small to moderate right pleural effusion with accentuated metabolic activity. Malignant effusion not excluded. 4. No findings of metastatic disease to the neck, abdomen/pelvis, or skeleton. 5. Aortic aneurysm NOS. Infrarenal aortic aneurysm 3.8 cm in diameter. Recommend followup by [place] in 2 years. 6. Other imaging findings of potential clinical significance: Chronic paranasal sinusitis.  Aortic Atherosclerosis. Coronary atherosclerosis. Hepatic cirrhosis. Ventral hernia containing a loop of small bowel. Suspected late phase healing of the right anterior eighth rib.   09/18/18 CT Chest revealed Rarely relatively stable perihilar consolidation on the RIGHT consistent post treatment change. Persistent narrowing of the proximal bronchi. 2. Interval increase in interstitial thickening in the RIGHT upper lobe. Differential include postobstructive process versus lymphangitic carcinoma. Favor postobstructive process. 3. Interval increase in RIGHT pleural effusion. 4. With potential progressive malignant findings versus progressive benign change, consider follow-up FDG PET scan.   10/02/18 Cytology from thoracentesis showed no malignant cells. 10/02/18 CXR revealed No evidence of pneumothorax after RIGHT thoracentesis. 2. Persistent large, loculated RIGHT pleural effusion and associated dense passive atelectasis in the RIGHT LOWER LOBE. 3. No new abnormalities.  10/29/18 CT Chest revealed Interval decrease in right pleural effusion with residual loculated component seen anteriorly and medially. 2. The interstitial and airspace disease in the posterior right upper lung is similar to prior. 3. Stable volume loss right hemithorax with right parahilar interstitial and airspace disease likely reflecting radiation fibrosis. 4. Nodular liver morphology suggests cirrhosis.  S/p 5 cycles of Durvalumab  03/06/19 CT Chest revealed "Stable appearing extensive radiation changes and treated tumor in the right upper lobe and right paramediastinal lung and right hilum. I do not see any definite CT findings for progressive disease. 2. Stable loculated right pleural fluid collections. 3. No CT findings to suggest metastatic pulmonary nodules in the left lung or metastatic disease involving the upper abdomen. 4. Stable cirrhotic changes involving the liver. Aortic Atherosclerosis and Emphysema."  11/24/18 ECHO revealed LV  EF of 50-55%  12/31/19 CT C/A/P  ([id number]) ([id number]) revealed "1. Stable appearance of treated tumor within the right perihilar and perihilar right lung. No specific findings identified to suggest local tumor recurrence. 2. Decrease in size of loculated right pleural effusion. 3. Cirrhosis. 4. Infrarenal abdominal aortic aneurysm."  2) RUL airway obstruction with RUL lung collapse.  3) Concern for severe narrowing of  the right main pulmonary artery as well as right upper lobe and right middle lobe arteries.   4.  Hypothyroidism - likely related to Durvalumab -On levothyroxine   5.  Patient Active Problem List   Diagnosis Date Noted  . Squamous cell lung cancer, right ([place]) 04/04/2018  . Counseling regarding advance care planning and goals of care 04/04/2018  . Malignant neoplasm of right upper lobe of lung ([place]) 03/29/2018  . Anorexia nervosa   . Airway obstruction, anatomic   . Loss of weight   . Respiratory distress 03/21/2018  . Lung mass 03/14/2018  . Chronic diastolic CHF (congestive heart failure) ([place]) 02/09/2016  . History of pulmonary embolism 02/09/2016  . Need for hepatitis C screening test 01/11/2016  . Dyspnea on exertion 12/19/2015  . Fecal occult blood test positive 07/05/2015  . Local edema 06/28/2015  . Type 2 diabetes mellitus ([place]) 01/07/2015  . Excessive urination at night 06/22/2014  . Encounter for screening for cardiovascular disorders 01/29/2014  . Adiposity 01/19/2014  . Chronic obstructive pulmonary disease ([place]) 07/07/2013  . Hypertensive heart disease 06/22/2013  . Disease of skin and subcutaneous tissue 12/25/2012  . Disease of nail 11/25/2012  . Encounter for screening for eye and ear disorders 07/29/2012  . Abnormal blood chemistry 12/10/2011  . 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 Nondependent alcohol abuse 06/05/2011  . Chronic nonalcoholic liver disease 49/44/9675  . Thrombocytopenia ([place]) 07/12/2010  . ABNORMAL ABDOMINAL  ULTRASOUND 09/14/2008  . Abnormal abdominal ultrasound 09/14/2008  . HLD (hyperlipidemia) 09/08/2007  . Hyperlipidemia associated with type 2 diabetes mellitus ([place]) 09/08/2007  . ALCOHOL ABUSE 02/25/2007  . TOBACCO ABUSE 02/25/2007  . INSOMNIA 02/25/2007  . DEPENDENCE, ALCOHOL NEC/NOS, IN REMISSION 02/19/2007  . HYPERTENSION 02/19/2007  . COPD 02/19/2007    PLAN:  -Discussed his CT chest abdomen pelvis results from 11/21 showing no overt evidence of lung cancer progression at this time. -No radiographic or clinical evidence of Squamous Cell Lung cancer progression at this time. Will continue watchful observation. -Continue 2000 IU Vitamin D daily  -Counseled again on smoking cessation. -Continue close follow-up with primary care physician for continued management of other medical issues including hypothyroidism, hypertension and diabetes CHF COPD etc..   FOLLOW UP: RTC with Dr Irene Limbo with labs in 6 months  The total time spent in the appt was 20 minutes and more than 50% was on counseling and direct patient cares.  All of the patient's questions were answered with apparent satisfaction. The patient knows to call the clinic with any problems, questions or concerns.   Sullivan Lone MD [facility] [place] [facility] AAHIVMS [facility] Hematology/Oncology Physician [facility]  (Office):       [phone] (Work cell):  [phone] (Fax):           [phone]   I, Yevette Edwards, am acting as a scribe for Dr. Sullivan Lone.   .I have reviewed the above documentation for accuracy and completeness, and I agree with the above. Brunetta Genera MD

## 2020-10-19 ENCOUNTER — Other Ambulatory Visit: Payer: Self-pay | Admitting: Hematology

## 2020-10-19 DIAGNOSIS — E039 Hypothyroidism, unspecified: Secondary | ICD-10-CM

## 2020-11-16 ENCOUNTER — Other Ambulatory Visit: Payer: Self-pay | Admitting: *Deleted

## 2020-11-16 DIAGNOSIS — C3490 Malignant neoplasm of unspecified part of unspecified bronchus or lung: Secondary | ICD-10-CM

## 2020-11-16 MED ORDER — HYDROCODONE-ACETAMINOPHEN 5-325 MG PO TABS: ORAL_TABLET | ORAL | 0 refills | Status: DC

## 2020-11-16 NOTE — Telephone Encounter (Signed)
Patients wife called. He needs a refill of pain medication. Refill request routed to Dr. Irene Limbo

## 2020-11-18 ENCOUNTER — Ambulatory Visit (INDEPENDENT_AMBULATORY_CARE_PROVIDER_SITE_OTHER): Payer: Medicare HMO | Admitting: Podiatry

## 2020-11-18 ENCOUNTER — Encounter: Payer: Self-pay | Admitting: Podiatry

## 2020-11-18 ENCOUNTER — Other Ambulatory Visit: Payer: Self-pay

## 2020-11-18 DIAGNOSIS — E119 Type 2 diabetes mellitus without complications: Secondary | ICD-10-CM | POA: Diagnosis not present

## 2020-11-18 DIAGNOSIS — M79676 Pain in unspecified toe(s): Secondary | ICD-10-CM | POA: Diagnosis not present

## 2020-11-18 DIAGNOSIS — B351 Tinea unguium: Secondary | ICD-10-CM

## 2020-11-18 NOTE — Progress Notes (Signed)
This patient returns to my office for at risk foot care.  This patient requires this care by a professional since this patient will be at risk due to having type 2 diabetes. He presents to the office with his wife.  This patient is unable to cut nails himself since the patient cannot reach his nails.These nails are painful walking and wearing shoes.  This patient presents for at risk foot care today.  General Appearance  Alert, conversant and in no acute stress.  Vascular  Dorsalis pedis and posterior tibial  pulses are palpable  bilaterally.  Capillary return is within normal limits  bilaterally. Temperature is within normal limits  bilaterally.  Neurologic  Senn-Weinstein monofilament wire test within normal limits  bilaterally. Muscle power within normal limits bilaterally.  Nails Thick disfigured discolored nails with subungual debris  from hallux to fifth toes bilaterally. No evidence of bacterial infection or drainage bilaterally.  Orthopedic  No limitations of motion  feet .  No crepitus or effusions noted.  No bony pathology or digital deformities noted.  Skin  normotropic skin with no porokeratosis noted bilaterally.  No signs of infections or ulcers noted.     Onychomycosis  Pain in right toes  Pain in left toes  Consent was obtained for treatment procedures.   Mechanical debridement of nails 1-5  bilaterally performed with a nail nipper.  Filed with dremel without incident.    Return office visit    3 months                Told patient to return for periodic foot care and evaluation due to potential at risk complications.   [place] Barefoot DPM

## 2020-12-16 ENCOUNTER — Telehealth: Payer: Self-pay

## 2020-12-16 ENCOUNTER — Other Ambulatory Visit: Payer: Self-pay | Admitting: Hematology

## 2020-12-16 DIAGNOSIS — C3490 Malignant neoplasm of unspecified part of unspecified bronchus or lung: Secondary | ICD-10-CM

## 2020-12-16 MED ORDER — HYDROCODONE-ACETAMINOPHEN 5-325 MG PO TABS: ORAL_TABLET | ORAL | 0 refills | Status: DC

## 2020-12-16 NOTE — Telephone Encounter (Signed)
Received message to refill pt's. Norco medication. MD refilled and spouse M[place] Hijazi notified.

## 2020-12-17 ENCOUNTER — Other Ambulatory Visit: Payer: Self-pay | Admitting: Physician Assistant

## 2021-01-23 ENCOUNTER — Other Ambulatory Visit: Payer: Self-pay

## 2021-01-23 DIAGNOSIS — C3490 Malignant neoplasm of unspecified part of unspecified bronchus or lung: Secondary | ICD-10-CM

## 2021-01-23 MED ORDER — ALBUTEROL SULFATE HFA 108 (90 BASE) MCG/ACT IN AERS: INHALATION_SPRAY | RESPIRATORY_TRACT | 2 refills | Status: DC

## 2021-01-24 ENCOUNTER — Telehealth: Payer: Self-pay | Admitting: *Deleted

## 2021-01-24 NOTE — Telephone Encounter (Signed)
Wife called for a refill of hydrocodone

## 2021-01-24 NOTE — Telephone Encounter (Signed)
Wife called for refill of Hydrocodone

## 2021-01-25 MED ORDER — HYDROCODONE-ACETAMINOPHEN 5-325 MG PO TABS: ORAL_TABLET | ORAL | 0 refills | Status: DC

## 2021-02-17 ENCOUNTER — Ambulatory Visit: Payer: Medicare HMO | Admitting: Podiatry

## 2021-02-24 ENCOUNTER — Other Ambulatory Visit: Payer: Self-pay

## 2021-02-24 DIAGNOSIS — C3490 Malignant neoplasm of unspecified part of unspecified bronchus or lung: Secondary | ICD-10-CM

## 2021-02-28 MED ORDER — HYDROCODONE-ACETAMINOPHEN 5-325 MG PO TABS: ORAL_TABLET | ORAL | 0 refills | Status: DC

## 2021-03-13 ENCOUNTER — Ambulatory Visit: Payer: Medicare HMO | Admitting: Podiatry

## 2021-03-31 ENCOUNTER — Other Ambulatory Visit: Payer: Self-pay

## 2021-03-31 ENCOUNTER — Other Ambulatory Visit: Payer: Self-pay | Admitting: Hematology

## 2021-03-31 DIAGNOSIS — C3490 Malignant neoplasm of unspecified part of unspecified bronchus or lung: Secondary | ICD-10-CM

## 2021-03-31 MED ORDER — HYDROCODONE-ACETAMINOPHEN 5-325 MG PO TABS: ORAL_TABLET | ORAL | 0 refills | Status: DC

## 2021-04-10 ENCOUNTER — Telehealth: Payer: Self-pay | Admitting: Hematology

## 2021-04-10 NOTE — Telephone Encounter (Signed)
Scheduled appointment per 07/11 sch msg. Patient is aware.

## 2021-04-11 ENCOUNTER — Other Ambulatory Visit: Payer: Medicare HMO

## 2021-04-11 ENCOUNTER — Inpatient Hospital Stay: Payer: Medicare HMO

## 2021-04-11 ENCOUNTER — Ambulatory Visit: Payer: Medicare HMO | Admitting: Hematology

## 2021-04-11 ENCOUNTER — Inpatient Hospital Stay: Payer: Medicare HMO | Admitting: Hematology

## 2021-04-24 ENCOUNTER — Inpatient Hospital Stay: Payer: Medicare HMO

## 2021-04-24 ENCOUNTER — Inpatient Hospital Stay: Payer: Medicare HMO | Admitting: Hematology

## 2021-05-12 ENCOUNTER — Telehealth: Payer: Self-pay

## 2021-05-12 DIAGNOSIS — C3490 Malignant neoplasm of unspecified part of unspecified bronchus or lung: Secondary | ICD-10-CM

## 2021-05-12 NOTE — Telephone Encounter (Signed)
Pts wife LM requesting a refill of Norco.  I spoke with pts wife to acknowledge her message. She advised he has enough to carry him through the weekend.

## 2021-05-16 MED ORDER — HYDROCODONE-ACETAMINOPHEN 5-325 MG PO TABS: ORAL_TABLET | ORAL | 0 refills | Status: DC

## 2021-05-16 NOTE — Telephone Encounter (Signed)
Sent refill as requested.

## 2021-05-16 NOTE — Addendum Note (Signed)
Addended by: Dede Query T on: 05/16/2021 09:46 AM   Modules accepted: Orders

## 2021-05-22 ENCOUNTER — Inpatient Hospital Stay: Payer: Medicare HMO

## 2021-05-22 ENCOUNTER — Inpatient Hospital Stay: Payer: Medicare HMO | Admitting: Hematology

## 2021-05-26 ENCOUNTER — Inpatient Hospital Stay (HOSPITAL_BASED_OUTPATIENT_CLINIC_OR_DEPARTMENT_OTHER): Payer: Medicare HMO | Admitting: Hematology

## 2021-05-26 ENCOUNTER — Other Ambulatory Visit: Payer: Self-pay

## 2021-05-26 ENCOUNTER — Inpatient Hospital Stay: Payer: Medicare HMO | Attending: Hematology

## 2021-05-26 VITALS — BP 95/64 | HR 102 | Temp 99.6°F | Resp 18 | Wt 225.4 lb

## 2021-05-26 DIAGNOSIS — C3491 Malignant neoplasm of unspecified part of right bronchus or lung: Secondary | ICD-10-CM | POA: Insufficient documentation

## 2021-05-26 DIAGNOSIS — E039 Hypothyroidism, unspecified: Secondary | ICD-10-CM | POA: Insufficient documentation

## 2021-05-26 DIAGNOSIS — C3490 Malignant neoplasm of unspecified part of unspecified bronchus or lung: Secondary | ICD-10-CM

## 2021-05-26 LAB — CBC WITH DIFFERENTIAL/PLATELET
Abs Immature Granulocytes: 0.04 10*3/uL (ref 0.00–0.07)
Basophils Absolute: 0 10*3/uL (ref 0.0–0.1)
Basophils Relative: 0 %
Eosinophils Absolute: 0.1 10*3/uL (ref 0.0–0.5)
Eosinophils Relative: 1 %
HCT: 44.2 % (ref 39.0–52.0)
Hemoglobin: 14.3 g/dL (ref 13.0–17.0)
Immature Granulocytes: 0 %
Lymphocytes Relative: 7 %
Lymphs Abs: 0.7 10*3/uL (ref 0.7–4.0)
MCH: 28.7 pg (ref 26.0–34.0)
MCHC: 32.4 g/dL (ref 30.0–36.0)
MCV: 88.8 fL (ref 80.0–100.0)
Monocytes Absolute: 0.7 10*3/uL (ref 0.1–1.0)
Monocytes Relative: 7 %
Neutro Abs: 8.4 10*3/uL — ABNORMAL HIGH (ref 1.7–7.7)
Neutrophils Relative %: 85 %
Platelets: 150 10*3/uL (ref 150–400)
RBC: 4.98 MIL/uL (ref 4.22–5.81)
RDW: 15.1 % (ref 11.5–15.5)
WBC: 10 10*3/uL (ref 4.0–10.5)
nRBC: 0 % (ref 0.0–0.2)

## 2021-05-26 LAB — CMP (CANCER CENTER ONLY)
ALT: 13 U/L (ref 0–44)
AST: 14 U/L — ABNORMAL LOW (ref 15–41)
Albumin: 3.5 g/dL (ref 3.5–5.0)
Alkaline Phosphatase: 80 U/L (ref 38–126)
Anion gap: 10 (ref 5–15)
BUN: 19 mg/dL (ref 8–23)
CO2: 26 mmol/L (ref 22–32)
Calcium: 9.4 mg/dL (ref 8.9–10.3)
Chloride: 105 mmol/L (ref 98–111)
Creatinine: 1.23 mg/dL (ref 0.61–1.24)
GFR, Estimated: >60 mL/min (ref >60)
Glucose, Bld: 182 mg/dL — ABNORMAL HIGH (ref 70–99)
Potassium: 4.1 mmol/L (ref 3.5–5.1)
Sodium: 141 mmol/L (ref 135–145)
Total Bilirubin: 0.5 mg/dL (ref 0.3–1.2)
Total Protein: 7.9 g/dL (ref 6.5–8.1)

## 2021-05-26 LAB — TSH: TSH: 2.651 u[IU]/mL (ref 0.320–4.118)

## 2021-05-26 LAB — T4, FREE: Free T4: 0.78 ng/dL (ref 0.61–1.12)

## 2021-05-26 MED ORDER — ALBUTEROL SULFATE HFA 108 (90 BASE) MCG/ACT IN AERS: INHALATION_SPRAY | RESPIRATORY_TRACT | 2 refills | Status: DC

## 2021-05-29 ENCOUNTER — Telehealth: Payer: Self-pay | Admitting: Hematology

## 2021-05-29 NOTE — Telephone Encounter (Signed)
Left message with follow-up appointment per 8/26 los.

## 2021-06-01 NOTE — Progress Notes (Signed)
HEMATOLOGY/ONCOLOGY CLINIC NOTE  Date of Service: .05/26/2021   Patient Care Team: Bernerd Limbo, MD as PCP - General (Family Medicine) Sherren Mocha, MD as PCP - Cardiology (Cardiology)  CHIEF COMPLAINTS/PURPOSE OF CONSULTATION:  F/u for mx of lung cancer  HISTORY OF PRESENTING ILLNESS:   Barry Booker is a wonderful 75 y.o. male who has been referred to [place] by Dr Marshell Garfinkel for evaluation and management of presumed newly diagnosed rt sided lung cancer with concern for bronchial obstruction.  Patient has a history of hypertension, dyslipidemia, diabetes, COPD, alcohol abuse and previously had a chest x-ray on 02/20/2018 at [facility] which showed Right upper lobe consolidation/atelectasis may relate to post obstructive pneumonitis/pneumonia. An underlying obstructing lesion should be considered. Consider CT for further assessment.  H subsequently then had a CT of the chest on 02/27/2018 which showed -5.8 x 4.9 cm mass in the right mediastinum and hilum with associated severe narrowing of the right main pulmonary artery as well as right upper lobe and right middle lobe arteries. Obstructed right main stem bronchus and collapse of the right upper lobe and right middle lobe. Small right effusion. The appearance is similar to the patient's chest radiograph from 02/20/2018.  Patient subsequently had a PET CT on 03/10/2018 by Dr. Vaughan Browner which showed Large intensely hypermetabolic RIGHT hilar mass obstructs the RIGHT upper lobe bronchus and surrounds the bronchus intermedius. Post obstructive collapse of the RIGHT upper lobe.  No hypermetabolic mediastinal or supraclavicular lymph nodes. No evidence distant metastatic disease.  Patient had a flexible video fiberoptic bronchoscopy done on 03/21/2018 by Dr. Vaughan Browner which showed which showed some nodularity of the right mainstem bronchial mucosa with near complete occlusion of right main stem. Unable to traverse the occluded part of right main stem.  The mucosa was friable with propensity to bleed. Endobronchial biopsies and brushings were taken from the R mainstem and right upper lobe for pathology.  Preliminary results showed atypical cells however final cytology/pathology is currently pending.  Patient report rt sided chest pain with deep inspiration, anorexia and weight loss of about 30lbs in the last 6 months.  Interval History:   Barry Booker returns to clinic today for management and evaluation of his lung cancer. We are joined today by his wife, Barry Booker. The patient's last visit with [place] was about 6 to 7 months ago.   The pt reports he feels about the same.  No acute new symptoms.  No new chest pain or shortness of breath no hemoptysis.  Still smoking a few cigarettes a day.   He notes he has gained more weight on account of not getting much physical activity. Tends to maintain a very sedentary lifestyle.  Lab results today (.05/26/2021) of CBC w/diff and CMP reviewed with the patient.   MEDICAL HISTORY:  Past Medical History:  Diagnosis Date   Alcohol abuse    COPD (chronic obstructive pulmonary disease) ([place])    Diabetes mellitus without complication (HCC)    Echocardiogram    Echo 11/2018: EF 45-50, mild MAC, mild calc of AoV (difficult study) >> Limited echo with Definity contrast 11/2018: EF 50-55   Gout    High cholesterol    Hypertension    Malignant neoplasm of right upper lobe of lung ([place]) 03/29/2018    SURGICAL HISTORY: Past Surgical History:  Procedure Laterality Date   COLONOSCOPY     FLEXIBLE BRONCHOSCOPY [name] 03/21/2018   Procedure: FLEXIBLE BRONCHOSCOPY;  Surgeon: Marshell Garfinkel, MD;  Location: [place];  Service: Pulmonary;  Laterality: [name];   NOSE SURGERY  90's   VIDEO BRONCHOSCOPY WITH ENDOBRONCHIAL ULTRASOUND [name] 03/21/2018   Procedure: VIDEO BRONCHOSCOPY WITH ENDOBRONCHIAL ULTRASOUND;  Surgeon: Marshell Garfinkel, MD;  [facility];  Service: Pulmonary;  Laterality: [name];    SOCIAL HISTORY: Social  History   Socioeconomic History   Marital status: Married    Spouse name: Not on file   Number of children: Not on file   Years of education: Not on file   Highest education level: Not on file  Occupational History   Not on file  Tobacco Use   Smoking status: Former    Packs/day: 0.25    Years: 50.00    Pack years: 12.50    Types: Cigarettes    Quit date: 07/2017    Years since quitting: 3.9   Smokeless tobacco: Never   Tobacco comments:    quit smoking 07/2017  Vaping Use   Vaping Use: Never used  Substance and Sexual Activity   Alcohol use: Yes    Alcohol/week: 14.0 - 21.0 standard drinks    Types: 14 - 21 Cans of beer per week   Drug use: No   Sexual activity: Not on file  Other Topics Concern   Not on file  Social History Narrative   Not on file   Social Determinants of Health   Financial Resource Strain: Not on file  Food Insecurity: Not on file  Transportation Needs: Not on file  Physical Activity: Not on file  Stress: Not on file  Social Connections: Not on file  Intimate Partner Violence: Not on file    FAMILY HISTORY: Family History  Problem Relation Age of Onset   Arthritis Mother    Cancer Father    Cancer Sister    Heart disease Maternal Uncle    Colon cancer Neg Hx     ALLERGIES:  is allergic to neomycin-bacitracin zn-polymyx.  MEDICATIONS:  Current Outpatient Medications  Medication Sig Dispense Refill   albuterol (VENTOLIN HFA) 108 (90 Base) MCG/ACT inhaler INHALE 1 TO 2 PUFFS INTO THE LUNGS EVERY 4 HOURS AS NEEDED FOR WHEEZING OR SHORTNESS OF BREATH 6.7 each 2   ANORO ELLIPTA 62.5-25 MCG/INH AEPB INHALE 1 PUFF INTO THE LUNGS DAILY 60 each 1   aspirin 81 MG tablet Take 81 mg by mouth daily.     atorvastatin (LIPITOR) 40 MG tablet Take 40 mg by mouth daily.      benazepril (LOTENSIN) 20 MG tablet Take 20 mg by mouth daily.      diphenhydrAMINE (BENADRYL) 12.5 MG/5ML elixir Take 12.5 mg by mouth daily as needed for allergies.      folic  acid (FOLVITE) 762 MCG tablet Take 400 mcg by mouth daily.     furosemide (LASIX) 40 MG tablet Take 40 mg by mouth daily.      HYDROcodone-acetaminophen (NORCO) 5-325 MG tablet 1/2 to 1 tablet 4 times daily as needed for pain. 60 tablet 0   ibuprofen (ADVIL,MOTRIN) 200 MG tablet Take 400 mg by mouth every 6 (six) hours as needed for headache, mild pain or moderate pain.     levothyroxine (SYNTHROID) 25 MCG tablet TAKE 1 TABLET BY MOUTH DAILY BEFORE BREAKFAST. (Patient taking differently: 75 mcg.) 90 tablet 1   metFORMIN (GLUCOPHAGE) 500 MG tablet Take 500 mg by mouth every morning.     metoprolol succinate (TOPROL-XL) 25 MG 24 hr tablet TAKE 1 TABLET BY MOUTH DAILY. PLEASE MAKE OVERDUE APPT WITH DR. BEFORE ANYMORE REFILLS  30 tablet 0   potassium chloride SA (K-DUR) 20 MEQ tablet Take 20 mEq by mouth daily.     prochlorperazine (COMPAZINE) 10 MG tablet Take 10 mg by mouth every 6 (six) hours as needed for nausea or vomiting.      UNABLE TO FIND Take by mouth.     No current facility-administered medications for this visit.    REVIEW OF SYSTEMS:   .10 Point review of Systems was done is negative except as noted above.  PHYSICAL EXAMINATION: Vitals:   05/26/21 1141  BP: 95/64  Pulse: (!) 102  Resp: 18  Temp: 99.6 F (37.6 C)  SpO2: 98%   Wt Readings from Last 3 Encounters:  05/26/21 225 lb 6.4 oz (102.2 kg)  10/10/20 241 lb 1.6 oz (109.4 kg)  05/19/20 241 lb 1.6 oz (109.4 kg)   Body mass index is 32.34 kg/m.   [name] GENERAL:alert, in no acute distress and comfortable SKIN: no acute rashes, no significant lesions EYES: conjunctiva are pink and non-injected, sclera anicteric OROPHARYNX: MMM, no exudates, no oropharyngeal erythema or ulceration NECK: supple, no JVD LYMPH:  no palpable lymphadenopathy in the cervical, axillary or inguinal regions LUNGS: clear to auscultation b/l with normal respiratory effort HEART: regular rate & rhythm ABDOMEN:  normoactive bowel sounds , non  tender, not distended. Extremity: no pedal edema PSYCH: alert & oriented x 3 with fluent speech NEURO: no focal motor/sensory deficits   LABORATORY DATA:  I have reviewed the data as listed . CBC Latest Ref Rng & Units 05/26/2021 05/19/2020 11/30/2019  WBC 4.0 - 10.5 K/uL 10.0 8.9 10.9(H)  Hemoglobin 13.0 - 17.0 g/dL 14.3 12.9(L) 13.0  Hematocrit 39.0 - 52.0 % 44.2 41.0 41.5  Platelets 150 - 400 K/uL 150 153 172    . CMP Latest Ref Rng & Units 05/26/2021 05/19/2020 11/30/2019  Glucose 70 - 99 mg/dL 182(H) 143(H) 123(H)  BUN 8 - 23 mg/dL 19 28(H) 17  Creatinine 0.61 - 1.24 mg/dL 1.23 1.28(H) 1.10  Sodium 135 - 145 mmol/L 141 141 140  Potassium 3.5 - 5.1 mmol/L 4.1 4.3 4.3  Chloride 98 - 111 mmol/L 105 104 104  CO2 22 - 32 mmol/L _0 Calcium 8.9 - 10.3 mg/dL 9.4 9.9 9.4  Total Protein 6.5 - 8.1 g/dL 7.9 7.8 8.0  Total Bilirubin 0.3 - 1.2 mg/dL 0.5 0.3 0.5  Alkaline Phos 38 - 126 U/L 80 66 79  AST 15 - 41 U/L 14(L) 11(L) 12(L)  ALT 0 - 44 U/L _1 08/10/2019  08/10/2019 Cytology- PAP  03/25/18 Endobronchial bx:     RADIOGRAPHIC STUDIES: I have personally reviewed the radiological images as listed and agreed with the findings in the report.  CT chest 08/21/2020  IMPRESSION: Radiation changes in the right hemithorax.   No findings specific for recurrent or metastatic disease. 3 mm subpleural nodule in the posterior right lower lobe, likely benign. Attention on follow-up is suggested.   Small right pleural effusion, decreased.   Aortic Atherosclerosis (ICD10-I70.0).     Electronically Signed   By: Julian Hy M.D.   On: 08/21/2020 09:00  ASSESSMENT & PLAN:   75 y.o.  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  03/23/18 Brain MRI did not reveal any evidence of intracranial  metastases 03/10/18 PET/CT revealed large intensely hypermetabolic Right  hilar mass obstructs the Right upper lobe bronchus and surrounds the bronchus intermedus. Post obstructive collapse of the Right upper lobe. No hypermetabolic mediastinal or supraclavicular lymph nodes. No evidence of distant mets.  06/23/18 PET/CT revealed  Marked reduction in activity associated with the dominant right upper lobe central mass, maximum SUV 3.4 and previously 15.3. Please note that this lesion does still in case and markedly narrows the right upper lobe bronchus. 2. There is some patchy ground-glass opacities with small airspace opacity components in the right upper lobe, right middle lobe, right lower lobe, and lingula. Some of these are hypermetabolic, for example lingular opacity has a maximum SUV of 6.2 and the right lower lobe bandlike opacity 5.7. These are probably inflammatory and merit surveillance. 3. Small to moderate right pleural effusion with accentuated metabolic activity. Malignant effusion not excluded. 4. No findings of metastatic disease to the neck, abdomen/pelvis, or skeleton. 5. Aortic aneurysm NOS. Infrarenal aortic aneurysm 3.8 cm in diameter. Recommend followup by [place] in 2 years. 6. Other imaging findings of potential clinical significance: Chronic paranasal sinusitis. Aortic Atherosclerosis. Coronary atherosclerosis. Hepatic cirrhosis. Ventral hernia containing a loop of small bowel. Suspected late phase healing of the right anterior eighth rib.   09/18/18 CT Chest revealed Rarely relatively stable perihilar consolidation on the RIGHT consistent post treatment change. Persistent narrowing of the proximal bronchi. 2. Interval increase in interstitial thickening in the RIGHT upper lobe. Differential include postobstructive process versus lymphangitic carcinoma. Favor postobstructive process. 3. Interval increase in RIGHT pleural effusion. 4. With potential progressive malignant findings versus  progressive benign change, consider follow-up FDG PET scan.   10/02/18 Cytology from thoracentesis showed no malignant cells. 10/02/18 CXR revealed No evidence of pneumothorax after RIGHT thoracentesis. 2. Persistent large, loculated RIGHT pleural effusion and associated dense passive atelectasis in the RIGHT LOWER LOBE. 3. No new abnormalities.  10/29/18 CT Chest revealed Interval decrease in right pleural effusion with residual loculated component seen anteriorly and medially. 2. The interstitial and airspace disease in the posterior right upper lung is similar to prior. 3. Stable volume loss right hemithorax with right parahilar interstitial and airspace disease likely reflecting radiation fibrosis. 4. Nodular liver morphology suggests cirrhosis.  S/p 5 cycles of Durvalumab  03/06/19 CT Chest revealed "Stable appearing extensive radiation changes and treated tumor in the right upper lobe and right paramediastinal lung and right hilum. I do not see any definite CT findings for progressive disease. 2. Stable loculated right pleural fluid collections. 3. No CT findings to suggest metastatic pulmonary nodules in the left lung or metastatic disease involving the upper abdomen. 4. Stable cirrhotic changes involving the liver. Aortic Atherosclerosis and Emphysema."  11/24/18 ECHO revealed LV EF of 50-55%  12/31/19 CT C/A/P  ([id number]) ([id number]) revealed "1. Stable appearance of treated tumor within the right perihilar and perihilar right lung. No specific findings identified to suggest local tumor recurrence. 2. Decrease in size of loculated right pleural effusion. 3. Cirrhosis. 4. Infrarenal abdominal aortic aneurysm."  2) h/o RUL airway obstruction with RUL lung collapse.  3) previous Concern for severe narrowing of the right main pulmonary artery as well as right upper lobe and right middle lobe arteries.   4.  Hypothyroidism - likely related to Durvalumab -On levothyroxine   5.  Patient Active  Problem List   Diagnosis Date Noted   Squamous cell lung cancer, right ([place]) 04/04/2018   Counseling regarding advance care planning and goals of care 04/04/2018   Malignant neoplasm of right upper lobe  of lung ([place]) 03/29/2018   Anorexia nervosa    Airway obstruction, anatomic    Loss of weight    Respiratory distress 03/21/2018   Lung mass 03/14/2018   Chronic diastolic CHF (congestive heart failure) ([place]) 02/09/2016   History of pulmonary embolism 02/09/2016   Need for hepatitis C screening test 01/11/2016   Dyspnea on exertion 12/19/2015   Fecal occult blood test positive 07/05/2015   Local edema 06/28/2015   Type 2 diabetes mellitus ([place]) 01/07/2015   Excessive urination at night 06/22/2014   Encounter for screening for cardiovascular disorders 01/29/2014   Adiposity 01/19/2014   Chronic obstructive pulmonary disease ([place]) 07/07/2013   Hypertensive heart disease 06/22/2013   Disease of skin and subcutaneous tissue 12/25/2012   Disease of nail 11/25/2012   Encounter for screening for eye and ear disorders 07/29/2012   Abnormal blood chemistry 12/10/2011   Healed or old pulmonary embolism 07/06/2011   Nondependent alcohol abuse 06/05/2011   Chronic nonalcoholic liver disease 59/47/0761   Thrombocytopenia ([place]) 07/12/2010   ABNORMAL ABDOMINAL ULTRASOUND 09/14/2008   Abnormal abdominal ultrasound 09/14/2008   HLD (hyperlipidemia) 09/08/2007   Hyperlipidemia associated with type 2 diabetes mellitus ([place]) 09/08/2007   ALCOHOL ABUSE 02/25/2007   TOBACCO ABUSE 02/25/2007   INSOMNIA 02/25/2007   DEPENDENCE, ALCOHOL NEC/NOS, IN REMISSION 02/19/2007   HYPERTENSION 02/19/2007   COPD 02/19/2007    PLAN:  -Patient's labs are stable and were reviewed with him  -He has no new significant clinical symptoms suggestive of lung cancer progression at this time. -Continue 2000 IU Vitamin D daily  -Counseled again on smoking cessation. -Continue close follow-up with primary care  physician for continued management of other medical issues including hypothyroidism, hypertension and diabetes CHF COPD etc..  FOLLOW UP: CT chest in 23 weeks RTC with Dr Irene Limbo with labs in 24 weeks  . The total time spent in the appointment was 20 minutes and more than 50% was on counseling and direct patient cares.   All of the patient's questions were answered with apparent satisfaction. The patient knows to call the clinic with any problems, questions or concerns.   Sullivan Lone MD [place] AAHIVMS [facility] Hematology/Oncology Physician [facility]  (Office):       [phone] (Work cell):  [phone] (Fax):           [phone]

## 2021-06-23 ENCOUNTER — Other Ambulatory Visit: Payer: Self-pay

## 2021-06-23 DIAGNOSIS — C3490 Malignant neoplasm of unspecified part of unspecified bronchus or lung: Secondary | ICD-10-CM

## 2021-06-27 MED ORDER — HYDROCODONE-ACETAMINOPHEN 5-325 MG PO TABS
ORAL_TABLET | ORAL | 0 refills | Status: DC
Start: 2021-06-27 — End: 2021-08-04

## 2021-08-04 ENCOUNTER — Other Ambulatory Visit: Payer: Self-pay

## 2021-08-04 DIAGNOSIS — C3490 Malignant neoplasm of unspecified part of unspecified bronchus or lung: Secondary | ICD-10-CM

## 2021-08-09 MED ORDER — HYDROCODONE-ACETAMINOPHEN 5-325 MG PO TABS: ORAL_TABLET | ORAL | 0 refills | Status: DC

## 2021-08-23 ENCOUNTER — Other Ambulatory Visit: Payer: Self-pay | Admitting: Hematology

## 2021-08-23 DIAGNOSIS — C3490 Malignant neoplasm of unspecified part of unspecified bronchus or lung: Secondary | ICD-10-CM

## 2021-08-30 ENCOUNTER — Other Ambulatory Visit: Payer: Self-pay

## 2021-08-30 ENCOUNTER — Ambulatory Visit (INDEPENDENT_AMBULATORY_CARE_PROVIDER_SITE_OTHER): Payer: Medicare HMO | Admitting: Podiatry

## 2021-08-30 DIAGNOSIS — B351 Tinea unguium: Secondary | ICD-10-CM | POA: Diagnosis not present

## 2021-08-30 DIAGNOSIS — M79676 Pain in unspecified toe(s): Secondary | ICD-10-CM

## 2021-08-30 DIAGNOSIS — E119 Type 2 diabetes mellitus without complications: Secondary | ICD-10-CM

## 2021-08-31 ENCOUNTER — Encounter: Payer: Self-pay | Admitting: Podiatry

## 2021-08-31 NOTE — Progress Notes (Signed)
This patient returns to my office for at risk foot care.  This patient requires this care by a professional since this patient will be at risk due to having type 2 diabetes. He presents to the office with his wife.  This patient is unable to cut nails himself since the patient cannot reach his nails.These nails are painful walking and wearing shoes.  This patient presents for at risk foot care today.  General Appearance  Alert, conversant and in no acute stress.  Vascular  Dorsalis pedis and posterior tibial  pulses are palpable  bilaterally.  Capillary return is within normal limits  bilaterally. Temperature is within normal limits  bilaterally.  Neurologic  Senn-Weinstein monofilament wire test within normal limits  bilaterally. Muscle power within normal limits bilaterally.  Nails Thick disfigured discolored nails with subungual debris  from hallux to fifth toes bilaterally. No evidence of bacterial infection or drainage bilaterally.  Orthopedic  No limitations of motion  feet .  No crepitus or effusions noted.  No bony pathology or digital deformities noted.  Skin  normotropic skin with no porokeratosis noted bilaterally.  No signs of infections or ulcers noted.     Onychomycosis  Pain in right toes  Pain in left toes  Consent was obtained for treatment procedures.   Mechanical debridement of nails 1-5  bilaterally performed with a nail nipper.  Filed with dremel without incident.    Return office visit    3 months                Told patient to return for periodic foot care and evaluation due to potential at risk complications.   [place] Barefoot DPM

## 2021-09-13 ENCOUNTER — Other Ambulatory Visit: Payer: Self-pay

## 2021-09-13 DIAGNOSIS — C3490 Malignant neoplasm of unspecified part of unspecified bronchus or lung: Secondary | ICD-10-CM

## 2021-09-15 MED ORDER — HYDROCODONE-ACETAMINOPHEN 5-325 MG PO TABS: ORAL_TABLET | ORAL | 0 refills | Status: DC

## 2021-10-31 ENCOUNTER — Other Ambulatory Visit: Payer: Self-pay

## 2021-10-31 DIAGNOSIS — C3490 Malignant neoplasm of unspecified part of unspecified bronchus or lung: Secondary | ICD-10-CM

## 2021-11-02 MED ORDER — HYDROCODONE-ACETAMINOPHEN 5-325 MG PO TABS
ORAL_TABLET | ORAL | 0 refills | Status: DC
Start: 2021-11-02 — End: 2021-11-30

## 2021-11-03 ENCOUNTER — Other Ambulatory Visit: Payer: Self-pay

## 2021-11-03 ENCOUNTER — Ambulatory Visit (HOSPITAL_COMMUNITY)
Admission: RE | Admit: 2021-11-03 | Discharge: 2021-11-03 | Disposition: A | Discharge status: Home / Self Care | Payer: Medicare HMO | Source: Ambulatory Visit | Attending: Hematology | Admitting: Hematology

## 2021-11-03 DIAGNOSIS — C3490 Malignant neoplasm of unspecified part of unspecified bronchus or lung: Secondary | ICD-10-CM | POA: Insufficient documentation

## 2021-11-10 ENCOUNTER — Inpatient Hospital Stay: Payer: Medicare HMO | Attending: Hematology | Admitting: Hematology

## 2021-11-10 ENCOUNTER — Other Ambulatory Visit: Payer: Self-pay

## 2021-11-10 ENCOUNTER — Inpatient Hospital Stay: Payer: Medicare HMO

## 2021-11-10 DIAGNOSIS — C3490 Malignant neoplasm of unspecified part of unspecified bronchus or lung: Secondary | ICD-10-CM

## 2021-11-10 DIAGNOSIS — F1010 Alcohol abuse, uncomplicated: Secondary | ICD-10-CM | POA: Insufficient documentation

## 2021-11-10 DIAGNOSIS — Z809 Family history of malignant neoplasm, unspecified: Secondary | ICD-10-CM | POA: Insufficient documentation

## 2021-11-10 DIAGNOSIS — Z8249 Family history of ischemic heart disease and other diseases of the circulatory system: Secondary | ICD-10-CM | POA: Insufficient documentation

## 2021-11-10 DIAGNOSIS — I700 Atherosclerosis of aorta: Secondary | ICD-10-CM | POA: Insufficient documentation

## 2021-11-10 DIAGNOSIS — I509 Heart failure, unspecified: Secondary | ICD-10-CM | POA: Diagnosis not present

## 2021-11-10 DIAGNOSIS — E785 Hyperlipidemia, unspecified: Secondary | ICD-10-CM | POA: Diagnosis not present

## 2021-11-10 DIAGNOSIS — J449 Chronic obstructive pulmonary disease, unspecified: Secondary | ICD-10-CM | POA: Diagnosis not present

## 2021-11-10 DIAGNOSIS — E119 Type 2 diabetes mellitus without complications: Secondary | ICD-10-CM | POA: Diagnosis not present

## 2021-11-10 DIAGNOSIS — C3411 Malignant neoplasm of upper lobe, right bronchus or lung: Secondary | ICD-10-CM | POA: Diagnosis present

## 2021-11-10 DIAGNOSIS — I10 Essential (primary) hypertension: Secondary | ICD-10-CM | POA: Insufficient documentation

## 2021-11-10 DIAGNOSIS — Z8261 Family history of arthritis: Secondary | ICD-10-CM | POA: Diagnosis not present

## 2021-11-10 DIAGNOSIS — Z7289 Other problems related to lifestyle: Secondary | ICD-10-CM | POA: Diagnosis not present

## 2021-11-10 DIAGNOSIS — Z87891 Personal history of nicotine dependence: Secondary | ICD-10-CM | POA: Diagnosis not present

## 2021-11-10 DIAGNOSIS — J90 Pleural effusion, not elsewhere classified: Secondary | ICD-10-CM | POA: Diagnosis not present

## 2021-11-10 DIAGNOSIS — E039 Hypothyroidism, unspecified: Secondary | ICD-10-CM | POA: Insufficient documentation

## 2021-11-10 DIAGNOSIS — Z79899 Other long term (current) drug therapy: Secondary | ICD-10-CM | POA: Diagnosis not present

## 2021-11-10 LAB — CBC WITH DIFFERENTIAL/PLATELET
Abs Immature Granulocytes: 0.03 10*3/uL (ref 0.00–0.07)
Basophils Absolute: 0 10*3/uL (ref 0.0–0.1)
Basophils Relative: 0 %
Eosinophils Absolute: 0.3 10*3/uL (ref 0.0–0.5)
Eosinophils Relative: 3 %
HCT: 42.7 % (ref 39.0–52.0)
Hemoglobin: 13.9 g/dL (ref 13.0–17.0)
Immature Granulocytes: 0 %
Lymphocytes Relative: 9 %
Lymphs Abs: 0.8 10*3/uL (ref 0.7–4.0)
MCH: 28 pg (ref 26.0–34.0)
MCHC: 32.6 g/dL (ref 30.0–36.0)
MCV: 85.9 fL (ref 80.0–100.0)
Monocytes Absolute: 0.7 10*3/uL (ref 0.1–1.0)
Monocytes Relative: 8 %
Neutro Abs: 7 10*3/uL (ref 1.7–7.7)
Neutrophils Relative %: 80 %
Platelets: 153 10*3/uL (ref 150–400)
RBC: 4.97 MIL/uL (ref 4.22–5.81)
RDW: 14.6 % (ref 11.5–15.5)
WBC: 8.8 10*3/uL (ref 4.0–10.5)
nRBC: 0 % (ref 0.0–0.2)

## 2021-11-10 LAB — CMP (CANCER CENTER ONLY)
ALT: 10 U/L (ref 0–44)
AST: 14 U/L — ABNORMAL LOW (ref 15–41)
Albumin: 3.8 g/dL (ref 3.5–5.0)
Alkaline Phosphatase: 82 U/L (ref 38–126)
Anion gap: 7 (ref 5–15)
BUN: 12 mg/dL (ref 8–23)
CO2: 29 mmol/L (ref 22–32)
Calcium: 9.2 mg/dL (ref 8.9–10.3)
Chloride: 106 mmol/L (ref 98–111)
Creatinine: 0.94 mg/dL (ref 0.61–1.24)
GFR, Estimated: >60 mL/min (ref >60)
Glucose, Bld: 127 mg/dL — ABNORMAL HIGH (ref 70–99)
Potassium: 3.5 mmol/L (ref 3.5–5.1)
Sodium: 142 mmol/L (ref 135–145)
Total Bilirubin: 0.5 mg/dL (ref 0.3–1.2)
Total Protein: 7.7 g/dL (ref 6.5–8.1)

## 2021-11-13 ENCOUNTER — Telehealth: Payer: Self-pay | Admitting: Hematology

## 2021-11-13 NOTE — Telephone Encounter (Signed)
Left message with follow-up appointment per 2/10 los.

## 2021-11-17 NOTE — Progress Notes (Addendum)
HEMATOLOGY/ONCOLOGY CLINIC NOTE  Date of Service: .11/10/2021   Patient Care Team: Bernerd Limbo, MD as PCP - General (Family Medicine) Sherren Mocha, MD as PCP - Cardiology (Cardiology)  CHIEF COMPLAINTS/PURPOSE OF CONSULTATION:  Follow-up for continued evaluation and management of lung cancer  HISTORY OF PRESENTING ILLNESS:  Please see previous notes for details on initial presentation.  Interval History:   AMARE BAIL returns to clinic today of his daughter and wife for continued follow-up of his squamous cell carcinoma of the lung. He has had no sudden change in his breathing or new chest pain or shortness of breath. He continues to maintain a very sedentary lifestyle and his wife notes that he has started having worsening cognitive decline and worsening performance status with falls and difficulty with ambulation.  She notes that they have been in touch with his primary care physician to try with getting more help at home.  They are not inclined to have him go to a nursing home and the patient declines this option as well. No new cough no chest pain, no new shortness of breath. Labs done today 11/10/2021 show normal CBC, normal CMP. Patient had a CT of the chest without contrast done on 11/03/2021 which shows stable examination with right perihilar and paramediastinal postradiation changes with marked right-sided volume loss.  No evidence of recurrent or residual disease.  Liver cirrhosis.  MEDICAL HISTORY:  Past Medical History:  Diagnosis Date   Alcohol abuse    COPD (chronic obstructive pulmonary disease) ([place])    Diabetes mellitus without complication (HCC)    Echocardiogram    Echo 11/2018: EF 45-50, mild MAC, mild calc of AoV (difficult study) >> Limited echo with Definity contrast 11/2018: EF 50-55   Gout    High cholesterol    Hypertension    Malignant neoplasm of right upper lobe of lung ([place]) 03/29/2018    SURGICAL HISTORY: Past Surgical History:   Procedure Laterality Date   COLONOSCOPY     FLEXIBLE BRONCHOSCOPY [name] 03/21/2018   Procedure: FLEXIBLE BRONCHOSCOPY;  Surgeon: Marshell Garfinkel, MD;  Location: [facility] [place];  Service: Pulmonary;  Laterality: [name];   NOSE SURGERY  90's   VIDEO BRONCHOSCOPY WITH ENDOBRONCHIAL ULTRASOUND [name] 03/21/2018   Procedure: VIDEO BRONCHOSCOPY WITH ENDOBRONCHIAL ULTRASOUND;  Surgeon: Marshell Garfinkel, MD;  Location: [place];  Service: Pulmonary;  Laterality: [name];    SOCIAL HISTORY: Social History   Socioeconomic History   Marital status: Married    Spouse name: Not on file   Number of children: Not on file   Years of education: Not on file   Highest education level: Not on file  Occupational History   Not on file  Tobacco Use   Smoking status: Former    Packs/day: 0.25    Years: 50.00    Pack years: 12.50    Types: Cigarettes    Quit date: 07/2017    Years since quitting: 4.4   Smokeless tobacco: Never   Tobacco comments:    quit smoking 07/2017  Vaping Use   Vaping Use: Never used  Substance and Sexual Activity   Alcohol use: Yes    Alcohol/week: 14.0 - 21.0 standard drinks    Types: 14 - 21 Cans of beer per week   Drug use: No   Sexual activity: Not on file  Other Topics Concern   Not on file  Social History Narrative   Not on file   Social Determinants of Health   Financial Resource Strain:  Not on file  Food Insecurity: Not on file  Transportation Needs: Not on file  Physical Activity: Not on file  Stress: Not on file  Social Connections: Not on file  Intimate Partner Violence: Not on file    FAMILY HISTORY: Family History  Problem Relation Age of Onset   Arthritis Mother    Cancer Father    Cancer Sister    Heart disease Maternal Uncle    Colon cancer Neg Hx     ALLERGIES:  is allergic to neomycin-bacitracin zn-polymyx.  MEDICATIONS:  Current Outpatient Medications  Medication Sig Dispense Refill   albuterol (VENTOLIN HFA) 108 (90 Base) MCG/ACT inhaler INHALE 1 TO 2  PUFFS INTO THE LUNGS EVERY 4 HOURS AS NEEDED FOR WHEEZING OR SHORTNESS OF BREATH 6.7 each 2   ANORO ELLIPTA 62.5-25 MCG/INH AEPB INHALE 1 PUFF INTO THE LUNGS DAILY 60 each 1   aspirin 81 MG tablet Take 81 mg by mouth daily.     atorvastatin (LIPITOR) 40 MG tablet Take 40 mg by mouth daily.      benazepril (LOTENSIN) 20 MG tablet Take 20 mg by mouth daily.      diphenhydrAMINE (BENADRYL) 12.5 MG/5ML elixir Take 12.5 mg by mouth daily as needed for allergies.      folic acid (FOLVITE) 562 MCG tablet Take 400 mcg by mouth daily.     furosemide (LASIX) 40 MG tablet Take 40 mg by mouth daily.      HYDROcodone-acetaminophen (NORCO) 5-325 MG tablet 1/2 to 1 tablet 4 times daily as needed for pain. 60 tablet 0   ibuprofen (ADVIL,MOTRIN) 200 MG tablet Take 400 mg by mouth every 6 (six) hours as needed for headache, mild pain or moderate pain.     levothyroxine (SYNTHROID) 25 MCG tablet TAKE 1 TABLET BY MOUTH DAILY BEFORE BREAKFAST. (Patient taking differently: 75 mcg.) 90 tablet 1   metFORMIN (GLUCOPHAGE) 500 MG tablet Take 500 mg by mouth every morning.     metoprolol succinate (TOPROL-XL) 25 MG 24 hr tablet TAKE 1 TABLET BY MOUTH DAILY. PLEASE MAKE OVERDUE APPT WITH DR. BEFORE ANYMORE REFILLS 30 tablet 0   potassium chloride SA (K-DUR) 20 MEQ tablet Take 20 mEq by mouth daily.     prochlorperazine (COMPAZINE) 10 MG tablet Take 10 mg by mouth every 6 (six) hours as needed for nausea or vomiting.      UNABLE TO FIND Take by mouth.     No current facility-administered medications for this visit.    REVIEW OF SYSTEMS:   10 Point review of Systems was done is negative except as noted above.  PHYSICAL EXAMINATION: Vitals:   11/10/21 1403  BP: 134/84  Pulse: (!) 101  Resp: 18  Temp: 98.1 F (36.7 C)  SpO2: 96%   Wt Readings from Last 3 Encounters:  05/26/21 225 lb 6.4 oz (102.2 kg)  10/10/20 241 lb 1.6 oz (109.4 kg)  05/19/20 241 lb 1.6 oz (109.4 kg)   There is no height or weight on file to  calculate BMI.   NAD GENERAL:alert, in no acute distress and comfortable SKIN: no acute rashes, no significant lesions EYES: conjunctiva are pink and non-injected, sclera anicteric OROPHARYNX: MMM, no exudates, no oropharyngeal erythema or ulceration NECK: supple, no JVD LYMPH:  no palpable lymphadenopathy in the cervical, axillary or inguinal regions LUNGS: clear to auscultation b/l with normal respiratory effort HEART: regular rate & rhythm ABDOMEN:  normoactive bowel sounds , non tender, not distended. Extremity: no pedal edema PSYCH: alert &  oriented x 3 with fluent speech NEURO: no focal motor/sensory deficits   LABORATORY DATA:  I have reviewed the data as listed . CBC Latest Ref Rng & Units 11/10/2021 05/26/2021 05/19/2020  WBC 4.0 - 10.5 K/uL 8.8 10.0 8.9  Hemoglobin 13.0 - 17.0 g/dL 13.9 14.3 12.9(L)  Hematocrit 39.0 - 52.0 % 42.7 44.2 41.0  Platelets 150 - 400 K/uL 153 150 153    . CMP Latest Ref Rng & Units 11/10/2021 05/26/2021 05/19/2020  Glucose 70 - 99 mg/dL 127(H) 182(H) 143(H)  BUN 8 - 23 mg/dL 12 19 28(H)  Creatinine 0.61 - 1.24 mg/dL 0.94 1.23 1.28(H)  Sodium 135 - 145 mmol/L 142 141 141  Potassium 3.5 - 5.1 mmol/L 3.5 4.1 4.3  Chloride 98 - 111 mmol/L 106 105 104  CO2 22 - 32 mmol/L _0 Calcium 8.9 - 10.3 mg/dL 9.2 9.4 9.9  Total Protein 6.5 - 8.1 g/dL 7.7 7.9 7.8  Total Bilirubin 0.3 - 1.2 mg/dL 0.5 0.5 0.3  Alkaline Phos 38 - 126 U/L 82 80 66  AST 15 - 41 U/L 14(L) 14(L) 11(L)  ALT 0 - 44 U/L _1 08/10/2019  08/10/2019 Cytology- PAP  03/25/18 Endobronchial bx:     RADIOGRAPHIC STUDIES: I have personally reviewed the radiological images as listed and agreed with the findings in the report.  CT chest 08/21/2020  IMPRESSION: Radiation changes in the right hemithorax.   No findings specific for recurrent or metastatic disease. 3 mm subpleural nodule in the posterior right lower lobe, likely benign. Attention on follow-up is  suggested.   Small right pleural effusion, decreased.   Aortic Atherosclerosis (ICD10-I70.0).     Electronically Signed   By: Julian Hy M.D.   On: 08/21/2020 09:00  ASSESSMENT & PLAN:   76 y.o.  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  03/23/18 Brain MRI did not reveal any evidence of intracranial metastases 03/10/18 PET/CT revealed large intensely hypermetabolic Right hilar mass obstructs the Right upper lobe bronchus and surrounds the bronchus intermedus. Post obstructive collapse of the Right upper lobe. No hypermetabolic mediastinal or supraclavicular lymph nodes. No evidence of distant mets.  06/23/18 PET/CT revealed  Marked reduction in activity associated with the dominant right upper lobe central mass, maximum SUV 3.4 and previously 15.3. Please note that this lesion does still in case and markedly narrows the right upper lobe bronchus. 2. There is some patchy ground-glass opacities with small airspace opacity components in the right upper lobe, right middle lobe, right lower lobe, and lingula. Some of these are hypermetabolic, for example lingular opacity has a maximum SUV of 6.2 and the right lower lobe bandlike opacity 5.7. These are probably inflammatory and merit surveillance. 3. Small to moderate right pleural effusion with accentuated metabolic activity. Malignant effusion not excluded. 4. No findings of metastatic disease to the neck, abdomen/pelvis, or skeleton. 5. Aortic aneurysm NOS. Infrarenal aortic aneurysm 3.8 cm in diameter. Recommend followup by [place] in 2 years. 6. Other imaging findings of potential clinical significance: Chronic paranasal sinusitis. Aortic Atherosclerosis. Coronary atherosclerosis. Hepatic cirrhosis. Ventral hernia containing a loop of small bowel. Suspected late phase healing  of the right anterior eighth rib.   09/18/18 CT Chest revealed Rarely relatively stable perihilar consolidation on the RIGHT consistent post treatment change. Persistent narrowing of the proximal bronchi.  2. Interval increase in interstitial thickening in the RIGHT upper lobe. Differential include postobstructive process versus lymphangitic carcinoma. Favor postobstructive process. 3. Interval increase in RIGHT pleural effusion. 4. With potential progressive malignant findings versus progressive benign change, consider follow-up FDG PET scan.   10/02/18 Cytology from thoracentesis showed no malignant cells. 10/02/18 CXR revealed No evidence of pneumothorax after RIGHT thoracentesis. 2. Persistent large, loculated RIGHT pleural effusion and associated dense passive atelectasis in the RIGHT LOWER LOBE. 3. No new abnormalities.  10/29/18 CT Chest revealed Interval decrease in right pleural effusion with residual loculated component seen anteriorly and medially. 2. The interstitial and airspace disease in the posterior right upper lung is similar to prior. 3. Stable volume loss right hemithorax with right parahilar interstitial and airspace disease likely reflecting radiation fibrosis. 4. Nodular liver morphology suggests cirrhosis.  S/p 5 cycles of Durvalumab  03/06/19 CT Chest revealed "Stable appearing extensive radiation changes and treated tumor in the right upper lobe and right paramediastinal lung and right hilum. I do not see any definite CT findings for progressive disease. 2. Stable loculated right pleural fluid collections. 3. No CT findings to suggest metastatic pulmonary nodules in the left lung or metastatic disease involving the upper abdomen. 4. Stable cirrhotic changes involving the liver. Aortic Atherosclerosis and Emphysema."  11/24/18 ECHO revealed LV EF of 50-55%  12/31/19 CT C/A/P  ([id number]) ([id number]) revealed "1. Stable appearance of treated tumor within the right perihilar and  perihilar right lung. No specific findings identified to suggest local tumor recurrence. 2. Decrease in size of loculated right pleural effusion. 3. Cirrhosis. 4. Infrarenal abdominal aortic aneurysm."  2) h/o RUL airway obstruction with RUL lung collapse.  3) previous Concern for severe narrowing of the right main pulmonary artery as well as right upper lobe and right middle lobe arteries.   4.  Hypothyroidism - likely related to Durvalumab -On levothyroxine   5.  Patient Active Problem List   Diagnosis Date Noted   Squamous cell lung cancer, right ([place]) 04/04/2018   Counseling regarding advance care planning and goals of care 04/04/2018   Malignant neoplasm of right upper lobe of lung ([place]) 03/29/2018   Anorexia nervosa    Airway obstruction, anatomic    Loss of weight    Respiratory distress 03/21/2018   Lung mass 03/14/2018   Chronic diastolic CHF (congestive heart failure) ([place]) 02/09/2016   History of pulmonary embolism 02/09/2016   Need for hepatitis C screening test 01/11/2016   Dyspnea on exertion 12/19/2015   Fecal occult blood test positive 07/05/2015   Local edema 06/28/2015   Type 2 diabetes mellitus ([place]) 01/07/2015   Excessive urination at night 06/22/2014   Encounter for screening for cardiovascular disorders 01/29/2014   Adiposity 01/19/2014   Chronic obstructive pulmonary disease ([place]) 07/07/2013   Hypertensive heart disease 06/22/2013   Disease of skin and subcutaneous tissue 12/25/2012   Disease of nail 11/25/2012   Encounter for screening for eye and ear disorders 07/29/2012   Abnormal blood chemistry 12/10/2011   Healed or old pulmonary embolism 07/06/2011   Nondependent alcohol abuse 06/05/2011   Chronic nonalcoholic liver disease 03/30/1600   Thrombocytopenia ([place]) 07/12/2010   ABNORMAL ABDOMINAL ULTRASOUND 09/14/2008   Abnormal abdominal ultrasound 09/14/2008   HLD (hyperlipidemia) 09/08/2007   Hyperlipidemia associated with type 2 diabetes mellitus  ([place]) 09/08/2007   ALCOHOL ABUSE 02/25/2007   TOBACCO ABUSE 02/25/2007   INSOMNIA 02/25/2007   DEPENDENCE, ALCOHOL NEC/NOS, IN REMISSION 02/19/2007   HYPERTENSION 02/19/2007   COPD  02/19/2007    PLAN:  -Patient is here for follow-up of his lung cancer.  His labs today are stable. -He has no new respiratory symptoms suggestive of lung cancer progression. -Labs today are stable with normal CBC and CMP -CT chest without contrast shows no clear evidence of recurrent or residual lung cancer at this time. -He is having generalized decline in his overall functional status and cognitive decline as well.  He has had a history of heavy alcohol use in the past and multiple vascular risk factors.  He has needed increasing amounts of help at home and has been unable to care for himself. -I discussed with patient his wife and daughter that he might need additional help at home and they should approach his primary care physician for referral for home safety evaluation by home health nurse. nificant clinical symptoms suggestive of lung cancer progression at this time. -Continue 2000 IU Vitamin D daily  -Counseled again on smoking cessation. -Continue close follow-up with primary care physician for continued management of other medical issues including hypothyroidism, hypertension and diabetes CHF COPD etc..  FOLLOW UP: Return to clinic with Dr. Irene Limbo with labs in 4 months   All of the patient's questions were answered with apparent satisfaction. The patient knows to call the clinic with any problems, questions or concerns.   Sullivan Lone MD MS AAHIVMS [facility] Hematology/Oncology Physician [facility]        .[name]

## 2021-11-24 ENCOUNTER — Other Ambulatory Visit: Payer: Self-pay | Admitting: Hematology

## 2021-11-24 DIAGNOSIS — C3490 Malignant neoplasm of unspecified part of unspecified bronchus or lung: Secondary | ICD-10-CM

## 2021-11-30 ENCOUNTER — Other Ambulatory Visit: Payer: Self-pay

## 2021-11-30 DIAGNOSIS — C3490 Malignant neoplasm of unspecified part of unspecified bronchus or lung: Secondary | ICD-10-CM

## 2021-12-01 ENCOUNTER — Other Ambulatory Visit (HOSPITAL_COMMUNITY): Payer: Self-pay

## 2021-12-01 MED ORDER — HYDROCODONE-ACETAMINOPHEN 5-325 MG PO TABS
ORAL_TABLET | ORAL | 0 refills | Status: DC
  Filled 2021-12-01: qty 60, 15d supply, fill #0

## 2021-12-02 ENCOUNTER — Other Ambulatory Visit (HOSPITAL_COMMUNITY): Payer: Self-pay

## 2022-01-03 ENCOUNTER — Other Ambulatory Visit: Payer: Self-pay

## 2022-01-03 DIAGNOSIS — C3490 Malignant neoplasm of unspecified part of unspecified bronchus or lung: Secondary | ICD-10-CM

## 2022-01-04 MED ORDER — HYDROCODONE-ACETAMINOPHEN 5-325 MG PO TABS: ORAL_TABLET | ORAL | 0 refills | Status: DC

## 2022-01-29 ENCOUNTER — Ambulatory Visit: Payer: Medicare HMO | Admitting: Podiatry

## 2022-02-05 ENCOUNTER — Other Ambulatory Visit: Payer: Self-pay

## 2022-02-05 DIAGNOSIS — C3490 Malignant neoplasm of unspecified part of unspecified bronchus or lung: Secondary | ICD-10-CM

## 2022-02-05 MED ORDER — HYDROCODONE-ACETAMINOPHEN 5-325 MG PO TABS: ORAL_TABLET | ORAL | 0 refills | Status: DC

## 2022-02-26 ENCOUNTER — Other Ambulatory Visit: Payer: Self-pay | Admitting: Hematology

## 2022-02-26 DIAGNOSIS — C3490 Malignant neoplasm of unspecified part of unspecified bronchus or lung: Secondary | ICD-10-CM

## 2022-03-02 ENCOUNTER — Ambulatory Visit: Payer: Medicare HMO | Admitting: Podiatry

## 2022-03-09 ENCOUNTER — Other Ambulatory Visit: Payer: Self-pay

## 2022-03-09 DIAGNOSIS — C3490 Malignant neoplasm of unspecified part of unspecified bronchus or lung: Secondary | ICD-10-CM

## 2022-03-12 ENCOUNTER — Inpatient Hospital Stay: Payer: Medicare HMO | Admitting: Hematology

## 2022-03-12 ENCOUNTER — Other Ambulatory Visit: Payer: Self-pay

## 2022-03-12 ENCOUNTER — Inpatient Hospital Stay: Payer: Medicare HMO

## 2022-03-12 DIAGNOSIS — C3490 Malignant neoplasm of unspecified part of unspecified bronchus or lung: Secondary | ICD-10-CM

## 2022-03-12 MED ORDER — HYDROCODONE-ACETAMINOPHEN 5-325 MG PO TABS: ORAL_TABLET | ORAL | 0 refills | Status: DC

## 2022-03-28 ENCOUNTER — Inpatient Hospital Stay: Payer: Medicare HMO

## 2022-03-28 ENCOUNTER — Ambulatory Visit: Payer: Medicare HMO | Admitting: Podiatry

## 2022-03-28 ENCOUNTER — Inpatient Hospital Stay: Payer: Medicare HMO | Admitting: Hematology

## 2022-04-13 ENCOUNTER — Other Ambulatory Visit: Payer: Self-pay

## 2022-04-13 DIAGNOSIS — C3490 Malignant neoplasm of unspecified part of unspecified bronchus or lung: Secondary | ICD-10-CM

## 2022-04-17 ENCOUNTER — Other Ambulatory Visit: Payer: Self-pay

## 2022-04-17 DIAGNOSIS — C3490 Malignant neoplasm of unspecified part of unspecified bronchus or lung: Secondary | ICD-10-CM

## 2022-04-23 ENCOUNTER — Ambulatory Visit: Payer: Medicare HMO | Admitting: Podiatry

## 2022-04-23 MED ORDER — HYDROCODONE-ACETAMINOPHEN 5-325 MG PO TABS: ORAL_TABLET | ORAL | 0 refills | Status: DC

## 2022-05-01 ENCOUNTER — Inpatient Hospital Stay: Payer: Medicare HMO | Admitting: Hematology

## 2022-05-01 ENCOUNTER — Inpatient Hospital Stay: Payer: Medicare HMO

## 2022-05-11 ENCOUNTER — Ambulatory Visit: Payer: Medicare HMO | Admitting: Podiatry

## 2022-05-18 ENCOUNTER — Ambulatory Visit: Payer: Medicare HMO | Admitting: Podiatry

## 2022-05-31 ENCOUNTER — Telehealth: Payer: Self-pay | Admitting: *Deleted

## 2022-05-31 ENCOUNTER — Other Ambulatory Visit: Payer: Self-pay | Admitting: *Deleted

## 2022-05-31 DIAGNOSIS — C3490 Malignant neoplasm of unspecified part of unspecified bronchus or lung: Secondary | ICD-10-CM

## 2022-05-31 MED ORDER — HYDROCODONE-ACETAMINOPHEN 5-325 MG PO TABS: ORAL_TABLET | ORAL | 0 refills | Status: DC

## 2022-05-31 NOTE — Telephone Encounter (Signed)
Contacted Ms. Barry Booker to inform that prescription sent to husband's pharmacy. She verbalized understanding

## 2022-05-31 NOTE — Telephone Encounter (Signed)
Ms. Brodzinski called - requested refill of Mr. Laverdiere pain medication Refill routed to Dr. Irene Limbo

## 2022-06-19 ENCOUNTER — Inpatient Hospital Stay: Payer: Medicare HMO | Attending: Family Medicine

## 2022-06-19 ENCOUNTER — Inpatient Hospital Stay: Payer: Medicare HMO | Admitting: Hematology

## 2022-06-29 ENCOUNTER — Telehealth: Payer: Self-pay | Admitting: Hematology

## 2022-06-29 NOTE — Telephone Encounter (Signed)
Left message about appt

## 2022-07-04 ENCOUNTER — Encounter: Payer: Self-pay | Admitting: Podiatry

## 2022-07-04 ENCOUNTER — Ambulatory Visit (INDEPENDENT_AMBULATORY_CARE_PROVIDER_SITE_OTHER): Payer: Medicare HMO | Admitting: Podiatry

## 2022-07-04 DIAGNOSIS — E119 Type 2 diabetes mellitus without complications: Secondary | ICD-10-CM

## 2022-07-04 DIAGNOSIS — M79676 Pain in unspecified toe(s): Secondary | ICD-10-CM | POA: Diagnosis not present

## 2022-07-04 DIAGNOSIS — M19079 Primary osteoarthritis, unspecified ankle and foot: Secondary | ICD-10-CM

## 2022-07-04 DIAGNOSIS — B351 Tinea unguium: Secondary | ICD-10-CM | POA: Diagnosis not present

## 2022-07-04 NOTE — Progress Notes (Signed)
This patient returns to my office for at risk foot care.  This patient requires this care by a professional since this patient will be at risk due to having type 2 diabetes. He presents to the office with his wife.  This patient is unable to cut nails himself since the patient cannot reach his nails.These nails are painful walking and wearing shoes.  This patient presents for at risk foot care today. Patient has not been seen in over 11 months.  He presently is in a wheelchair accompanied by his wife and granddaughter.  General Appearance  Alert, conversant and in no acute stress.  Vascular  Dorsalis pedis and posterior tibial  pulses are weakly  palpable  bilaterally.  Capillary return is within normal limits  bilaterally. Temperature is within normal limits  bilaterally.  Neurologic  Senn-Weinstein monofilament wire test within normal limits  bilaterally. Muscle power within normal limits bilaterally.  Nails Thick disfigured discolored nails with subungual debris  from hallux to fifth toes bilaterally. No evidence of bacterial infection or drainage bilaterally.  Orthopedic  No limitations of motion  feet .  No crepitus or effusions noted.  No bony pathology or digital deformities noted.  Skin  normotropic skin with no porokeratosis noted bilaterally.  No signs of infections or ulcers noted.     Onychomycosis  Pain in right toes  Pain in left toes  Consent was obtained for treatment procedures.   Mechanical debridement of nails 1-5  bilaterally performed with a nail nipper.  Filed with dremel without incident.    Return office visit    4 months                Told patient to return for periodic foot care and evaluation due to potential at risk complications.   Gardiner Barefoot DPM

## 2022-07-11 ENCOUNTER — Other Ambulatory Visit: Payer: Self-pay | Admitting: Hematology

## 2022-07-11 DIAGNOSIS — C3490 Malignant neoplasm of unspecified part of unspecified bronchus or lung: Secondary | ICD-10-CM

## 2022-07-23 ENCOUNTER — Inpatient Hospital Stay (HOSPITAL_BASED_OUTPATIENT_CLINIC_OR_DEPARTMENT_OTHER): Payer: Medicare HMO | Admitting: Hematology

## 2022-07-23 ENCOUNTER — Other Ambulatory Visit: Payer: Self-pay

## 2022-07-23 ENCOUNTER — Inpatient Hospital Stay: Payer: Medicare HMO | Attending: Family Medicine

## 2022-07-23 VITALS — BP 145/88 | HR 86 | Temp 97.8°F | Resp 17 | Ht 70.0 in

## 2022-07-23 DIAGNOSIS — C3490 Malignant neoplasm of unspecified part of unspecified bronchus or lung: Secondary | ICD-10-CM

## 2022-07-23 DIAGNOSIS — Z85118 Personal history of other malignant neoplasm of bronchus and lung: Secondary | ICD-10-CM | POA: Diagnosis present

## 2022-07-23 DIAGNOSIS — E039 Hypothyroidism, unspecified: Secondary | ICD-10-CM | POA: Insufficient documentation

## 2022-07-23 DIAGNOSIS — Z08 Encounter for follow-up examination after completed treatment for malignant neoplasm: Secondary | ICD-10-CM | POA: Insufficient documentation

## 2022-07-23 DIAGNOSIS — Z7189 Other specified counseling: Secondary | ICD-10-CM

## 2022-07-23 DIAGNOSIS — C3491 Malignant neoplasm of unspecified part of right bronchus or lung: Secondary | ICD-10-CM | POA: Diagnosis not present

## 2022-07-23 LAB — CBC WITH DIFFERENTIAL (CANCER CENTER ONLY)
Abs Immature Granulocytes: 0.03 10*3/uL (ref 0.00–0.07)
Basophils Absolute: 0 10*3/uL (ref 0.0–0.1)
Basophils Relative: 0 %
Eosinophils Absolute: 0.2 10*3/uL (ref 0.0–0.5)
Eosinophils Relative: 2 %
HCT: 44.1 % (ref 39.0–52.0)
Hemoglobin: 13.9 g/dL (ref 13.0–17.0)
Immature Granulocytes: 0 %
Lymphocytes Relative: 8 %
Lymphs Abs: 0.6 10*3/uL — ABNORMAL LOW (ref 0.7–4.0)
MCH: 27.5 pg (ref 26.0–34.0)
MCHC: 31.5 g/dL (ref 30.0–36.0)
MCV: 87.2 fL (ref 80.0–100.0)
Monocytes Absolute: 0.6 10*3/uL (ref 0.1–1.0)
Monocytes Relative: 8 %
Neutro Abs: 6.5 10*3/uL (ref 1.7–7.7)
Neutrophils Relative %: 82 %
Platelet Count: 149 10*3/uL — ABNORMAL LOW (ref 150–400)
RBC: 5.06 MIL/uL (ref 4.22–5.81)
RDW: 16.3 % — ABNORMAL HIGH (ref 11.5–15.5)
WBC Count: 7.9 10*3/uL (ref 4.0–10.5)
nRBC: 0 % (ref 0.0–0.2)

## 2022-07-23 NOTE — Progress Notes (Signed)
HEMATOLOGY/ONCOLOGY CLINIC NOTE  Date of Service: 07/23/22    Patient Care Team: Bernerd Limbo, MD as PCP - General (Family Medicine) Sherren Mocha, MD as PCP - Cardiology (Cardiology)  CHIEF COMPLAINTS/PURPOSE OF CONSULTATION:  Follow-up for continued evaluation and management of lung cancer  HISTORY OF PRESENTING ILLNESS:  Please see previous notes for details on initial presentation.  Interval History:   Barry Booker returns to clinic today with his daughter and wife for continued follow-up of his squamous cell carcinoma of the lung.  He was last seen by me on 11/10/2021 and was doing well.   He notes he has been doing well. His daughter reports he has been stable. He denies any change with difficulty breathing, shortness of breath, pain, loss of appetite. His daughter notes that he has some problems with breathing at night.   He denies going to nursing home. He would like to receive help at home, if needed.   His wife reports that he has been having numbness on his right hand, which causes him to not hold spoon or forks. Although, he denies this.   His wife has been taking care of him, but she has been having medical problems as well. Their daughter has been staying with them and taking care of them.   He complains of problems understanding people and it has worsen since last visit. He has dementia. His wife and daughter also reports that he occasionally has problem with speaking.  His wife states that he was having mild breathing problems yesterday, but is better today. He has not seen his PCP over a year.   He denies fever, chills, headaches, abnormal bowl moments, and abdominal pain. He has mild congestion, which might be due to allergies. He has his inhaler and states it helps him.  His daughter notes that the best option for his long-term care is to see his PCP. She wants to hold off with imaging studies until they visit his PCP.    MEDICAL HISTORY:  Past  Medical History:  Diagnosis Date   Alcohol abuse    COPD (chronic obstructive pulmonary disease) ([place])    Diabetes mellitus without complication (HCC)    Echocardiogram    Echo 11/2018: EF 45-50, mild MAC, mild calc of AoV (difficult study) >> Limited echo with Definity contrast 11/2018: EF 50-55   Gout    High cholesterol    Hypertension    Malignant neoplasm of right upper lobe of lung ([place]) 03/29/2018    SURGICAL HISTORY: Past Surgical History:  Procedure Laterality Date   COLONOSCOPY     FLEXIBLE BRONCHOSCOPY [name] 03/21/2018   Procedure: FLEXIBLE BRONCHOSCOPY;  Surgeon: Marshell Garfinkel, MD;  Location: [facility];  Service: Pulmonary;  Laterality: [name];   NOSE SURGERY  90's   VIDEO BRONCHOSCOPY WITH ENDOBRONCHIAL ULTRASOUND [name] 03/21/2018   Procedure: VIDEO BRONCHOSCOPY WITH ENDOBRONCHIAL ULTRASOUND;  Surgeon: Marshell Garfinkel, MD;  Location: [facility];  Service: Pulmonary;  Laterality: [name];    SOCIAL HISTORY: Social History   Socioeconomic History   Marital status: Married    Spouse name: Not on file   Number of children: Not on file   Years of education: Not on file   Highest education level: Not on file  Occupational History   Not on file  Tobacco Use   Smoking status: Former    Packs/day: 0.25    Years: 50.00    Total pack years: 12.50    Types: Cigarettes    Quit  date: 07/2017    Years since quitting: 5.0   Smokeless tobacco: Never   Tobacco comments:    quit smoking 07/2017  Vaping Use   Vaping Use: Never used  Substance and Sexual Activity   Alcohol use: Yes    Alcohol/week: 14.0 - 21.0 standard drinks of alcohol    Types: 14 - 21 Cans of beer per week   Drug use: No   Sexual activity: Not on file  Other Topics Concern   Not on file  Social History Narrative   Not on file   Social Determinants of Health   Financial Resource Strain: Not on file  Food Insecurity: Not on file  Transportation Needs: Not on file  Physical Activity: Not on file  Stress: Not on file   Social Connections: Not on file  Intimate Partner Violence: Not on file    FAMILY HISTORY: Family History  Problem Relation Age of Onset   Arthritis Mother    Cancer Father    Cancer Sister    Heart disease Maternal Uncle    Colon cancer Neg Hx     ALLERGIES:  is allergic to neomycin-bacitracin zn-polymyx.  MEDICATIONS:  Current Outpatient Medications  Medication Sig Dispense Refill   albuterol (VENTOLIN HFA) 108 (90 Base) MCG/ACT inhaler INHALE 1 TO 2 PUFFS INTO THE LUNGS EVERY 4 HOURS AS NEEDED FOR WHEEZING OR SHORTNESS OF BREATH 6.7 each 2   ANORO ELLIPTA 62.5-25 MCG/INH AEPB INHALE 1 PUFF INTO THE LUNGS DAILY 60 each 1   aspirin 81 MG tablet Take 81 mg by mouth daily.     atorvastatin (LIPITOR) 40 MG tablet Take 40 mg by mouth daily.      benazepril (LOTENSIN) 20 MG tablet Take 20 mg by mouth daily.      diphenhydrAMINE (BENADRYL) 12.5 MG/5ML elixir Take 12.5 mg by mouth daily as needed for allergies.      folic acid (FOLVITE) 659 MCG tablet Take 400 mcg by mouth daily.     furosemide (LASIX) 40 MG tablet Take 40 mg by mouth daily.      HYDROcodone-acetaminophen (NORCO) 5-325 MG tablet Take 1/2 to 1 tablet by mouth  4 times daily as needed for pain. 60 tablet 0   ibuprofen (ADVIL,MOTRIN) 200 MG tablet Take 400 mg by mouth every 6 (six) hours as needed for headache, mild pain or moderate pain.     levothyroxine (SYNTHROID) 25 MCG tablet TAKE 1 TABLET BY MOUTH DAILY BEFORE BREAKFAST. (Patient taking differently: 75 mcg.) 90 tablet 1   metFORMIN (GLUCOPHAGE) 500 MG tablet Take 500 mg by mouth every morning.     metoprolol succinate (TOPROL-XL) 25 MG 24 hr tablet TAKE 1 TABLET BY MOUTH DAILY. PLEASE MAKE OVERDUE APPT WITH DR. BEFORE ANYMORE REFILLS 30 tablet 0   potassium chloride SA (K-DUR) 20 MEQ tablet Take 20 mEq by mouth daily.     prochlorperazine (COMPAZINE) 10 MG tablet Take 10 mg by mouth every 6 (six) hours as needed for nausea or vomiting.      UNABLE TO FIND Take by  mouth.     No current facility-administered medications for this visit.    REVIEW OF SYSTEMS:   10 Point review of Systems was done is negative except as noted above.  PHYSICAL EXAMINATION: Vitals:   07/23/22 1236  BP: (!) 145/88  Pulse: 86  Resp: 17  Temp: 97.8 F (36.6 C)  SpO2: 97%    Wt Readings from Last 3 Encounters:  05/26/21 225 lb 6.4  oz (102.2 kg)  10/10/20 241 lb 1.6 oz (109.4 kg)  05/19/20 241 lb 1.6 oz (109.4 kg)   Body mass index is 32.34 kg/m.   NAD GENERAL:alert, in no acute distress and comfortable SKIN: no acute rashes, no significant lesions EYES: conjunctiva are pink and non-injected, sclera anicteric OROPHARYNX: MMM, no exudates, no oropharyngeal erythema or ulceration NECK: supple, no JVD LYMPH:  no palpable lymphadenopathy in the cervical, axillary or inguinal regions LUNGS: clear to auscultation b/l with normal respiratory effort HEART: regular rate & rhythm ABDOMEN:  normoactive bowel sounds , non tender, not distended. Extremity: no pedal edema PSYCH: alert & oriented x 3 with fluent speech NEURO: no focal motor/sensory deficits   LABORATORY DATA:  I have reviewed the data as listed .    Latest Ref Rng & Units 07/23/2022   12:27 PM 11/10/2021    1:03 PM 05/26/2021   11:09 AM  CBC  WBC 4.0 - 10.5 K/uL 7.9  8.8  10.0   Hemoglobin 13.0 - 17.0 g/dL 13.9  13.9  14.3   Hematocrit 39.0 - 52.0 % 44.1  42.7  44.2   Platelets 150 - 400 K/uL 149  153  150     .    Latest Ref Rng & Units 11/10/2021    1:03 PM 05/26/2021   11:09 AM 05/19/2020   10:00 AM  CMP  Glucose 70 - 99 mg/dL 127  182  143   BUN 8 - 23 mg/dL _0 Creatinine 0.61 - 1.24 mg/dL 0.94  1.23  1.28   Sodium 135 - 145 mmol/L 142  141  141   Potassium 3.5 - 5.1 mmol/L 3.5  4.1  4.3   Chloride 98 - 111 mmol/L 106  105  104   CO2 22 - 32 mmol/L _1 Calcium 8.9 - 10.3 mg/dL 9.2  9.4  9.9   Total Protein 6.5 - 8.1 g/dL 7.7  7.9  7.8   Total Bilirubin 0.3 - 1.2  mg/dL 0.5  0.5  0.3   Alkaline Phos 38 - 126 U/L 82  80  66   AST 15 - 41 U/L _2 ALT 0 - 44 U/L _3 08/10/2019  08/10/2019 Cytology- PAP  03/25/18 Endobronchial bx:     RADIOGRAPHIC STUDIES: I have personally reviewed the radiological images as listed and agreed with the findings in the report.  CT chest 08/21/2020  IMPRESSION: Radiation changes in the right hemithorax.   No findings specific for recurrent or metastatic disease. 3 mm subpleural nodule in the posterior right lower lobe, likely benign. Attention on follow-up is suggested.   Small right pleural effusion, decreased.   Aortic Atherosclerosis (ICD10-I70.0).     Electronically Signed   By: Julian Hy M.D.   On: 08/21/2020 09:00  ASSESSMENT & PLAN:   76 y.o.  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  03/23/18 Brain MRI did not reveal any evidence of intracranial metastases 03/10/18 PET/CT revealed large intensely hypermetabolic Right hilar mass obstructs the Right upper lobe bronchus and surrounds the bronchus intermedus. Post obstructive collapse of the Right upper lobe. No hypermetabolic mediastinal or supraclavicular lymph nodes. No evidence of distant mets.  06/23/18 PET/CT revealed  Marked reduction in  activity associated with the dominant right upper lobe central mass, maximum SUV 3.4 and previously 15.3. Please note that this lesion does still in case and markedly narrows the right upper lobe bronchus. 2. There is some patchy ground-glass opacities with small airspace opacity components in the right upper lobe, right middle lobe, right lower lobe, and lingula. Some of these are hypermetabolic, for example lingular opacity has a maximum SUV of 6.2 and the right lower lobe bandlike opacity 5.7. These are probably  inflammatory and merit surveillance. 3. Small to moderate right pleural effusion with accentuated metabolic activity. Malignant effusion not excluded. 4. No findings of metastatic disease to the neck, abdomen/pelvis, or skeleton. 5. Aortic aneurysm NOS. Infrarenal aortic aneurysm 3.8 cm in diameter. Recommend followup by [place] in 2 years. 6. Other imaging findings of potential clinical significance: Chronic paranasal sinusitis. Aortic Atherosclerosis. Coronary atherosclerosis. Hepatic cirrhosis. Ventral hernia containing a loop of small bowel. Suspected late phase healing of the right anterior eighth rib.   09/18/18 CT Chest revealed Rarely relatively stable perihilar consolidation on the RIGHT consistent post treatment change. Persistent narrowing of the proximal bronchi. 2. Interval increase in interstitial thickening in the RIGHT upper lobe. Differential include postobstructive process versus lymphangitic carcinoma. Favor postobstructive process. 3. Interval increase in RIGHT pleural effusion. 4. With potential progressive malignant findings versus progressive benign change, consider follow-up FDG PET scan.   10/02/18 Cytology from thoracentesis showed no malignant cells. 10/02/18 CXR revealed No evidence of pneumothorax after RIGHT thoracentesis. 2. Persistent large, loculated RIGHT pleural effusion and associated dense passive atelectasis in the RIGHT LOWER LOBE. 3. No new abnormalities.  10/29/18 CT Chest revealed Interval decrease in right pleural effusion with residual loculated component seen anteriorly and medially. 2. The interstitial and airspace disease in the posterior right upper lung is similar to prior. 3. Stable volume loss right hemithorax with right parahilar interstitial and airspace disease likely reflecting radiation fibrosis. 4. Nodular liver morphology suggests cirrhosis.  S/p 5 cycles of Durvalumab  03/06/19 CT Chest revealed "Stable appearing extensive radiation changes and treated tumor  in the right upper lobe and right paramediastinal lung and right hilum. I do not see any definite CT findings for progressive disease. 2. Stable loculated right pleural fluid collections. 3. No CT findings to suggest metastatic pulmonary nodules in the left lung or metastatic disease involving the upper abdomen. 4. Stable cirrhotic changes involving the liver. Aortic Atherosclerosis and Emphysema."  11/24/18 ECHO revealed LV EF of 50-55%  12/31/19 CT C/A/P  ([id number]) ([id number]) revealed "1. Stable appearance of treated tumor within the right perihilar and perihilar right lung. No specific findings identified to suggest local tumor recurrence. 2. Decrease in size of loculated right pleural effusion. 3. Cirrhosis. 4. Infrarenal abdominal aortic aneurysm."  2) h/o RUL airway obstruction with RUL lung collapse.  3) previous Concern for severe narrowing of the right main pulmonary artery as well as right upper lobe and right middle lobe arteries.   4.  Hypothyroidism - likely related to Durvalumab -On levothyroxine   5.  Patient Active Problem List   Diagnosis Date Noted   Squamous cell lung cancer, right ([place]) 04/04/2018   Counseling regarding advance care planning and goals of care 04/04/2018   Malignant neoplasm of right upper lobe of lung ([place]) 03/29/2018   Anorexia nervosa    Airway obstruction, anatomic    Loss of weight    Respiratory distress 03/21/2018   Lung mass 03/14/2018   Chronic diastolic CHF (congestive heart failure) ([place]) 02/09/2016  History of pulmonary embolism 02/09/2016   Need for hepatitis C screening test 01/11/2016   Dyspnea on exertion 12/19/2015   Fecal occult blood test positive 07/05/2015   Local edema 06/28/2015   Type 2 diabetes mellitus ([place]) 01/07/2015   Excessive urination at night 06/22/2014   Encounter for screening for cardiovascular disorders 01/29/2014   Adiposity 01/19/2014   Chronic obstructive pulmonary disease ([place]) 07/07/2013    Hypertensive heart disease 06/22/2013   Disease of skin and subcutaneous tissue 12/25/2012   Disease of nail 11/25/2012   Encounter for screening for eye and ear disorders 07/29/2012   Abnormal blood chemistry 12/10/2011   Healed or old pulmonary embolism 07/06/2011   Nondependent alcohol abuse 06/05/2011   Chronic nonalcoholic liver disease 26/83/4196   Thrombocytopenia ([place]) 07/12/2010   ABNORMAL ABDOMINAL ULTRASOUND 09/14/2008   Abnormal abdominal ultrasound 09/14/2008   HLD (hyperlipidemia) 09/08/2007   Hyperlipidemia associated with type 2 diabetes mellitus ([place]) 09/08/2007   ALCOHOL ABUSE 02/25/2007   TOBACCO ABUSE 02/25/2007   INSOMNIA 02/25/2007   DEPENDENCE, ALCOHOL NEC/NOS, IN REMISSION 02/19/2007   HYPERTENSION 02/19/2007   COPD 02/19/2007    PLAN:  -Discussed his labs today and were stable. -He is having generalized decline in his overall functional status and cognitive decline as well. He has had a history of heavy alcohol use in the past and multiple vascular risk factors.  He has needed increasing amounts of help at home and has been unable to care for himself. -Discussed with patient his wife and daughter that he might need additional help at home and they should approach his primary care physician for referral for home safety evaluation by home health nurse. -Continue close follow-up with primary care physician for continued management of other medical issues including hypothyroidism, hypertension and diabetes CHF COPD etc.. -Discussed all his options about his long-term care: home care evaluation, hospice, and palliative care with the patient, his wife, and his daughter.  -Family wants to hold off on images and follow-up with PCP for his long-term care.  -Recommend following up with PCP for chronic management.   FOLLOW UP: RTC with Dr Irene Limbo with labs in 6 months  The total time spent in the appointment was 35 minutes* .  All of the patient's questions were answered  with apparent satisfaction. The patient knows to call the clinic with any problems, questions or concerns.   Zettie Cooley, am acting as a scribe for Sullivan Lone, MD.  Sullivan Lone MD [place] AAHIVMS [facility] Hematology/Oncology Physician [facility]  .*Total Encounter Time as defined by the Centers for Medicare and Medicaid Services includes, in addition to the face-to-face time of a patient visit (documented in the note above) non-face-to-face time: obtaining and reviewing outside history, ordering and reviewing medications, tests or procedures, care coordination (communications with other health care professionals or caregivers) and documentation in the medical record.

## 2022-08-06 ENCOUNTER — Other Ambulatory Visit: Payer: Self-pay

## 2022-08-06 DIAGNOSIS — C3490 Malignant neoplasm of unspecified part of unspecified bronchus or lung: Secondary | ICD-10-CM

## 2022-08-08 MED ORDER — HYDROCODONE-ACETAMINOPHEN 5-325 MG PO TABS: ORAL_TABLET | ORAL | 0 refills | Status: DC

## 2022-09-14 ENCOUNTER — Other Ambulatory Visit: Payer: Self-pay

## 2022-09-14 DIAGNOSIS — C3490 Malignant neoplasm of unspecified part of unspecified bronchus or lung: Secondary | ICD-10-CM

## 2022-09-14 MED ORDER — HYDROCODONE-ACETAMINOPHEN 5-325 MG PO TABS: ORAL_TABLET | ORAL | 0 refills | Status: DC

## 2022-09-21 ENCOUNTER — Other Ambulatory Visit: Payer: Self-pay | Admitting: Hematology

## 2022-09-21 DIAGNOSIS — C3490 Malignant neoplasm of unspecified part of unspecified bronchus or lung: Secondary | ICD-10-CM

## 2022-10-15 ENCOUNTER — Emergency Department (HOSPITAL_COMMUNITY)
Admission: EM | Admit: 2022-10-15 | Discharge: 2022-10-15 | Disposition: A | Discharge status: Home / Self Care | Payer: Medicare HMO | Attending: Emergency Medicine | Admitting: Emergency Medicine

## 2022-10-15 ENCOUNTER — Other Ambulatory Visit: Payer: Self-pay

## 2022-10-15 ENCOUNTER — Emergency Department (HOSPITAL_COMMUNITY): Payer: Medicare HMO

## 2022-10-15 ENCOUNTER — Encounter (HOSPITAL_COMMUNITY): Payer: Self-pay

## 2022-10-15 DIAGNOSIS — W19XXXA Unspecified fall, initial encounter: Secondary | ICD-10-CM | POA: Diagnosis not present

## 2022-10-15 DIAGNOSIS — S6991XA Unspecified injury of right wrist, hand and finger(s), initial encounter: Secondary | ICD-10-CM | POA: Diagnosis present

## 2022-10-15 DIAGNOSIS — Z7982 Long term (current) use of aspirin: Secondary | ICD-10-CM | POA: Insufficient documentation

## 2022-10-15 DIAGNOSIS — R2689 Other abnormalities of gait and mobility: Secondary | ICD-10-CM

## 2022-10-15 DIAGNOSIS — I6782 Cerebral ischemia: Secondary | ICD-10-CM | POA: Diagnosis not present

## 2022-10-15 DIAGNOSIS — S61411A Laceration without foreign body of right hand, initial encounter: Secondary | ICD-10-CM | POA: Diagnosis not present

## 2022-10-15 DIAGNOSIS — Z1152 Encounter for screening for COVID-19: Secondary | ICD-10-CM | POA: Insufficient documentation

## 2022-10-15 DIAGNOSIS — F0390 Unspecified dementia without behavioral disturbance: Secondary | ICD-10-CM | POA: Diagnosis not present

## 2022-10-15 DIAGNOSIS — R2681 Unsteadiness on feet: Secondary | ICD-10-CM | POA: Diagnosis not present

## 2022-10-15 LAB — COMPREHENSIVE METABOLIC PANEL
ALT: 12 U/L (ref 0–44)
AST: 17 U/L (ref 15–41)
Albumin: 3.4 g/dL — ABNORMAL LOW (ref 3.5–5.0)
Alkaline Phosphatase: 62 U/L (ref 38–126)
Anion gap: 9 (ref 5–15)
BUN: 12 mg/dL (ref 8–23)
CO2: 26 mmol/L (ref 22–32)
Calcium: 8.8 mg/dL — ABNORMAL LOW (ref 8.9–10.3)
Chloride: 104 mmol/L (ref 98–111)
Creatinine, Ser: 0.95 mg/dL (ref 0.61–1.24)
GFR, Estimated: >60 mL/min (ref >60)
Glucose, Bld: 118 mg/dL — ABNORMAL HIGH (ref 70–99)
Potassium: 3.4 mmol/L — ABNORMAL LOW (ref 3.5–5.1)
Sodium: 139 mmol/L (ref 135–145)
Total Bilirubin: 0.4 mg/dL (ref 0.3–1.2)
Total Protein: 8.1 g/dL (ref 6.5–8.1)

## 2022-10-15 LAB — URINALYSIS, ROUTINE W REFLEX MICROSCOPIC
Bilirubin Urine: NEGATIVE
Glucose, UA: NEGATIVE mg/dL
Hgb urine dipstick: NEGATIVE
Ketones, ur: NEGATIVE mg/dL
Leukocytes,Ua: NEGATIVE
Nitrite: NEGATIVE
Protein, ur: NEGATIVE mg/dL
Specific Gravity, Urine: 1.019 (ref 1.005–1.030)
pH: 5 (ref 5.0–8.0)

## 2022-10-15 LAB — CBC
HCT: 48.6 % (ref 39.0–52.0)
Hemoglobin: 14.7 g/dL (ref 13.0–17.0)
MCH: 27.2 pg (ref 26.0–34.0)
MCHC: 30.2 g/dL (ref 30.0–36.0)
MCV: 89.8 fL (ref 80.0–100.0)
Platelets: 160 10*3/uL (ref 150–400)
RBC: 5.41 MIL/uL (ref 4.22–5.81)
RDW: 15.2 % (ref 11.5–15.5)
WBC: 9.5 10*3/uL (ref 4.0–10.5)
nRBC: 0 % (ref 0.0–0.2)

## 2022-10-15 LAB — RESP PANEL BY RT-PCR (RSV, FLU A&B, COVID)  RVPGX2
Influenza A by PCR: NEGATIVE
Influenza B by PCR: NEGATIVE
Resp Syncytial Virus by PCR: NEGATIVE
SARS Coronavirus 2 by RT PCR: NEGATIVE

## 2022-10-15 MED ORDER — LIDOCAINE HCL (PF) 1 % IJ SOLN
10.0000 mL | Freq: Once | INTRAMUSCULAR | Status: AC
  Administered 2022-10-15: 10 mL via INTRADERMAL
  Filled 2022-10-15: qty 10

## 2022-10-15 NOTE — ED Notes (Signed)
Patient's right hand cleansed with NS, non-adherant pad and kerlix wrapped

## 2022-10-15 NOTE — ED Triage Notes (Signed)
Pt BIB [facility] from home c/o AMS. Pt has a hx of dementia but per his wife he is not acting normal. Per EMS pt is alert and oriented x3.

## 2022-10-15 NOTE — ED Provider Notes (Signed)
[facility] EMERGENCY DEPARTMENT Provider Note   CSN: [id number] Arrival date & time: 10/15/22  0636     History  Chief Complaint  Patient presents with   Altered Mental Status    RUGER SAXER is a 77 y.o. male w/ hx of dementia presenting to ED by EMS with AMS. Patient is level 5 caveat due to dementia.  Hx provided by family and EMS  I spoke to his wife Bond Grieshop by phone who reports, patient has been weak, in decline, not wanting to eat, and sleeping a lot. At baseline he doesn't walk because he's too unsteady on his feet.  He fell this morning, unwitnessed, and has a cut on his hand.  HPI     Home Medications Prior to Admission medications   Medication Sig Start Date End Date Taking? Authorizing Provider  albuterol (VENTOLIN HFA) 108 (90 Base) MCG/ACT inhaler INHALE 1 TO 2 PUFFS INTO THE LUNGS EVERY 4 HOURS AS NEEDED FOR WHEEZING OR SHORTNESS OF BREATH 09/21/22   Johney Maine, MD  ANORO ELLIPTA 62.5-25 MCG/INH AEPB INHALE 1 PUFF INTO THE LUNGS DAILY 04/23/19   Mannam, Colbert Coyer, MD  aspirin 81 MG tablet Take 81 mg by mouth daily.    [provider]  atorvastatin (LIPITOR) 40 MG tablet Take 40 mg by mouth daily.  10/02/13   [provider]  benazepril (LOTENSIN) 20 MG tablet Take 20 mg by mouth daily.  05/29/18   [provider]  diphenhydrAMINE (BENADRYL) 12.5 MG/5ML elixir Take 12.5 mg by mouth daily as needed for allergies.     [provider]  folic acid (FOLVITE) 400 MCG tablet Take 400 mcg by mouth daily.    [provider]  furosemide (LASIX) 40 MG tablet Take 40 mg by mouth daily.     [provider]  HYDROcodone-acetaminophen (NORCO) 5-325 MG tablet Take 1/2 to 1 tablet by mouth  4 times daily as needed for pain. 09/14/22   Briant Cedar, PA-C  ibuprofen (ADVIL,MOTRIN) 200 MG tablet Take 400 mg by mouth every 6 (six) hours as needed for headache, mild pain or moderate pain.    [provider]  levothyroxine (SYNTHROID) 25 MCG tablet TAKE 1 TABLET BY MOUTH DAILY BEFORE BREAKFAST. Patient taking differently: 75 mcg. 06/01/19   Johney Maine, MD  metFORMIN (GLUCOPHAGE) 500 MG tablet Take 500 mg by mouth every morning.    [provider]  metoprolol succinate (TOPROL-XL) 25 MG 24 hr tablet TAKE 1 TABLET BY MOUTH DAILY. PLEASE MAKE OVERDUE APPT WITH DR. BEFORE ANYMORE REFILLS 12/19/20   Tereso Newcomer T, PA-C  potassium chloride SA (K-DUR) 20 MEQ tablet Take 20 mEq by mouth daily.    [provider]  prochlorperazine (COMPAZINE) 10 MG tablet Take 10 mg by mouth every 6 (six) hours as needed for nausea or vomiting.  09/25/18   [provider]  UNABLE TO FIND Take by mouth. 05/18/19   [provider]      Allergies    Neomycin-bacitracin zn-polymyx    Review of Systems   Review of Systems  Physical Exam Updated Vital Signs BP (!) 149/96   Pulse 87   Temp 97.7 F (36.5 C) (Oral)   Resp 17   Ht 5\' 10"  (1.778 m)   SpO2 99%   BMI 32.34 kg/m  Physical Exam Constitutional:      General: He is not in acute distress. HENT:     Head: Normocephalic and  atraumatic.  Eyes:     Conjunctiva/sclera: Conjunctivae normal.     Pupils: Pupils are equal, round, and reactive to light.  Cardiovascular:     Rate and Rhythm: Normal rate and regular rhythm.     Pulses: Normal pulses.  Pulmonary:     Effort: Pulmonary effort is normal. No respiratory distress.  Abdominal:     General: There is no distension.     Tenderness: There is no abdominal tenderness.  Skin:    General: Skin is warm and dry.     Comments: 5 cm linear laceration, superficial, across back of right hand  Neurological:     General: No focal deficit present.     Mental Status: He is alert. Mental status is at baseline.  Psychiatric:        Mood and Affect: Mood normal.        Behavior: Behavior normal.     ED Results / Procedures / Treatments   Labs (all labs  ordered are listed, but only abnormal results are displayed) Labs Reviewed  COMPREHENSIVE METABOLIC PANEL - Abnormal; Notable for the following components:      Result Value   Potassium 3.4 (*)    Glucose, Bld 118 (*)    Calcium 8.8 (*)    Albumin 3.4 (*)    All other components within normal limits  RESP PANEL BY RT-PCR (RSV, FLU A&B, COVID)  RVPGX2  CBC  URINALYSIS, ROUTINE W REFLEX MICROSCOPIC  CBG MONITORING, ED    EKG None  Radiology CT Head Wo Contrast  Result Date: 10/15/2022 CLINICAL DATA:  Altered mental status. EXAM: CT HEAD WITHOUT CONTRAST TECHNIQUE: Contiguous axial images were obtained from the base of the skull through the vertex without intravenous contrast. RADIATION DOSE REDUCTION: This exam was performed according to the departmental dose-optimization program which includes automated exposure control, adjustment of the mA and/or kV according to patient size and/or use of iterative reconstruction technique. COMPARISON:  12/31/2019 FINDINGS: Brain: No evidence of acute infarction, hemorrhage, hydrocephalus, extra-axial collection or mass lesion/mass effect. There is mild diffuse low-attenuation within the subcortical and periventricular white matter compatible with chronic microvascular disease. Prominence of the sulci and ventricles compatible with brain atrophy. Vascular: No hyperdense vessel or unexpected calcification. Skull: Normal. Negative for fracture or focal lesion. Sinuses/Orbits: No acute finding. Other: None IMPRESSION: 1. No acute intracranial abnormalities. 2. Chronic small vessel ischemic disease and brain atrophy. Electronically Signed   By: Signa Kell M.D.   On: 10/15/2022 09:11    Procedures .Marland KitchenLaceration Repair  Date/Time: 10/15/2022 1:35 PM  Performed by: Terald Sleeper, MD Authorized by: Terald Sleeper, MD   Consent:    Consent obtained:  Verbal   Consent given by:  Patient and guardian   Risks discussed:  Pain, poor cosmetic result and  poor wound healing Universal protocol:    Procedure explained and questions answered to patient or proxy's satisfaction: yes     Site/side marked: yes     Immediately prior to procedure, a time out was called: yes     Patient identity confirmed:  Arm band Anesthesia:    Anesthesia method:  Topical application and local infiltration   Topical anesthetic:  LET   Local anesthetic:  Lidocaine 1% w/o epi Laceration details:    Location:  Hand   Hand location:  R hand, dorsum   Length (cm):  5   Depth (mm):  3 Pre-procedure details:    Preparation:  Patient was prepped and draped in  usual sterile fashion Treatment:    Area cleansed with:  Saline   Amount of cleaning:  Standard   Irrigation solution:  Sterile water Skin repair:    Repair method:  Sutures   Suture size:  4-0   Suture material:  Prolene   Suture technique:  Simple interrupted   Number of sutures:  5 Approximation:    Approximation:  Close Repair type:    Repair type:  Intermediate Post-procedure details:    Dressing:  Non-adherent dressing   Procedure completion:  Tolerated well, no immediate complications     Medications Ordered in ED Medications  lidocaine (PF) (XYLOCAINE) 1 % injection 10 mL (10 mLs Intradermal Given by Other 10/15/22 1150)    ED Course/ Medical Decision Making/ A&P Clinical Course as of 10/15/22 1337  Mon Oct 15, 2022  1332 Laceration repaired.  Patient's son and daughter at the bedside and explained his workup today.  I encouraged him to seek a neurology evaluation as I suspect the patient is experiencing cognitive decline from dementia, and they verbalized understanding [MT]    Clinical Course User Index [MT] Jamarious Febo, Kermit Balo, MD                             Medical Decision Making Amount and/or Complexity of Data Reviewed Labs: ordered. Radiology: ordered.  Risk Prescription drug management.   This patient presents to the Emergency Department with complaint of altered mental  status.  This involves an extensive number of treatment options, and is a complaint that carries with it a high risk of complications and morbidity.  The differential diagnosis includes hypoglycemia vs metabolic encephalopathy vs infection (including cystitis) vs ICH vs stroke vs polypharmacy vs other  Per the history provided by his wife on the phone, it is not clear exactly the timing of his decline, this may be more of a progressive condition related to his dementia as she reports "he is just tired and sleeping all day he does not want to eat much or drink much".  I ordered, reviewed, and interpreted labs, including labs appear to be unremarkable, no evidence of infection UA.  Mild hyperglycemia.  No significant electrolyte derangement or anemia I ordered medication lidocaine for laceration repair I ordered imaging studies which included CT scan of the head I independently visualized and interpreted imaging which showed no acute emergent findings and the monitor tracing which showed no dangerous arrhythmias Additional history was obtained from EMS, patient's wife by phone, son and daughter at bedside I personally reviewed the patients ECG which showed sinus rhythm with no acute ischemic findings    After the interventions stated above, I reevaluated the patient and found he remained stable for discharge         Final Clinical Impression(s) / ED Diagnoses Final diagnoses:  Laceration of right hand without foreign body, initial encounter  Fall, initial encounter  Balance problem    Rx / DC Orders ED Discharge Orders          Ordered    Ambulatory referral to Neurology       Comments: An appointment is requested in approximately: 2 weeks Suspected progressing dementia   10/15/22 1333              Terald Sleeper, MD 10/15/22 [phone]

## 2022-10-15 NOTE — Discharge Instructions (Signed)
The hand stitches should be kept dry and clean for the next 2 days.  You can leave the hand wrapped for the next 48 hours.  After that you can take down the wrapping if you choose, or else leave it in place.   The stitches that were placed in the back of the right hand need to be removed in 7 days.  This can be done in an urgent care, doctor's office, or the emergency department.  Removal date is 10/22/22.  I recommend scheduling an appointment with a neurologist to be seen in the office for dementia.  There are many different forms of dementia and memory loss and balance problems, and these are generally worked up by a neurologist.

## 2022-10-31 ENCOUNTER — Other Ambulatory Visit: Payer: Self-pay

## 2022-10-31 DIAGNOSIS — C3490 Malignant neoplasm of unspecified part of unspecified bronchus or lung: Secondary | ICD-10-CM

## 2022-11-02 MED ORDER — HYDROCODONE-ACETAMINOPHEN 5-325 MG PO TABS: ORAL_TABLET | ORAL | 0 refills | Status: DC

## 2022-11-05 ENCOUNTER — Encounter: Payer: Self-pay | Admitting: Podiatry

## 2022-11-05 ENCOUNTER — Ambulatory Visit (INDEPENDENT_AMBULATORY_CARE_PROVIDER_SITE_OTHER): Payer: Medicare HMO | Admitting: Podiatry

## 2022-11-05 DIAGNOSIS — M79676 Pain in unspecified toe(s): Secondary | ICD-10-CM | POA: Diagnosis not present

## 2022-11-05 DIAGNOSIS — E119 Type 2 diabetes mellitus without complications: Secondary | ICD-10-CM | POA: Diagnosis not present

## 2022-11-05 DIAGNOSIS — B351 Tinea unguium: Secondary | ICD-10-CM

## 2022-11-05 NOTE — Progress Notes (Signed)
This patient returns to my office for at risk foot care.  This patient requires this care by a professional since this patient will be at risk due to having type 2 diabetes. He presents to the office with his wife.  This patient is unable to cut nails himself since the patient cannot reach his nails.These nails are painful walking and wearing shoes.  This patient presents for at risk foot care today. Patient has not been seen in over 11 months.  He presently is in a wheelchair accompanied by his wife and granddaughter.  General Appearance  Alert, conversant and in no acute stress.  Vascular  Dorsalis pedis and posterior tibial  pulses are weakly  palpable  bilaterally.  Capillary return is within normal limits  bilaterally. Temperature is within normal limits  bilaterally.  Neurologic  Senn-Weinstein monofilament wire test within normal limits  bilaterally. Muscle power within normal limits bilaterally.  Nails Thick disfigured discolored nails with subungual debris  from hallux to fifth toes bilaterally. No evidence of bacterial infection or drainage bilaterally.  Orthopedic  No limitations of motion  feet .  No crepitus or effusions noted.  No bony pathology or digital deformities noted.  Skin  normotropic skin with no porokeratosis noted bilaterally.  No signs of infections or ulcers noted.     Onychomycosis  Pain in right toes  Pain in left toes  Consent was obtained for treatment procedures.   Mechanical debridement of nails 1-5  bilaterally performed with a nail nipper.  Filed with dremel without incident.    Return office visit    4 months                Told patient to return for periodic foot care and evaluation due to potential at risk complications.   Gardiner Barefoot DPM

## 2022-12-03 ENCOUNTER — Encounter: Payer: Self-pay | Admitting: Neurology

## 2022-12-03 ENCOUNTER — Ambulatory Visit (INDEPENDENT_AMBULATORY_CARE_PROVIDER_SITE_OTHER): Payer: Medicare HMO | Admitting: Neurology

## 2022-12-03 VITALS — BP 111/79 | HR 89 | Ht 70.0 in

## 2022-12-03 DIAGNOSIS — F02B18 Dementia in other diseases classified elsewhere, moderate, with other behavioral disturbance: Secondary | ICD-10-CM

## 2022-12-03 DIAGNOSIS — G301 Alzheimer's disease with late onset: Secondary | ICD-10-CM

## 2022-12-03 MED ORDER — MEMANTINE HCL 10 MG PO TABS: 10.0000 mg | ORAL_TABLET | Freq: Two times a day (BID) | ORAL | 11 refills | Status: AC

## 2022-12-03 MED ORDER — DONEPEZIL HCL 5 MG PO TABS: 5.0000 mg | ORAL_TABLET | Freq: Every day | ORAL | 11 refills | Status: DC

## 2022-12-03 NOTE — Patient Instructions (Signed)
Start Aricept 5 mg nightly Start Namenda 10 mg twice daily Continue other medication Encourage physical therapy Continue to follow with PCP Return in 1 year or sooner if worse.   There are well-accepted and sensible ways to reduce risk for Alzheimers disease and other degenerative brain disorders .  Exercise Daily Walk A daily 20 minute walk should be part of your routine. Disease related apathy can be a significant roadblock to exercise and the only way to overcome this is to make it a daily routine and perhaps have a reward at the end (something your loved one loves to eat or drink perhaps) or a personal trainer coming to the home can also be very useful. Most importantly, the patient is much more likely to exercise if the caregiver / spouse does it with him/her. In general a structured, repetitive schedule is best.  General Health: Any diseases which effect your body will effect your brain such as a pneumonia, urinary infection, blood clot, heart attack or stroke. Keep contact with your primary care doctor for regular follow ups.  Sleep. A good nights sleep is healthy for the brain. Seven hours is recommended. If you have insomnia or poor sleep habits we can give you some instructions. If you have sleep apnea wear your mask.  Diet: Eating a heart healthy diet is also a good idea; fish and poultry instead of red meat, nuts (mostly non-peanuts), vegetables, fruits, olive oil or canola oil (instead of butter), minimal salt (use other spices to flavor foods), whole grain rice, bread, cereal and pasta and wine in moderation.Research is now showing that the MIND diet, which is a combination of The Mediterranean diet and the DASH diet, is beneficial for cognitive processing and longevity. Information about this diet can be found in The MIND Diet, a book by Doyne Keel, MS, RDN, and online at [employer]  Finances, Power of Attorney and Advance Directives: You should  consider putting legal safeguards in place with regard to financial and medical decision making. While the spouse always has power of attorney for medical and financial issues in the absence of any form, you should consider what you want in case the spouse / caregiver is no longer around or capable of making decisions.

## 2022-12-03 NOTE — Progress Notes (Signed)
[facility]  PATIENT: Barry Booker DOB: April 17, 1946  REQUESTING CLINICIAN: Wyvonnia Dusky, MD HISTORY FROM: Patient, but mainly daughter and spouse  REASON FOR VISIT: Dementia    HISTORICAL  CHIEF COMPLAINT:  Chief Complaint  Patient presents with   New Patient (Initial Visit)    Rm 17. Accompanied by wife and daughter. NP/internal ED referral for dementia.    HISTORY OF PRESENT ILLNESS:  This is 77 year old gentleman with past medical history of chronic alcoholism, hypertension hyperlipidemia, diabetes mellitus, COPD, history of lung cancer who is presenting with memory problem.  Unclear if patient already have a diagnosis of dementia.  History mainly obtained by wife Mardene Celeste and Chrys Racer her daughter.  They report for the past 5 years, the patient has worsening memory, worse in the past 2 years.  Lately he has not been doing much he will sit all day and wife has to do everything for him.  He is not cooking, cleaning, he will need help using the bathroom showering, putting his clothes.  He does not manage his own finances.  He is not active and does not participate in physical therapy when they do come to the house.  He is very forgetful, and they also reported at times he can get irritable.  And agitated.  Most of the time his symptoms is still on his chair and not interactive.   Functional status: Dependent in all ADLs and IADLs Patient lives with spouse and daughter. Cooking: no Cleaning: no Shopping: no Bathing: no, needs help Toileting: no, needs help Driving: no Bills: no Medications: no, someone has to help him take his meds Ever left the stove on by accident?: n/a Forget how to use items around the house?: yes Getting lost going to familiar places?: yes Forgetting loved ones names?: yes Word finding difficulty? yes Sleep: ok   OTHER MEDICAL CONDITIONS: Chronic alcoholism, Hypertension, diabetes, COPD, history of lung cancer.    REVIEW  OF SYSTEMS: Full 14 system review of systems performed and negative with exception of: unable to fully obtain due to mental status   ALLERGIES: Allergies  Allergen Reactions   Neomycin-Bacitracin Zn-Polymyx Rash    HOME MEDICATIONS: Outpatient Medications Prior to Visit  Medication Sig Dispense Refill   albuterol (VENTOLIN HFA) 108 (90 Base) MCG/ACT inhaler INHALE 1 TO 2 PUFFS INTO THE LUNGS EVERY 4 HOURS AS NEEDED FOR WHEEZING OR SHORTNESS OF BREATH 18 each 2   ANORO ELLIPTA 62.5-25 MCG/INH AEPB INHALE 1 PUFF INTO THE LUNGS DAILY 60 each 1   aspirin 81 MG tablet Take 81 mg by mouth daily.     atorvastatin (LIPITOR) 40 MG tablet Take 40 mg by mouth daily.      benazepril (LOTENSIN) 20 MG tablet Take 20 mg by mouth daily.      diphenhydrAMINE (BENADRYL) 12.5 MG/5ML elixir Take 12.5 mg by mouth daily as needed for allergies.      folic acid (FOLVITE) A999333 MCG tablet Take 400 mcg by mouth daily.     furosemide (LASIX) 40 MG tablet Take 40 mg by mouth daily.      HYDROcodone-acetaminophen (NORCO) 5-325 MG tablet Take 1/2 to 1 tablet by mouth  4 times daily as needed for pain. 60 tablet 0   ibuprofen (ADVIL,MOTRIN) 200 MG tablet Take 400 mg by mouth every 6 (six) hours as needed for headache, mild pain or moderate pain.     levothyroxine (SYNTHROID) 25 MCG tablet TAKE 1 TABLET BY MOUTH DAILY BEFORE BREAKFAST. (Patient  taking differently: 75 mcg.) 90 tablet 1   metFORMIN (GLUCOPHAGE) 500 MG tablet Take 500 mg by mouth every morning.     metoprolol succinate (TOPROL-XL) 25 MG 24 hr tablet TAKE 1 TABLET BY MOUTH DAILY. PLEASE MAKE OVERDUE APPT WITH DR. BEFORE ANYMORE REFILLS 30 tablet 0   potassium chloride SA (K-DUR) 20 MEQ tablet Take 20 mEq by mouth daily.     prochlorperazine (COMPAZINE) 10 MG tablet Take 10 mg by mouth every 6 (six) hours as needed for nausea or vomiting.      UNABLE TO FIND Take by mouth.     No facility-administered medications prior to visit.    PAST MEDICAL  HISTORY: Past Medical History:  Diagnosis Date   Alcohol abuse    COPD (chronic obstructive pulmonary disease) ([place])    Diabetes mellitus without complication (HCC)    Echocardiogram    Echo 11/2018: EF 45-50, mild MAC, mild calc of AoV (difficult study) >> Limited echo with Definity contrast 11/2018: EF 50-55   Gout    High cholesterol    Hypertension    Malignant neoplasm of right upper lobe of lung ([place]) 03/29/2018    PAST SURGICAL HISTORY: Past Surgical History:  Procedure Laterality Date   COLONOSCOPY     FLEXIBLE BRONCHOSCOPY [name] 03/21/2018   Procedure: FLEXIBLE BRONCHOSCOPY;  Surgeon: Marshell Garfinkel, MD;  Location: [facility];  Service: Pulmonary;  Laterality: [name];   NOSE SURGERY  90's   VIDEO BRONCHOSCOPY WITH ENDOBRONCHIAL ULTRASOUND [name] 03/21/2018   Procedure: VIDEO BRONCHOSCOPY WITH ENDOBRONCHIAL ULTRASOUND;  Surgeon: Marshell Garfinkel, MD;  Location: [facility];  Service: Pulmonary;  Laterality: [name];    FAMILY HISTORY: Family History  Problem Relation Age of Onset   Arthritis Mother    Cancer Father    Cancer Sister    Heart disease Maternal Uncle    Colon cancer Neg Hx     SOCIAL HISTORY: Social History   Socioeconomic History   Marital status: Married    Spouse name: Not on file   Number of children: Not on file   Years of education: Not on file   Highest education level: Not on file  Occupational History   Not on file  Tobacco Use   Smoking status: Former    Packs/day: 0.25    Years: 50.00    Total pack years: 12.50    Types: Cigarettes    Quit date: 07/2017    Years since quitting: 5.4   Smokeless tobacco: Never   Tobacco comments:    quit smoking 07/2017  Vaping Use   Vaping Use: Never used  Substance and Sexual Activity   Alcohol use: Yes    Alcohol/week: 14.0 - 21.0 standard drinks of alcohol    Types: 14 - 21 Cans of beer per week   Drug use: No   Sexual activity: Not on file  Other Topics Concern   Not on file  Social History Narrative   Not on  file   Social Determinants of Health   Financial Resource Strain: Not on file  Food Insecurity: Not on file  Transportation Needs: Not on file  Physical Activity: Not on file  Stress: Not on file  Social Connections: Not on file  Intimate Partner Violence: Not on file     PHYSICAL EXAM  GENERAL EXAM/CONSTITUTIONAL: Vitals:  Vitals:   12/03/22 1048  BP: 111/79  Pulse: 89  Height: '5\' 10"'$  (1.778 m)   Body mass index is 32.34 kg/m. Wt Readings from Last 3  Encounters:  05/26/21 225 lb 6.4 oz (102.2 kg)  10/10/20 241 lb 1.6 oz (109.4 kg)  05/19/20 241 lb 1.6 oz (109.4 kg)   Patient is in no distress; well developed, nourished and groomed; neck is supple  EYES: Visual fields full to confrontation, Extraocular movements intacts,   MUSCULOSKELETAL: Gait, strength, tone, movements noted in Neurologic exam below  NEUROLOGIC: MENTAL STATUS:      No data to display            12/03/2022   10:49 AM  Montreal Cognitive Assessment   Visuospatial/ Executive (0/5) 0  Naming (0/3) 3  Attention: Read list of digits (0/2) 2  Attention: Read list of letters (0/1) 0  Attention: Serial 7 subtraction starting at 100 (0/3) 1  Language: Repeat phrase (0/2) 1  Language : Fluency (0/1) 0  Abstraction (0/2) 0  Delayed Recall (0/5) 0  Orientation (0/6) 1  Total 8  Adjusted Score (based on education) 9    CRANIAL NERVE:  2nd, 3rd, 4th, 6th- visual fields full to confrontation, extraocular muscles intact, no nystagmus 5th - facial sensation symmetric 7th - facial strength symmetric 8th - hearing intact 9th - palate elevates symmetrically, uvula midline 11th - shoulder shrug symmetric 12th - tongue protrusion midline  MOTOR:  normal bulk and tone, at least antigravity in the upper and lower extremities  GAIT/STATION:  Deferred    DIAGNOSTIC DATA (LABS, IMAGING, TESTING) - I reviewed patient records, labs, notes, testing and imaging myself where available.  Lab Results   Component Value Date   WBC 9.5 10/15/2022   HGB 14.7 10/15/2022   HCT 48.6 10/15/2022   MCV 89.8 10/15/2022   PLT 160 10/15/2022      Component Value Date/Time   NA 139 10/15/2022 0647   K 3.4 (L) 10/15/2022 0647   CL 104 10/15/2022 0647   CO2 26 10/15/2022 0647   GLUCOSE 118 (H) 10/15/2022 0647   BUN 12 10/15/2022 0647   CREATININE 0.95 10/15/2022 0647   CREATININE 0.94 11/10/2021 1303   CREATININE 0.88 02/09/2016 1014   CALCIUM 8.8 (L) 10/15/2022 0647   PROT 8.1 10/15/2022 0647   ALBUMIN 3.4 (L) 10/15/2022 0647   AST 17 10/15/2022 0647   AST 14 (L) 11/10/2021 1303   ALT 12 10/15/2022 0647   ALT 10 11/10/2021 1303   ALKPHOS 62 10/15/2022 0647   BILITOT 0.4 10/15/2022 0647   BILITOT 0.5 11/10/2021 1303   GFRNONAA >60 10/15/2022 0647   GFRNONAA >60 11/10/2021 1303   GFRAA >60 05/19/2020 1000   Lab Results  Component Value Date   CHOL 223 (HH) 09/21/2008   HDL 46.0 09/21/2008   LDLDIRECT 167.8 09/21/2008   TRIG 62 09/21/2008   CHOLHDL 4.8 CALC 09/21/2008   No results found for: "HGBA1C" Lab Results  Component Value Date   VITAMINB12 548 06/05/2007   Lab Results  Component Value Date   TSH 2.651 05/26/2021    CT head 12/03/2022 1. No acute intracranial abnormalities. 2. Chronic small vessel ischemic disease and brain atrophy.    ASSESSMENT AND PLAN  77 y.o. year old male with history of chronic alcoholism, hypertension, hyperlipidemia, diabetes mellitus, history of lung cancer, and COPD who is presenting with complaint of memory.  Per family, memory problem has been going on for the past 5 years and worse in the last 2.  They also reports patient is completely dependent on his wife.  He does not participate in physical therapy and sit in his wheelchair  most of the day.  Due to physical deconditioning, he also had falls recently.  Again PT was sent to his house but he is not participatory.  On exam today, he scored a 9 out of 30 on the [place] indicative of severe  impairment.  Patient likely has Alzheimer dementia and his history of chronic alcoholism also contributed to his memory decline.  Review of his head CT show very advanced atrophy.  For now I will start the patient on Aricept 5 mg nightly and Namenda 10 mg twice daily.  We discussed side effect of the medication including diarrhea, vivid dreams and somnolence.  Advised him to continue follow-up with PCP, to encourage patient to participate in physical therapy and to return in 1 year or sooner if worse.  They voiced understanding.   1. 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   There are well-accepted and sensible ways to reduce risk for Alzheimers disease and other degenerative brain disorders .  Exercise Daily Walk A daily 20 minute walk should be part of your routine. Disease related apathy can be a significant roadblock to exercise and the only way to overcome this is to make it a daily routine and perhaps have a reward at the end (something your loved one loves to eat or drink perhaps) or a personal trainer coming to the home can also be very useful. Most importantly, the patient is much more likely to exercise if the caregiver / spouse does it with him/her. In general a structured, repetitive schedule is best.  General Health: Any diseases which effect your body will effect your brain such as a pneumonia, urinary infection, blood clot, heart attack or stroke. Keep contact with your primary care doctor for regular follow ups.  Sleep. A good nights sleep is healthy for the brain. Seven hours is recommended. If you have insomnia or poor sleep habits we can give you some instructions. If you have sleep apnea wear your mask.  Diet: Eating a heart healthy  diet is also a good idea; fish and poultry instead of red meat, nuts (mostly non-peanuts), vegetables, fruits, olive oil or canola oil (instead of butter), minimal salt (use other spices to flavor foods), whole grain rice, bread, cereal and pasta and wine in moderation.Research is now showing that the MIND diet, which is a combination of The Mediterranean diet and the DASH diet, is beneficial for cognitive processing and longevity. Information about this diet can be found in The MIND Diet, a book by Doyne Keel, MS, RDN, and online at [employer].si  Finances, Power of Attorney and Advance Directives: You should consider putting legal safeguards in place with regard to financial and medical decision making. While the spouse always has power of attorney for medical and financial issues in the absence of any form, you should consider what you want in case the spouse / caregiver is no longer around or capable of making decisions.   No orders of the defined types were placed in this encounter.   Meds ordered this encounter  Medications   memantine (NAMENDA) 10 MG tablet    Sig: Take 1 tablet (10 mg total) by mouth 2 (two) times daily.    Dispense:  60 tablet    Refill:  11  donepezil (ARICEPT) 5 MG tablet    Sig: Take 1 tablet (5 mg total) by mouth at bedtime.    Dispense:  30 tablet    Refill:  11    Return in about 1 year (around 12/03/2023).    Alric Ran, MD 12/03/2022, 6:16 PM  [facility] [address] [place], [place] [address] [phone]

## 2022-12-07 ENCOUNTER — Other Ambulatory Visit: Payer: Self-pay | Admitting: Hematology

## 2022-12-07 DIAGNOSIS — C3490 Malignant neoplasm of unspecified part of unspecified bronchus or lung: Secondary | ICD-10-CM

## 2022-12-14 ENCOUNTER — Other Ambulatory Visit: Payer: Self-pay | Admitting: *Deleted

## 2022-12-14 DIAGNOSIS — C3490 Malignant neoplasm of unspecified part of unspecified bronchus or lung: Secondary | ICD-10-CM

## 2022-12-18 MED ORDER — HYDROCODONE-ACETAMINOPHEN 5-325 MG PO TABS: ORAL_TABLET | ORAL | 0 refills | Status: DC

## 2023-01-21 ENCOUNTER — Other Ambulatory Visit: Payer: Self-pay

## 2023-01-21 DIAGNOSIS — C3491 Malignant neoplasm of unspecified part of right bronchus or lung: Secondary | ICD-10-CM

## 2023-01-22 ENCOUNTER — Other Ambulatory Visit: Payer: Self-pay

## 2023-01-22 ENCOUNTER — Inpatient Hospital Stay: Payer: Medicare HMO | Attending: Hematology

## 2023-01-22 ENCOUNTER — Inpatient Hospital Stay (HOSPITAL_BASED_OUTPATIENT_CLINIC_OR_DEPARTMENT_OTHER): Payer: Medicare HMO | Admitting: Hematology

## 2023-01-22 VITALS — BP 116/81 | HR 95 | Temp 97.3°F | Resp 18

## 2023-01-22 DIAGNOSIS — Z85118 Personal history of other malignant neoplasm of bronchus and lung: Secondary | ICD-10-CM | POA: Diagnosis present

## 2023-01-22 DIAGNOSIS — C3490 Malignant neoplasm of unspecified part of unspecified bronchus or lung: Secondary | ICD-10-CM

## 2023-01-22 DIAGNOSIS — Z08 Encounter for follow-up examination after completed treatment for malignant neoplasm: Secondary | ICD-10-CM | POA: Insufficient documentation

## 2023-01-22 DIAGNOSIS — E039 Hypothyroidism, unspecified: Secondary | ICD-10-CM | POA: Insufficient documentation

## 2023-01-22 DIAGNOSIS — C3491 Malignant neoplasm of unspecified part of right bronchus or lung: Secondary | ICD-10-CM | POA: Diagnosis not present

## 2023-01-22 DIAGNOSIS — Z7189 Other specified counseling: Secondary | ICD-10-CM

## 2023-01-22 LAB — CMP (CANCER CENTER ONLY)
ALT: 6 U/L (ref 0–44)
AST: 12 U/L — ABNORMAL LOW (ref 15–41)
Albumin: 3.5 g/dL (ref 3.5–5.0)
Alkaline Phosphatase: 62 U/L (ref 38–126)
Anion gap: 9 (ref 5–15)
BUN: 12 mg/dL (ref 8–23)
CO2: 27 mmol/L (ref 22–32)
Calcium: 9.2 mg/dL (ref 8.9–10.3)
Chloride: 107 mmol/L (ref 98–111)
Creatinine: 1.03 mg/dL (ref 0.61–1.24)
GFR, Estimated: >60 mL/min (ref >60)
Glucose, Bld: 84 mg/dL (ref 70–99)
Potassium: 3.9 mmol/L (ref 3.5–5.1)
Sodium: 143 mmol/L (ref 135–145)
Total Bilirubin: 0.6 mg/dL (ref 0.3–1.2)
Total Protein: 7.6 g/dL (ref 6.5–8.1)

## 2023-01-22 LAB — CBC WITH DIFFERENTIAL (CANCER CENTER ONLY)
Abs Immature Granulocytes: 0.03 10*3/uL (ref 0.00–0.07)
Basophils Absolute: 0 10*3/uL (ref 0.0–0.1)
Basophils Relative: 0 %
Eosinophils Absolute: 0.2 10*3/uL (ref 0.0–0.5)
Eosinophils Relative: 3 %
HCT: 43.3 % (ref 39.0–52.0)
Hemoglobin: 13.7 g/dL (ref 13.0–17.0)
Immature Granulocytes: 0 %
Lymphocytes Relative: 9 %
Lymphs Abs: 0.7 10*3/uL (ref 0.7–4.0)
MCH: 27.5 pg (ref 26.0–34.0)
MCHC: 31.6 g/dL (ref 30.0–36.0)
MCV: 86.8 fL (ref 80.0–100.0)
Monocytes Absolute: 0.6 10*3/uL (ref 0.1–1.0)
Monocytes Relative: 7 %
Neutro Abs: 6.4 10*3/uL (ref 1.7–7.7)
Neutrophils Relative %: 81 %
Platelet Count: 156 10*3/uL (ref 150–400)
RBC: 4.99 MIL/uL (ref 4.22–5.81)
RDW: 15.8 % — ABNORMAL HIGH (ref 11.5–15.5)
WBC Count: 8 10*3/uL (ref 4.0–10.5)
nRBC: 0 % (ref 0.0–0.2)

## 2023-01-22 MED ORDER — HYDROCODONE-ACETAMINOPHEN 5-325 MG PO TABS: ORAL_TABLET | ORAL | 0 refills | Status: DC

## 2023-01-22 NOTE — Progress Notes (Signed)
HEMATOLOGY/ONCOLOGY CLINIC NOTE  Date of Service: 01/22/23  Patient Care Team: Tracey Harries, MD as PCP - General (Family Medicine) Tonny Bollman, MD as PCP - Cardiology (Cardiology)  CHIEF COMPLAINTS/PURPOSE OF CONSULTATION:  Follow-up for continued evaluation and management of lung cancer  HISTORY OF PRESENTING ILLNESS:  Please see previous notes for details on initial presentation.  Interval History:   Barry Booker returns to clinic today with his daughter and wife for continued follow-up of his squamous cell carcinoma of the lung.  He was last seen by me on 07/23/22.  Today, he states that he is frustrated due to his functional limitations.  He is unable to perform any of his ADLs without assistance.  He has been unable to get around and do activities he would like to do.  He has had many falls when he gets restless and tries to get up and walk on his own. He was seen by neurology and diagnosed with dementia. He is accompanied by his daughter and wife who are his primary caregivers.  His daughter states that home health shows up very irregularly and may show up once a week for x2-3 weeks and then stop showing up.  He remains uninterested in moving to an assisted living facility. Patient complains of severe discomfort on his buttocks due to early stage pressure ulcers- patient's wife and daughter reports red/purple discoloration. They have tried OTC solutions without any improvement. He is eating small amounts of food and can only eat foods with his hands now. He is not smoking or drinking alcohol.   MEDICAL HISTORY:  Past Medical History:  Diagnosis Date   Alcohol abuse    COPD (chronic obstructive pulmonary disease) (HCC)    Diabetes mellitus without complication (HCC)    Echocardiogram    Echo 11/2018: EF 45-50, mild MAC, mild calc of AoV (difficult study) >> Limited echo with Definity contrast 11/2018: EF 50-55   Gout    High cholesterol    Hypertension     Malignant neoplasm of right upper lobe of lung (HCC) 03/29/2018    SURGICAL HISTORY: Past Surgical History:  Procedure Laterality Date   COLONOSCOPY     FLEXIBLE BRONCHOSCOPY [name] 03/21/2018   Procedure: FLEXIBLE BRONCHOSCOPY;  Surgeon: Chilton Greathouse, MD;  Location: [facility] OR;  Service: Pulmonary;  Laterality: [name];   NOSE SURGERY  90's   VIDEO BRONCHOSCOPY WITH ENDOBRONCHIAL ULTRASOUND [name] 03/21/2018   Procedure: VIDEO BRONCHOSCOPY WITH ENDOBRONCHIAL ULTRASOUND;  Surgeon: Chilton Greathouse, MD;  Location: [facility] OR;  Service: Pulmonary;  Laterality: [name];    SOCIAL HISTORY: Social History   Socioeconomic History   Marital status: Married    Spouse name: Not on file   Number of children: Not on file   Years of education: Not on file   Highest education level: Not on file  Occupational History   Not on file  Tobacco Use   Smoking status: Former    Packs/day: 0.25    Years: 50.00    Additional pack years: 0.00    Total pack years: 12.50    Types: Cigarettes    Quit date: 07/2017    Years since quitting: 5.5   Smokeless tobacco: Never   Tobacco comments:    quit smoking 07/2017  Vaping Use   Vaping Use: Never used  Substance and Sexual Activity   Alcohol use: Yes    Alcohol/week: 14.0 - 21.0 standard drinks of alcohol    Types: 14 - 21 Cans of beer per  week   Drug use: No   Sexual activity: Not on file  Other Topics Concern   Not on file  Social History Narrative   Not on file   Social Determinants of Health   Financial Resource Strain: Not on file  Food Insecurity: Not on file  Transportation Needs: Not on file  Physical Activity: Not on file  Stress: Not on file  Social Connections: Not on file  Intimate Partner Violence: Not on file    FAMILY HISTORY: Family History  Problem Relation Age of Onset   Arthritis Mother    Cancer Father    Cancer Sister    Heart disease Maternal Uncle    Colon cancer Neg Hx     ALLERGIES:  is allergic to neomycin-bacitracin  zn-polymyx.  MEDICATIONS:  Current Outpatient Medications  Medication Sig Dispense Refill   albuterol (VENTOLIN HFA) 108 (90 Base) MCG/ACT inhaler INHALE 1 TO 2 PUFFS INTO THE LUNGS EVERY 4 HOURS AS NEEDED FOR WHEEZING OR SHORTNESS OF BREATH 18 each 2   ANORO ELLIPTA 62.5-25 MCG/INH AEPB INHALE 1 PUFF INTO THE LUNGS DAILY 60 each 1   aspirin 81 MG tablet Take 81 mg by mouth daily.     atorvastatin (LIPITOR) 40 MG tablet Take 40 mg by mouth daily.      benazepril (LOTENSIN) 20 MG tablet Take 20 mg by mouth daily.      diphenhydrAMINE (BENADRYL) 12.5 MG/5ML elixir Take 12.5 mg by mouth daily as needed for allergies.      donepezil (ARICEPT) 5 MG tablet Take 1 tablet (5 mg total) by mouth at bedtime. 30 tablet 11   folic acid (FOLVITE) 400 MCG tablet Take 400 mcg by mouth daily.     furosemide (LASIX) 40 MG tablet Take 40 mg by mouth daily.      HYDROcodone-acetaminophen (NORCO) 5-325 MG tablet Take 1/2 to 1 tablet by mouth  4 times daily as needed for pain. 60 tablet 0   ibuprofen (ADVIL,MOTRIN) 200 MG tablet Take 400 mg by mouth every 6 (six) hours as needed for headache, mild pain or moderate pain.     levothyroxine (SYNTHROID) 25 MCG tablet TAKE 1 TABLET BY MOUTH DAILY BEFORE BREAKFAST. (Patient taking differently: 75 mcg.) 90 tablet 1   memantine (NAMENDA) 10 MG tablet Take 1 tablet (10 mg total) by mouth 2 (two) times daily. 60 tablet 11   metFORMIN (GLUCOPHAGE) 500 MG tablet Take 500 mg by mouth every morning.     metoprolol succinate (TOPROL-XL) 25 MG 24 hr tablet TAKE 1 TABLET BY MOUTH DAILY. PLEASE MAKE OVERDUE APPT WITH DR. BEFORE ANYMORE REFILLS 30 tablet 0   potassium chloride SA (K-DUR) 20 MEQ tablet Take 20 mEq by mouth daily.     prochlorperazine (COMPAZINE) 10 MG tablet Take 10 mg by mouth every 6 (six) hours as needed for nausea or vomiting.      UNABLE TO FIND Take by mouth.     No current facility-administered medications for this visit.    REVIEW OF SYSTEMS:   10 Point  review of Systems was done is negative except as noted above.  PHYSICAL EXAMINATION: Vitals:   01/22/23 1338  BP: 116/81  Pulse: 95  Resp: 18  Temp: (!) 97.3 F (36.3 C)  SpO2: 98%   Wt Readings from Last 3 Encounters:  05/26/21 225 lb 6.4 oz (102.2 kg)  10/10/20 241 lb 1.6 oz (109.4 kg)  05/19/20 241 lb 1.6 oz (109.4 kg)   There is no  height or weight on file to calculate BMI.   NAD GENERAL:alert, in no acute distress and comfortable SKIN: no acute rashes, no significant lesions EYES: conjunctiva are pink and non-injected, sclera anicteric OROPHARYNX: MMM, no exudates, no oropharyngeal erythema or ulceration NECK: supple, no JVD LYMPH:  no palpable lymphadenopathy in the cervical, axillary or inguinal regions LUNGS: clear to auscultation b/l with normal respiratory effort HEART: regular rate & rhythm ABDOMEN:  normoactive bowel sounds , non tender, not distended. Extremity: no pedal edema PSYCH: alert & oriented x 3 with fluent speech NEURO: no focal motor/sensory deficits   LABORATORY DATA:  I have reviewed the data as listed    Latest Ref Rng & Units 01/22/2023    1:23 PM 10/15/2022    6:47 AM 07/23/2022   12:27 PM  CBC  WBC 4.0 - 10.5 K/uL 8.0  9.5  7.9   Hemoglobin 13.0 - 17.0 g/dL 60.4  54.0  98.1   Hematocrit 39.0 - 52.0 % 43.3  48.6  44.1   Platelets 150 - 400 K/uL 156  160  149       Latest Ref Rng & Units 10/15/2022    6:47 AM 11/10/2021    1:03 PM 05/26/2021   11:09 AM  CMP  Glucose 70 - 99 mg/dL 191  478  295   BUN 8 - 23 mg/dL 12  12  19    Creatinine 0.61 - 1.24 mg/dL 6.21  3.08  6.57   Sodium 135 - 145 mmol/L 139  142  141   Potassium 3.5 - 5.1 mmol/L 3.4  3.5  4.1   Chloride 98 - 111 mmol/L 104  106  105   CO2 22 - 32 mmol/L 26  29  26    Calcium 8.9 - 10.3 mg/dL 8.8  9.2  9.4   Total Protein 6.5 - 8.1 g/dL 8.1  7.7  7.9   Total Bilirubin 0.3 - 1.2 mg/dL 0.4  0.5  0.5   Alkaline Phos 38 - 126 U/L 62  82  80   AST 15 - 41 U/L 17  14  14    ALT 0  - 44 U/L 12  10  13      08/10/2019  08/10/2019 Cytology- PAP  03/25/18 Endobronchial bx:     RADIOGRAPHIC STUDIES: I have personally reviewed the radiological images as listed and agreed with the findings in the report.  No results found.    CT head 10/15/2022 IMPRESSION: 1. No acute intracranial abnormalities. 2. Chronic small vessel ischemic disease and brain atrophy.  CT chest 08/21/2020 IMPRESSION: Radiation changes in the right hemithorax. No findings specific for recurrent or metastatic disease. 3 mm subpleural nodule in the posterior right lower lobe, likely benign. Attention on follow-up is suggested. Small right pleural effusion, decreased. Aortic Atherosclerosis (ICD10-I70.0).    ASSESSMENT & PLAN:   77 y.o.  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  03/23/18 Brain MRI did not reveal any evidence of intracranial metastases 03/10/18 PET/CT revealed large intensely hypermetabolic Right hilar mass obstructs the Right upper lobe bronchus and surrounds the bronchus intermedus. Post obstructive collapse of the Right upper lobe. No hypermetabolic mediastinal or supraclavicular lymph nodes. No evidence of distant mets.  06/23/18 PET/CT revealed  Marked reduction in activity associated with the dominant right upper lobe central mass, maximum SUV 3.4 and previously 15.3. Please note  that this lesion does still in case and markedly narrows the right upper lobe bronchus. 2. There is some patchy ground-glass opacities with small airspace opacity components in the right upper lobe, right middle lobe, right lower lobe, and lingula. Some of these are hypermetabolic, for example lingular opacity has a maximum SUV of 6.2 and the right lower lobe bandlike opacity 5.7. These are probably inflammatory and merit  surveillance. 3. Small to moderate right pleural effusion with accentuated metabolic activity. Malignant effusion not excluded. 4. No findings of metastatic disease to the neck, abdomen/pelvis, or skeleton. 5. Aortic aneurysm NOS. Infrarenal aortic aneurysm 3.8 cm in diameter. Recommend followup by [place] in 2 years. 6. Other imaging findings of potential clinical significance: Chronic paranasal sinusitis. Aortic Atherosclerosis. Coronary atherosclerosis. Hepatic cirrhosis. Ventral hernia containing a loop of small bowel. Suspected late phase healing of the right anterior eighth rib.   09/18/18 CT Chest revealed Rarely relatively stable perihilar consolidation on the RIGHT consistent post treatment change. Persistent narrowing of the proximal bronchi. 2. Interval increase in interstitial thickening in the RIGHT upper lobe. Differential include postobstructive process versus lymphangitic carcinoma. Favor postobstructive process. 3. Interval increase in RIGHT pleural effusion. 4. With potential progressive malignant findings versus progressive benign change, consider follow-up FDG PET scan.   10/02/18 Cytology from thoracentesis showed no malignant cells. 10/02/18 CXR revealed No evidence of pneumothorax after RIGHT thoracentesis. 2. Persistent large, loculated RIGHT pleural effusion and associated dense passive atelectasis in the RIGHT LOWER LOBE. 3. No new abnormalities.  10/29/18 CT Chest revealed Interval decrease in right pleural effusion with residual loculated component seen anteriorly and medially. 2. The interstitial and airspace disease in the posterior right upper lung is similar to prior. 3. Stable volume loss right hemithorax with right parahilar interstitial and airspace disease likely reflecting radiation fibrosis. 4. Nodular liver morphology suggests cirrhosis.  S/p 5 cycles of Durvalumab  03/06/19 CT Chest revealed "Stable appearing extensive radiation changes and treated tumor in the right upper lobe  and right paramediastinal lung and right hilum. I do not see any definite CT findings for progressive disease. 2. Stable loculated right pleural fluid collections. 3. No CT findings to suggest metastatic pulmonary nodules in the left lung or metastatic disease involving the upper abdomen. 4. Stable cirrhotic changes involving the liver. Aortic Atherosclerosis and Emphysema."  11/24/18 ECHO revealed LV EF of 50-55%  12/31/19 CT C/A/P  ([id number]) ([id number]) revealed "1. Stable appearance of treated tumor within the right perihilar and perihilar right lung. No specific findings identified to suggest local tumor recurrence. 2. Decrease in size of loculated right pleural effusion. 3. Cirrhosis. 4. Infrarenal abdominal aortic aneurysm."  2) h/o RUL airway obstruction with RUL lung collapse.  3) previous Concern for severe narrowing of the right main pulmonary artery as well as right upper lobe and right middle lobe arteries.   4.  Hypothyroidism - likely related to Durvalumab -On levothyroxine    PLAN:  -Discussed his labs today and were stable. -He continues to have generalized decline in his overall functional status and cognitive decline as well. He has had a history of heavy alcohol use in the past and multiple vascular risk factors. He has needed increasing amounts of help at home and has been unable to care for himself. -Discussed with patient, his wife, and his daughter that he might need additional help at home and they should approach his primary care physician for referral for home safety evaluation by home health nurse.  -Continue close follow-up with  primary care physician for continued management of other medical issues including hypothyroidism, hypertension and diabetes CHF COPD etc. -Discussed all his options about his long-term care: home care evaluation, hospice, and palliative care with the patient, his wife, and his daughter.  -Again recommended following up with PCP for chronic  management- he might be a candidate for home hospice  cares due to dementia with severe debility. -patient and his family are not inclined to pursue imaging or other evaluation for lung cancer progression at this time.  FOLLOW UP: F/u with PCP   The total time spent in the appointment was 30 minutes* .  All of the patient's questions were answered with apparent satisfaction. The patient knows to call the clinic with any problems, questions or concerns.   Wyvonnia Lora MD MS AAHIVMS [facility] Hematology/Oncology Physician [facility]  *Total Encounter Time as defined by the Centers for Medicare and Medicaid Services includes, in addition to the face-to-face time of a patient visit (documented in the note above) non-face-to-face time: obtaining and reviewing outside history, ordering and reviewing medications, tests or procedures, care coordination (communications with other health care professionals or caregivers) and documentation in the medical record.   I,Alexis Herring,acting as a Neurosurgeon for Wyvonnia Lora, MD.,have documented all relevant documentation on the behalf of Wyvonnia Lora, MD,as directed by  Wyvonnia Lora, MD while in the presence of Wyvonnia Lora, MD.  .I have reviewed the above documentation for accuracy and completeness, and I agree with the above. Johney Maine MD

## 2023-03-06 ENCOUNTER — Other Ambulatory Visit: Payer: Self-pay | Admitting: Hematology

## 2023-03-06 DIAGNOSIS — C3490 Malignant neoplasm of unspecified part of unspecified bronchus or lung: Secondary | ICD-10-CM

## 2023-03-11 ENCOUNTER — Ambulatory Visit: Payer: Medicare HMO | Admitting: Podiatry

## 2023-06-02 DEATH — deceased

## 2023-12-02 ENCOUNTER — Ambulatory Visit: Payer: Self-pay | Admitting: Neurology
